# Patient Record
Sex: Female | Born: 1990 | Race: Black or African American | Hispanic: No | Marital: Single | Smoking: Former smoker
Health system: Southern US, Community
[De-identification: ages and names within clinical notes are randomized; demographics above are authoritative.]

## PROBLEM LIST (undated history)

## (undated) ENCOUNTER — Emergency Department (HOSPITAL_COMMUNITY): Admission: EM | Payer: Medicaid Other

## (undated) ENCOUNTER — Inpatient Hospital Stay (HOSPITAL_COMMUNITY): Payer: Self-pay

## (undated) DIAGNOSIS — B977 Papillomavirus as the cause of diseases classified elsewhere: Secondary | ICD-10-CM

## (undated) DIAGNOSIS — K219 Gastro-esophageal reflux disease without esophagitis: Secondary | ICD-10-CM

## (undated) DIAGNOSIS — O139 Gestational [pregnancy-induced] hypertension without significant proteinuria, unspecified trimester: Secondary | ICD-10-CM

## (undated) DIAGNOSIS — R51 Headache: Secondary | ICD-10-CM

## (undated) DIAGNOSIS — F32A Depression, unspecified: Secondary | ICD-10-CM

## (undated) DIAGNOSIS — F419 Anxiety disorder, unspecified: Secondary | ICD-10-CM

## (undated) DIAGNOSIS — R87619 Unspecified abnormal cytological findings in specimens from cervix uteri: Secondary | ICD-10-CM

## (undated) DIAGNOSIS — R87629 Unspecified abnormal cytological findings in specimens from vagina: Secondary | ICD-10-CM

## (undated) DIAGNOSIS — N390 Urinary tract infection, site not specified: Secondary | ICD-10-CM

## (undated) DIAGNOSIS — O24419 Gestational diabetes mellitus in pregnancy, unspecified control: Secondary | ICD-10-CM

## (undated) DIAGNOSIS — D649 Anemia, unspecified: Secondary | ICD-10-CM

## (undated) DIAGNOSIS — IMO0002 Reserved for concepts with insufficient information to code with codable children: Secondary | ICD-10-CM

## (undated) DIAGNOSIS — A599 Trichomoniasis, unspecified: Secondary | ICD-10-CM

## (undated) DIAGNOSIS — F329 Major depressive disorder, single episode, unspecified: Secondary | ICD-10-CM

## (undated) HISTORY — DX: Gestational diabetes mellitus in pregnancy, unspecified control: O24.419

## (undated) HISTORY — DX: Anemia, unspecified: D64.9

## (undated) HISTORY — DX: Anxiety disorder, unspecified: F41.9

---
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
## 2011-01-05 LAB — WET PREP, GENITAL

## 2011-01-06 LAB — URINALYSIS, ROUTINE W REFLEX MICROSCOPIC
Nitrite: NEGATIVE
Specific Gravity, Urine: >1.03 — ABNORMAL HIGH (ref 1.005–1.030)
Urobilinogen, UA: 0.2 mg/dL (ref 0.0–1.0)
pH: 5.5 (ref 5.0–8.0)

## 2011-01-06 LAB — URINE CULTURE

## 2011-01-06 LAB — POCT PREGNANCY, URINE: Preg Test, Ur: POSITIVE

## 2011-01-06 LAB — URINE MICROSCOPIC-ADD ON

## 2011-01-06 LAB — HCG, QUANTITATIVE, PREGNANCY: hCG, Beta Chain, Quant, S: 8566 m[IU]/mL — ABNORMAL HIGH (ref <5)

## 2011-01-06 LAB — WET PREP, GENITAL

## 2011-01-26 ENCOUNTER — Inpatient Hospital Stay (HOSPITAL_COMMUNITY)
Admission: AD | Admit: 2011-01-26 | Discharge: 2011-01-26 | Disposition: A | Discharge status: Home / Self Care | Payer: Medicaid Other | Source: Ambulatory Visit | Attending: Obstetrics and Gynecology | Admitting: Obstetrics and Gynecology

## 2011-01-26 DIAGNOSIS — O98819 Other maternal infectious and parasitic diseases complicating pregnancy, unspecified trimester: Secondary | ICD-10-CM

## 2011-01-26 DIAGNOSIS — G43909 Migraine, unspecified, not intractable, without status migrainosus: Secondary | ICD-10-CM | POA: Insufficient documentation

## 2011-01-26 DIAGNOSIS — A5901 Trichomonal vulvovaginitis: Secondary | ICD-10-CM | POA: Insufficient documentation

## 2011-01-26 LAB — URINALYSIS, ROUTINE W REFLEX MICROSCOPIC
Bilirubin Urine: NEGATIVE
Glucose, UA: NEGATIVE mg/dL
Ketones, ur: NEGATIVE mg/dL
Nitrite: NEGATIVE
Specific Gravity, Urine: 1.025 (ref 1.005–1.030)
pH: 6.5 (ref 5.0–8.0)

## 2011-01-26 LAB — URINE MICROSCOPIC-ADD ON

## 2011-01-27 ENCOUNTER — Other Ambulatory Visit (HOSPITAL_COMMUNITY): Payer: Self-pay | Admitting: Obstetrics and Gynecology

## 2011-01-27 DIAGNOSIS — Z3689 Encounter for other specified antenatal screening: Secondary | ICD-10-CM

## 2011-01-27 LAB — URINE CULTURE

## 2011-02-02 ENCOUNTER — Ambulatory Visit (HOSPITAL_COMMUNITY): Payer: Medicaid Other

## 2011-02-08 ENCOUNTER — Ambulatory Visit (HOSPITAL_COMMUNITY): Payer: Medicaid Other | Attending: Obstetrics and Gynecology
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
## 2011-02-19 NOTE — Group Therapy Note (Signed)
Kristin Powers, MIU NO.:  [id number]   MEDICAL RECORD NO.:  [id number]          PATIENT TYPE:  WOC   LOCATION:  [facility]                   FACILITY:  [facility]   PHYSICIAN:  Ellis Parents, MD    DATE OF BIRTH:  05/28/1991   DATE OF SERVICE:  02/09/2006                                    CLINIC NOTE   This 20 year old nulliparous female presents with abnormal uterine bleeding  approximately one month duration.  The patient was started on Depo-Provera  approximately three months ago because of progressive dysmenorrhea, although  her menses were irregular.  To date she has received three injections of  Depo, the last one being mid February 2007.  She began bleeding a month ago  and was put on an OC and is still bleeding using one or two pads a day.  The  patient has also had some crampy mid abdominal pains associated with nausea,  vomiting, and occasional diarrhea.  The patient denies any fever and she is  sexually active.   PAST MEDICAL HISTORY:  Patient denies any previous history of surgery or  allergies.  Past medical history of GERD.   PHYSICAL EXAMINATION:  ABDOMEN:  Soft and nontender.  PELVIC:  External genitalia are normal.  The vagina contains a small amount  of blood.  The cervix is nulliparous and clean.  The uterus is in mid  position, normal in size, nontender.  Both adnexa soft without tenderness,  masses, nodularity, or induration.   Abnormal uterine bleeding secondary to Provera and OC.  The patient is to  discontinue the OC and start on Premarin 0.6 mg daily.  She is given 45.  The patient's mother was advised to continue the Premarin until the bleeding  stops and the patient can start on OCs if desired for contraception.  A  GC/Chlamydia culture is obtained.           ______________________________  Ellis Parents, MD     SA/MEDQ  D:  02/09/2006  T:  02/10/2006  Job:  [id number]

## 2011-03-06 ENCOUNTER — Inpatient Hospital Stay (HOSPITAL_COMMUNITY)
Admission: EM | Admit: 2011-03-06 | Discharge: 2011-03-06 | Disposition: A | Discharge status: Home / Self Care | Payer: Medicaid Other | Source: Ambulatory Visit | Attending: Obstetrics and Gynecology | Admitting: Obstetrics and Gynecology

## 2011-03-06 DIAGNOSIS — O2390 Unspecified genitourinary tract infection in pregnancy, unspecified trimester: Secondary | ICD-10-CM | POA: Insufficient documentation

## 2011-03-06 DIAGNOSIS — O4700 False labor before 37 completed weeks of gestation, unspecified trimester: Secondary | ICD-10-CM

## 2011-03-06 DIAGNOSIS — N390 Urinary tract infection, site not specified: Secondary | ICD-10-CM

## 2011-03-06 LAB — URINALYSIS, ROUTINE W REFLEX MICROSCOPIC
Nitrite: NEGATIVE
Specific Gravity, Urine: 1.02 (ref 1.005–1.030)
Urobilinogen, UA: 0.2 mg/dL (ref 0.0–1.0)
pH: 7 (ref 5.0–8.0)

## 2011-03-06 LAB — URINE MICROSCOPIC-ADD ON

## 2011-03-07 LAB — RH IMMUNE GLOBULIN WORKUP (NOT WOMEN'S HOSP)
ABO/RH(D): O NEG
Antibody Screen: NEGATIVE
Unit division: 0

## 2011-03-07 LAB — URINE CULTURE

## 2011-03-08 LAB — STREP B DNA PROBE: Strep Group B Ag: NEGATIVE

## 2011-03-08 LAB — GC/CHLAMYDIA PROBE AMP, GENITAL: GC Probe Amp, Genital: NEGATIVE

## 2011-03-19 ENCOUNTER — Inpatient Hospital Stay (HOSPITAL_COMMUNITY)
Admission: AD | Admit: 2011-03-19 | Discharge: 2011-03-19 | Disposition: A | Discharge status: Home / Self Care | Payer: Medicaid Other | Source: Ambulatory Visit | Attending: Obstetrics & Gynecology | Admitting: Obstetrics & Gynecology

## 2011-03-19 ENCOUNTER — Inpatient Hospital Stay (HOSPITAL_COMMUNITY): Payer: Medicaid Other

## 2011-03-19 DIAGNOSIS — O479 False labor, unspecified: Secondary | ICD-10-CM

## 2011-03-19 DIAGNOSIS — N390 Urinary tract infection, site not specified: Secondary | ICD-10-CM

## 2011-03-19 DIAGNOSIS — O2390 Unspecified genitourinary tract infection in pregnancy, unspecified trimester: Secondary | ICD-10-CM | POA: Insufficient documentation

## 2011-03-19 LAB — URINE MICROSCOPIC-ADD ON

## 2011-03-19 LAB — WET PREP, GENITAL
Clue Cells Wet Prep HPF POC: NONE SEEN
Trich, Wet Prep: NONE SEEN
Yeast Wet Prep HPF POC: NONE SEEN

## 2011-03-19 LAB — URINALYSIS, ROUTINE W REFLEX MICROSCOPIC
Glucose, UA: NEGATIVE mg/dL
Hgb urine dipstick: NEGATIVE
Ketones, ur: NEGATIVE mg/dL
Protein, ur: NEGATIVE mg/dL
Urobilinogen, UA: 0.2 mg/dL (ref 0.0–1.0)

## 2011-03-20 LAB — URINE CULTURE: Culture: NO GROWTH

## 2011-03-20 LAB — GC/CHLAMYDIA PROBE AMP, GENITAL: GC Probe Amp, Genital: NEGATIVE

## 2011-03-22 ENCOUNTER — Inpatient Hospital Stay (HOSPITAL_COMMUNITY)
Admission: AD | Admit: 2011-03-22 | Discharge: 2011-03-22 | Disposition: A | Discharge status: Home / Self Care | Payer: Medicaid Other | Source: Ambulatory Visit | Attending: Obstetrics & Gynecology | Admitting: Obstetrics & Gynecology

## 2011-03-22 DIAGNOSIS — O368390 Maternal care for abnormalities of the fetal heart rate or rhythm, unspecified trimester, not applicable or unspecified: Secondary | ICD-10-CM | POA: Insufficient documentation

## 2011-03-24 ENCOUNTER — Other Ambulatory Visit: Payer: Self-pay | Admitting: Obstetrics and Gynecology

## 2011-03-24 DIAGNOSIS — O365990 Maternal care for other known or suspected poor fetal growth, unspecified trimester, not applicable or unspecified: Secondary | ICD-10-CM

## 2011-03-24 DIAGNOSIS — O0930 Supervision of pregnancy with insufficient antenatal care, unspecified trimester: Secondary | ICD-10-CM

## 2011-03-24 DIAGNOSIS — O99340 Other mental disorders complicating pregnancy, unspecified trimester: Secondary | ICD-10-CM

## 2011-03-24 LAB — POCT URINALYSIS DIP (DEVICE)
Bilirubin Urine: NEGATIVE
Glucose, UA: NEGATIVE mg/dL
Ketones, ur: NEGATIVE mg/dL
Protein, ur: 30 mg/dL — AB
Specific Gravity, Urine: 1.02 (ref 1.005–1.030)

## 2011-03-31 ENCOUNTER — Other Ambulatory Visit: Payer: Self-pay | Admitting: Family Medicine

## 2011-03-31 ENCOUNTER — Ambulatory Visit (HOSPITAL_COMMUNITY)
Admission: RE | Admit: 2011-03-31 | Discharge: 2011-03-31 | Disposition: A | Discharge status: Home / Self Care | Payer: Medicaid Other | Source: Ambulatory Visit | Attending: Obstetrics and Gynecology | Admitting: Obstetrics and Gynecology

## 2011-03-31 ENCOUNTER — Other Ambulatory Visit: Payer: Self-pay | Admitting: Obstetrics and Gynecology

## 2011-03-31 DIAGNOSIS — O09299 Supervision of pregnancy with other poor reproductive or obstetric history, unspecified trimester: Secondary | ICD-10-CM | POA: Insufficient documentation

## 2011-03-31 DIAGNOSIS — O365990 Maternal care for other known or suspected poor fetal growth, unspecified trimester, not applicable or unspecified: Secondary | ICD-10-CM

## 2011-03-31 DIAGNOSIS — O0930 Supervision of pregnancy with insufficient antenatal care, unspecified trimester: Secondary | ICD-10-CM

## 2011-03-31 LAB — POCT URINALYSIS DIP (DEVICE)
Glucose, UA: NEGATIVE mg/dL
Specific Gravity, Urine: 1.025 (ref 1.005–1.030)
Urobilinogen, UA: 0.2 mg/dL (ref 0.0–1.0)

## 2011-04-04 ENCOUNTER — Inpatient Hospital Stay (HOSPITAL_COMMUNITY): Admission: AD | Admit: 2011-04-04 | Payer: Self-pay | Source: Ambulatory Visit | Admitting: Obstetrics & Gynecology

## 2011-04-05 ENCOUNTER — Ambulatory Visit (HOSPITAL_COMMUNITY)
Admission: RE | Admit: 2011-04-05 | Discharge: 2011-04-05 | Disposition: A | Discharge status: Home / Self Care | Payer: Medicaid Other | Source: Ambulatory Visit | Attending: Family Medicine | Admitting: Family Medicine

## 2011-04-05 ENCOUNTER — Other Ambulatory Visit: Payer: Self-pay | Admitting: Obstetrics and Gynecology

## 2011-04-05 DIAGNOSIS — O0930 Supervision of pregnancy with insufficient antenatal care, unspecified trimester: Secondary | ICD-10-CM | POA: Insufficient documentation

## 2011-04-05 DIAGNOSIS — O365990 Maternal care for other known or suspected poor fetal growth, unspecified trimester, not applicable or unspecified: Secondary | ICD-10-CM

## 2011-04-05 DIAGNOSIS — O09299 Supervision of pregnancy with other poor reproductive or obstetric history, unspecified trimester: Secondary | ICD-10-CM | POA: Insufficient documentation

## 2011-04-05 LAB — POCT URINALYSIS DIP (DEVICE)
Bilirubin Urine: NEGATIVE
Glucose, UA: NEGATIVE mg/dL
Hgb urine dipstick: NEGATIVE
Ketones, ur: NEGATIVE mg/dL
Nitrite: NEGATIVE
pH: 6 (ref 5.0–8.0)

## 2011-04-07 ENCOUNTER — Inpatient Hospital Stay (HOSPITAL_COMMUNITY)
Admission: RE | Admit: 2011-04-07 | Discharge: 2011-04-09 | DRG: 775 | Disposition: A | Discharge status: Home / Self Care | Payer: Medicaid Other | Source: Ambulatory Visit | Attending: Family Medicine | Admitting: Family Medicine

## 2011-04-07 ENCOUNTER — Inpatient Hospital Stay (HOSPITAL_COMMUNITY): Payer: Medicaid Other | Admitting: Family Medicine

## 2011-04-07 DIAGNOSIS — Z22330 Carrier of Group B streptococcus: Secondary | ICD-10-CM

## 2011-04-07 DIAGNOSIS — O99892 Other specified diseases and conditions complicating childbirth: Secondary | ICD-10-CM | POA: Diagnosis present

## 2011-04-07 DIAGNOSIS — O9989 Other specified diseases and conditions complicating pregnancy, childbirth and the puerperium: Secondary | ICD-10-CM

## 2011-04-07 LAB — CBC
MCHC: 32.5 g/dL (ref 30.0–36.0)
MCV: 86.4 fL (ref 78.0–100.0)
Platelets: 189 10*3/uL (ref 150–400)
RBC: 3.81 MIL/uL — ABNORMAL LOW (ref 3.87–5.11)
RDW: 13.9 % (ref 11.5–15.5)

## 2011-04-11 ENCOUNTER — Inpatient Hospital Stay (HOSPITAL_COMMUNITY)
Admission: AD | Admit: 2011-04-11 | Discharge: 2011-04-11 | Disposition: A | Discharge status: Home / Self Care | Payer: Medicaid Other | Source: Ambulatory Visit | Attending: Obstetrics & Gynecology | Admitting: Obstetrics & Gynecology

## 2011-04-11 ENCOUNTER — Inpatient Hospital Stay (HOSPITAL_COMMUNITY): Payer: Medicaid Other | Admitting: Obstetrics & Gynecology

## 2011-04-11 DIAGNOSIS — Z2989 Encounter for other specified prophylactic measures: Secondary | ICD-10-CM | POA: Insufficient documentation

## 2011-04-11 DIAGNOSIS — IMO0002 Reserved for concepts with insufficient information to code with codable children: Secondary | ICD-10-CM

## 2011-04-11 DIAGNOSIS — Z298 Encounter for other specified prophylactic measures: Secondary | ICD-10-CM | POA: Insufficient documentation

## 2011-04-11 MED ORDER — RHO D IMMUNE GLOBULIN 1500 UNIT/2ML IJ SOLN
300.0000 ug | Freq: Once | INTRAMUSCULAR | Status: AC
  Administered 2011-04-11: 300 ug via INTRAMUSCULAR

## 2011-04-11 NOTE — Progress Notes (Signed)
Patient was discharge from [facility] without receiving Rhophylac injection, presented today for injection

## 2011-04-12 LAB — RH IMMUNE GLOB WKUP(>/=20WKS)(NOT WOMEN'S HOSP): Fetal Screen: NEGATIVE

## 2011-04-26 ENCOUNTER — Inpatient Hospital Stay (HOSPITAL_COMMUNITY)
Admission: AD | Admit: 2011-04-26 | Discharge: 2011-04-26 | Disposition: A | Discharge status: Home / Self Care | Payer: Medicaid Other | Source: Ambulatory Visit | Attending: Obstetrics & Gynecology | Admitting: Obstetrics & Gynecology

## 2011-04-26 ENCOUNTER — Encounter (HOSPITAL_COMMUNITY): Payer: Self-pay | Admitting: *Deleted

## 2011-04-26 DIAGNOSIS — G43109 Migraine with aura, not intractable, without status migrainosus: Secondary | ICD-10-CM | POA: Insufficient documentation

## 2011-04-26 DIAGNOSIS — F3289 Other specified depressive episodes: Secondary | ICD-10-CM | POA: Insufficient documentation

## 2011-04-26 DIAGNOSIS — O99345 Other mental disorders complicating the puerperium: Secondary | ICD-10-CM

## 2011-04-26 DIAGNOSIS — O9989 Other specified diseases and conditions complicating pregnancy, childbirth and the puerperium: Secondary | ICD-10-CM

## 2011-04-26 DIAGNOSIS — O99893 Other specified diseases and conditions complicating puerperium: Secondary | ICD-10-CM | POA: Insufficient documentation

## 2011-04-26 DIAGNOSIS — F329 Major depressive disorder, single episode, unspecified: Secondary | ICD-10-CM

## 2011-04-26 HISTORY — DX: Major depressive disorder, single episode, unspecified: F32.9

## 2011-04-26 HISTORY — DX: Unspecified abnormal cytological findings in specimens from cervix uteri: R87.619

## 2011-04-26 HISTORY — DX: Trichomoniasis, unspecified: A59.9

## 2011-04-26 HISTORY — DX: Gestational (pregnancy-induced) hypertension without significant proteinuria, unspecified trimester: O13.9

## 2011-04-26 HISTORY — DX: Headache: R51

## 2011-04-26 HISTORY — DX: Papillomavirus as the cause of diseases classified elsewhere: B97.7

## 2011-04-26 HISTORY — DX: Depression, unspecified: F32.A

## 2011-04-26 HISTORY — DX: Reserved for concepts with insufficient information to code with codable children: IMO0002

## 2011-04-26 HISTORY — DX: Urinary tract infection, site not specified: N39.0

## 2011-04-26 MED ORDER — BUTALBITAL-APAP-CAFFEINE 50-325-40 MG PO TABS
2.0000 | ORAL_TABLET | Freq: Once | ORAL | Status: AC
  Administered 2011-04-26: 2 via ORAL
  Filled 2011-04-26: qty 2

## 2011-04-26 MED ORDER — BUPROPION HCL 100 MG PO TABS: ORAL_TABLET | ORAL | Status: DC

## 2011-04-26 MED ORDER — BUTALBITAL-APAP-CAFFEINE 50-325-40 MG PO TABS: 1.0000 | ORAL_TABLET | Freq: Four times a day (QID) | ORAL | Status: DC | PRN

## 2011-04-26 NOTE — ED Provider Notes (Signed)
History   The pt is a 20 year-old G2P2 19 days PP who presents to [facility] reporting PP depression not relieved by Zoloft. She ran out of her medication a week ago and stopped without tapering. She denies SI/HI or any significant exacerbation of Sx since quitting, but reports depression, short temper and "bad feelings" toward her older child and repetitive behaviors including twisting/pulling at her hair "picking" at her eyes.   Chief Complaint  Patient presents with  . Depression   HPI  Past Medical History  Diagnosis Date  . Pregnancy induced hypertension   . Depression   . Headache   . Asthma   . Urinary tract infection   . Abnormal Pap smear   . Human papilloma virus   . Trichomonas     Past Surgical History  Procedure Date  . No past surgeries     No family history on file.  History  Substance Use Topics  . Smoking status: Former Smoker    Types: Cigarettes    Quit date: 07/26/2010  . Smokeless tobacco: Never Used  . Alcohol Use: No    OB History    Grav Para Term Preterm Abortions TAB SAB Ect Mult Living   2 2 1 1  0 0 0 0 0 2      Review of Systems  Constitutional: Negative.   Eyes: Negative for visual disturbance.  Gastrointestinal: Negative for abdominal pain.  Neurological: Positive for headaches (chronic H/A's--dulll, constant, R temporal assoc w/ nausea, sensitivitie to stimulation and aura. More frequent since delivery. No resolution w/ ibuprofen).  Psychiatric/Behavioral: Negative for suicidal ideas and self-injury. The patient is nervous/anxious.     Physical Exam  BP 125/69  Pulse 65  Temp(Src) 98.1 F (36.7 C) (Oral)  Resp 18  Ht 5\' 2"  (1.575 m)  Wt 79.107 kg (174 lb 6.4 oz)  BMI 31.90 kg/m2  LMP 07/06/2010  Breastfeeding? Unknown  Physical Exam  Constitutional: She appears well-developed and well-nourished. She appears distressed (tearful).  Cardiovascular: Normal rate.   Pulmonary/Chest: Effort normal.  Neurological: She has normal  reflexes.  Skin: Skin is warm and dry.  Psychiatric: Thought content is not paranoid and not delusional. She exhibits a depressed mood. She expresses no homicidal and no suicidal ideation. She expresses no suicidal plans and no homicidal plans.    ED Course  Procedures  Assessment: 1. PP Depression not well-controlled w/ Zoloft 2. Migraines w/ aura  Plan: 1. D/C home 2. 2 Fiorcet now, Rx given 3. Rx Wellbutrin 100 BID x 3 days , then TID, #90 w. 1 RF 4. F/U w/ Family Medicine. Contact info given. 5, Support given 6. Discussed support groups, tapping social support, [facility] for emergency mental health services

## 2011-04-26 NOTE — ED Notes (Signed)
Had doubled up on zoloft (started in hosp after del).  Has taken in a wk because it wasn't working.

## 2011-04-26 NOTE — ED Notes (Signed)
Does not have help at home.  FOB not supportive or involved.  Denies family support, states really doesn't have family.  Discussed availability of baby blue, mom support group through hosp- pamphlet given.  States does not feel she would harm herself or the children

## 2011-04-26 NOTE — Progress Notes (Signed)
Suffering from p.p depression, states zoloft is not working.

## 2011-06-01 ENCOUNTER — Other Ambulatory Visit: Payer: Self-pay | Admitting: Family

## 2011-06-25 LAB — CLOSTRIDIUM DIFFICILE EIA

## 2011-06-25 LAB — URINE CULTURE: Colony Count: >=100000

## 2011-06-25 LAB — STOOL CULTURE

## 2011-06-25 LAB — URINE MICROSCOPIC-ADD ON

## 2011-06-25 LAB — URINALYSIS, ROUTINE W REFLEX MICROSCOPIC
Ketones, ur: 15 — AB
Nitrite: NEGATIVE
pH: 5.5

## 2011-06-25 LAB — GIARDIA/CRYPTOSPORIDIUM SCREEN(EIA): Giardia Screen - EIA: NEGATIVE

## 2011-06-28 LAB — URINALYSIS, ROUTINE W REFLEX MICROSCOPIC
Bilirubin Urine: NEGATIVE
Glucose, UA: NEGATIVE
Ketones, ur: NEGATIVE
Nitrite: NEGATIVE
Protein, ur: 30 — AB
Specific Gravity, Urine: 1.026
Urobilinogen, UA: 0.2
pH: 6

## 2011-06-28 LAB — URINE MICROSCOPIC-ADD ON

## 2011-06-28 LAB — URINE CULTURE

## 2011-06-28 LAB — WET PREP, GENITAL
Trich, Wet Prep: NONE SEEN
Yeast Wet Prep HPF POC: NONE SEEN

## 2011-07-01 ENCOUNTER — Emergency Department (HOSPITAL_COMMUNITY)
Admission: EM | Admit: 2011-07-01 | Discharge: 2011-07-01 | Discharge status: Against Medical Advice | Payer: Medicaid Other | Attending: Emergency Medicine | Admitting: Emergency Medicine

## 2011-07-01 DIAGNOSIS — R109 Unspecified abdominal pain: Secondary | ICD-10-CM | POA: Insufficient documentation

## 2011-07-16 LAB — GC/CHLAMYDIA PROBE AMP, GENITAL: Chlamydia, DNA Probe: NEGATIVE

## 2011-07-16 LAB — POCT PREGNANCY, URINE: Preg Test, Ur: NEGATIVE

## 2011-07-16 LAB — WET PREP, GENITAL
Trich, Wet Prep: NONE SEEN
Yeast Wet Prep HPF POC: NONE SEEN

## 2011-07-19 LAB — URINALYSIS, ROUTINE W REFLEX MICROSCOPIC
Leukocytes, UA: NEGATIVE
Nitrite: NEGATIVE
Specific Gravity, Urine: 1.021
Urobilinogen, UA: 1

## 2011-07-19 LAB — TSH: TSH: 2.232

## 2011-07-19 LAB — DRUGS OF ABUSE SCREEN W/O ALC, ROUTINE URINE
Amphetamine Screen, Ur: NEGATIVE
Benzodiazepines.: NEGATIVE
Creatinine,U: 153
Marijuana Metabolite: POSITIVE — AB
Propoxyphene: NEGATIVE

## 2011-07-19 LAB — DIFFERENTIAL
Basophils Absolute: 0.1
Lymphocytes Relative: 29 — ABNORMAL LOW
Lymphs Abs: 2.4
Neutrophils Relative %: 63

## 2011-07-19 LAB — HEPATIC FUNCTION PANEL
ALT: 18
Alkaline Phosphatase: 63
Bilirubin, Direct: <0.1
Total Bilirubin: 0.6

## 2011-07-19 LAB — GAMMA GT: GGT: 35

## 2011-07-19 LAB — URINE MICROSCOPIC-ADD ON

## 2011-07-19 LAB — PREGNANCY, URINE: Preg Test, Ur: NEGATIVE

## 2011-07-19 LAB — CBC
Platelets: 342
RDW: 12.9
WBC: 8.4

## 2011-07-19 LAB — T4, FREE: Free T4: 0.98

## 2011-08-05 NOTE — Telephone Encounter (Signed)
Called in to pharmacy on 08/05/11

## 2011-10-15 ENCOUNTER — Encounter (HOSPITAL_COMMUNITY): Payer: Self-pay | Admitting: *Deleted

## 2011-10-15 ENCOUNTER — Emergency Department (HOSPITAL_COMMUNITY)
Admission: EM | Admit: 2011-10-15 | Discharge: 2011-10-16 | Disposition: A | Discharge status: Home / Self Care | Payer: Self-pay | Attending: Emergency Medicine | Admitting: Emergency Medicine

## 2011-10-15 DIAGNOSIS — B9689 Other specified bacterial agents as the cause of diseases classified elsewhere: Secondary | ICD-10-CM | POA: Insufficient documentation

## 2011-10-15 DIAGNOSIS — O2390 Unspecified genitourinary tract infection in pregnancy, unspecified trimester: Secondary | ICD-10-CM | POA: Insufficient documentation

## 2011-10-15 DIAGNOSIS — O219 Vomiting of pregnancy, unspecified: Secondary | ICD-10-CM

## 2011-10-15 DIAGNOSIS — J3489 Other specified disorders of nose and nasal sinuses: Secondary | ICD-10-CM | POA: Insufficient documentation

## 2011-10-15 DIAGNOSIS — N760 Acute vaginitis: Secondary | ICD-10-CM | POA: Insufficient documentation

## 2011-10-15 DIAGNOSIS — J45909 Unspecified asthma, uncomplicated: Secondary | ICD-10-CM | POA: Insufficient documentation

## 2011-10-15 DIAGNOSIS — Z3201 Encounter for pregnancy test, result positive: Secondary | ICD-10-CM | POA: Insufficient documentation

## 2011-10-15 DIAGNOSIS — Z331 Pregnant state, incidental: Secondary | ICD-10-CM

## 2011-10-15 DIAGNOSIS — O210 Mild hyperemesis gravidarum: Secondary | ICD-10-CM | POA: Insufficient documentation

## 2011-10-15 DIAGNOSIS — A499 Bacterial infection, unspecified: Secondary | ICD-10-CM | POA: Insufficient documentation

## 2011-10-15 LAB — PREGNANCY, URINE: Preg Test, Ur: POSITIVE

## 2011-10-15 MED ORDER — IBUPROFEN 800 MG PO TABS
800.0000 mg | ORAL_TABLET | Freq: Once | ORAL | Status: AC
  Administered 2011-10-15: 800 mg via ORAL
  Filled 2011-10-15: qty 1

## 2011-10-15 MED ORDER — ONDANSETRON 4 MG PO TBDP
8.0000 mg | ORAL_TABLET | Freq: Once | ORAL | Status: AC
  Administered 2011-10-15: 8 mg via ORAL
  Filled 2011-10-15: qty 2

## 2011-10-15 NOTE — ED Notes (Signed)
Patient states started vomiting today and it happens every 20 minutes. States had an episode of the same 2 weeks ago and thought it was a 24 hour virus but it is back. A&Ox3 Resp even unlabored and relaxed.

## 2011-10-15 NOTE — ED Notes (Signed)
She has had a cold and chest congestion for 2-3 weeks intermittently.  She has sinus congestion also.  C/o vomiting and weakness

## 2011-10-15 NOTE — ED Provider Notes (Signed)
History     CSN: [id number]  Arrival date & time 10/15/11  2129   First MD Initiated Contact with Patient 10/15/11 2156      Chief Complaint  Patient presents with  . Emesis    (Consider location/radiation/quality/duration/timing/severity/associated sxs/prior treatment) HPI Comments: Patient reports she has had nausea and vomiting every 20 minutes today.  Thinks she may be pregnant.  States she has had nasal congestion and a "head cold" that began yesterday.  She had vomiting a few days ago, and today has had vomiting all day.  LMP was sometime last month and she has stopped using her birth control.  Pt is G2P2002.  States she has had abnormal malodorous vaginal discharge x 1 month.  Denies abdominal pain, vaginal bleeding.    Patient is a 21 y.o. female presenting with vomiting. The history is provided by the patient.  Emesis  Pertinent negatives include no abdominal pain, no diarrhea, no fever and no headaches.    Past Medical History  Diagnosis Date  . Pregnancy induced hypertension   . Depression   . Headache   . Asthma   . Urinary tract infection   . Abnormal Pap smear   . Human papilloma virus   . Trichomonas     Past Surgical History  Procedure Date  . No past surgeries     History reviewed. No pertinent family history.  History  Substance Use Topics  . Smoking status: Former Smoker    Types: Cigarettes    Quit date: 07/26/2010  . Smokeless tobacco: Never Used  . Alcohol Use: No    OB History    Grav Para Term Preterm Abortions TAB SAB Ect Mult Living   2 2 1 1  0 0 0 0 0 2      Review of Systems  Constitutional: Negative for fever.  Respiratory: Negative for shortness of breath.   Gastrointestinal: Positive for nausea and vomiting. Negative for abdominal pain, diarrhea and constipation.  Genitourinary: Negative for dysuria, frequency, vaginal bleeding and vaginal pain.  Musculoskeletal: Negative for back pain.  Neurological: Negative for headaches.   All other systems reviewed and are negative.    Allergies  Review of patient's allergies indicates no known allergies.  Home Medications   Current Outpatient Rx  Name Route Sig Dispense Refill  . VENTOLIN HFA 108 (90 BASE) MCG/ACT IN AERS  inhale 2 puffs by mouth every 6 hours if needed 18 g 0    BP 154/78  Pulse 97  Temp(Src) 98.9 F (37.2 C) (Oral)  Resp 16  SpO2 100%  Breastfeeding? Unknown  Physical Exam  Nursing note and vitals reviewed. Constitutional: She is oriented to person, place, and time. She appears well-developed and well-nourished.  HENT:  Head: Normocephalic and atraumatic.  Neck: Neck supple.  Cardiovascular: Normal rate, regular rhythm and normal heart sounds.   Pulmonary/Chest: Breath sounds normal. No respiratory distress. She has no wheezes. She has no rales. She exhibits no tenderness.  Abdominal: Soft. Bowel sounds are normal. She exhibits no distension and no mass. There is no tenderness. There is no rebound and no guarding.  Genitourinary: Uterus normal. Uterus is not tender. Cervix exhibits no motion tenderness, no discharge and no friability. Right adnexum displays no mass, no tenderness and no fullness. Left adnexum displays no mass, no tenderness and no fullness. Vaginal discharge found.       White malodorous discharge in vagina.   Neurological: She is alert and oriented to person, place, and time.  ED Course  Procedures (including critical care time)  Urine pregnancy is positive. Patient has no abdominal pain or vaginal bleeding.  Labs Reviewed  WET PREP, GENITAL - Abnormal; Notable for the following:    Clue Cells, Wet Prep MANY (*)    WBC, Wet Prep HPF POC MODERATE (*)    All other components within normal limits  PREGNANCY, URINE  GC/CHLAMYDIA PROBE AMP, GENITAL  URINALYSIS, ROUTINE W REFLEX MICROSCOPIC  URINE CULTURE   No results found.   1. Pregnancy as incidental finding   2. Nausea/vomiting in pregnancy   3. Bacterial  vaginosis       MDM  Patient with N/V, found to be pregnant.  +BV Pelvic exam otherwise unremarkable.  No cervical motion tenderness, no clinical evidence of PID or cervicitis.  Urinalysis pending.  Patient signed out to Dr Hyacinth Meeker pending UA results.          Dillard Cannon Frank, [place] 10/16/11 [phone]

## 2011-10-15 NOTE — ED Notes (Signed)
PATIENT GIVEN GINGERALE FOR FLUID CHALLANGE

## 2011-10-16 LAB — URINALYSIS, ROUTINE W REFLEX MICROSCOPIC
Glucose, UA: NEGATIVE mg/dL
Ketones, ur: >80 mg/dL — AB
Nitrite: NEGATIVE
Protein, ur: 100 mg/dL — AB
pH: 6 (ref 5.0–8.0)

## 2011-10-16 LAB — URINE MICROSCOPIC-ADD ON

## 2011-10-16 LAB — WET PREP, GENITAL
Trich, Wet Prep: NONE SEEN
Yeast Wet Prep HPF POC: NONE SEEN

## 2011-10-16 LAB — URINE CULTURE: Colony Count: 65000

## 2011-10-16 MED ORDER — ONDANSETRON HCL 4 MG PO TABS: 4.0000 mg | ORAL_TABLET | Freq: Three times a day (TID) | ORAL | Status: DC | PRN

## 2011-10-16 MED ORDER — ONDANSETRON 4 MG PO TBDP: 4.0000 mg | ORAL_TABLET | Freq: Three times a day (TID) | ORAL | Status: AC | PRN

## 2011-10-16 MED ORDER — CEPHALEXIN 500 MG PO CAPS: 500.0000 mg | ORAL_CAPSULE | Freq: Three times a day (TID) | ORAL | Status: AC

## 2011-10-16 MED ORDER — PRENATAL RX 60-1 MG PO TABS: 1.0000 | ORAL_TABLET | Freq: Every day | ORAL | Status: DC

## 2011-10-16 MED ORDER — ONDANSETRON HCL 4 MG/2ML IJ SOLN
4.0000 mg | Freq: Once | INTRAMUSCULAR | Status: AC
  Administered 2011-10-16: 4 mg via INTRAVENOUS
  Filled 2011-10-16: qty 2

## 2011-10-16 MED ORDER — METRONIDAZOLE 500 MG PO TABS: 500.0000 mg | ORAL_TABLET | Freq: Two times a day (BID) | ORAL | Status: AC

## 2011-10-16 MED ORDER — SODIUM CHLORIDE 0.9 % IV BOLUS (SEPSIS)
1000.0000 mL | Freq: Once | INTRAVENOUS | Status: AC
  Administered 2011-10-16: 1000 mL via INTRAVENOUS

## 2011-10-16 MED ORDER — METRONIDAZOLE 500 MG PO TABS: 500.0000 mg | ORAL_TABLET | Freq: Two times a day (BID) | ORAL | Status: DC

## 2011-10-16 MED ORDER — ALBUTEROL SULFATE HFA 108 (90 BASE) MCG/ACT IN AERS: 2.0000 | INHALATION_SPRAY | RESPIRATORY_TRACT | Status: DC | PRN

## 2011-10-16 NOTE — ED Provider Notes (Signed)
Medical screening examination/treatment/procedure(s) were conducted as a shared visit with non-physician practitioner(s) and myself.  I personally evaluated the patient during the encounter  21 year old female with significant nausea vomiting, sinus drainage. This is been going on for several days, gradually getting worse. She found out today that she was pregnant. She denies urinary symptoms, rashes, chest pain, abdominal pain.  Physical exam:  Well appearing female, has already received IV fluids and antiemetics and is now tolerating fluids by mouth. Abdomen is soft, lungs are clear, no wheezing, heart is regular without murmurs. No peripheral edema, rash or focal weakness or numbness.   Assessment, patient does have a bacterial vaginosis, pregnancy confirmed, bacteriuria, and probably has sinusitis.   Plan :Patient is in good spirits, happy and amenable to discharge prescriptions for home for infections.  Encourage close followup  Vida Roller, MD 10/16/11 0230

## 2011-10-16 NOTE — ED Notes (Signed)
Rx x 5, pt voiced understanding to f/u with women[facility] clinic and finish full course of abx

## 2011-10-19 ENCOUNTER — Inpatient Hospital Stay (HOSPITAL_COMMUNITY)
Admission: AD | Admit: 2011-10-19 | Discharge: 2011-10-19 | Disposition: A | Discharge status: Home / Self Care | Payer: Medicaid Other | Source: Ambulatory Visit | Attending: Obstetrics & Gynecology | Admitting: Obstetrics & Gynecology

## 2011-10-19 ENCOUNTER — Inpatient Hospital Stay (HOSPITAL_COMMUNITY): Payer: Medicaid Other

## 2011-10-19 ENCOUNTER — Encounter (HOSPITAL_COMMUNITY): Payer: Self-pay

## 2011-10-19 DIAGNOSIS — R109 Unspecified abdominal pain: Secondary | ICD-10-CM | POA: Insufficient documentation

## 2011-10-19 DIAGNOSIS — N390 Urinary tract infection, site not specified: Secondary | ICD-10-CM

## 2011-10-19 DIAGNOSIS — O26859 Spotting complicating pregnancy, unspecified trimester: Secondary | ICD-10-CM | POA: Insufficient documentation

## 2011-10-19 HISTORY — DX: Gastro-esophageal reflux disease without esophagitis: K21.9

## 2011-10-19 LAB — CBC
HCT: 35.2 % — ABNORMAL LOW (ref 36.0–46.0)
MCHC: 33.2 g/dL (ref 30.0–36.0)
Platelets: 256 10*3/uL (ref 150–400)
RDW: 14.4 % (ref 11.5–15.5)
WBC: 7.5 10*3/uL (ref 4.0–10.5)

## 2011-10-19 LAB — URINE MICROSCOPIC-ADD ON

## 2011-10-19 LAB — ABO/RH: ABO/RH(D): O NEG

## 2011-10-19 LAB — URINALYSIS, ROUTINE W REFLEX MICROSCOPIC
Hgb urine dipstick: NEGATIVE
Leukocytes, UA: NEGATIVE
Nitrite: NEGATIVE
Protein, ur: 100 mg/dL — AB
Specific Gravity, Urine: 1.025 (ref 1.005–1.030)
Urobilinogen, UA: 1 mg/dL (ref 0.0–1.0)

## 2011-10-19 LAB — RH IG WORKUP (INCLUDES ABO/RH)
Antibody Screen: NEGATIVE
Unit division: 0

## 2011-10-19 LAB — HCG, QUANTITATIVE, PREGNANCY: hCG, Beta Chain, Quant, S: 18239 m[IU]/mL — ABNORMAL HIGH (ref <5)

## 2011-10-19 MED ORDER — RHO D IMMUNE GLOBULIN 1500 UNIT/2ML IJ SOLN
300.0000 ug | Freq: Once | INTRAMUSCULAR | Status: AC
  Administered 2011-10-19: 300 ug via INTRAMUSCULAR

## 2011-10-19 NOTE — Progress Notes (Signed)
Patient states she was seen at [facility] ED on 1-11 and had a positive pregnancy test but did not have an ultrasound. States she started having upper abdominal pain today and has had some spotting. Not wearing a pad at this time.

## 2011-10-19 NOTE — ED Provider Notes (Signed)
History     Chief Complaint  Patient presents with  . Abdominal Pain   HPI 21 y.o. [id number] at [redacted]w[redacted]d with low abd pain and spotting starting today. Diagnosed with BV and UTI at [facility] on 1/12, given rx for flagyl, keflex and zofran at that time, has not gotten those filled.    Past Medical History  Diagnosis Date  . Pregnancy induced hypertension   . Depression   . Headache   . Asthma   . Urinary tract infection   . Abnormal Pap smear   . Human papilloma virus   . Trichomonas   . Acid reflux     Past Surgical History  Procedure Date  . No past surgeries     History reviewed. No pertinent family history.  History  Substance Use Topics  . Smoking status: Former Smoker    Types: Cigarettes    Quit date: 07/26/2010  . Smokeless tobacco: Never Used  . Alcohol Use: No    Allergies: No Known Allergies  Prescriptions prior to admission  Medication Sig Dispense Refill  . albuterol (VENTOLIN HFA) 108 (90 BASE) MCG/ACT inhaler Inhale 2 puffs into the lungs every 4 (four) hours as needed for wheezing.  18 g  0  . cephALEXin (KEFLEX) 500 MG capsule Take 1 capsule (500 mg total) by mouth 3 (three) times daily.  30 capsule  0  . metroNIDAZOLE (FLAGYL) 500 MG tablet Take 1 tablet (500 mg total) by mouth 2 (two) times daily.  14 tablet  0  . ondansetron (ZOFRAN ODT) 4 MG disintegrating tablet Take 1 tablet (4 mg total) by mouth every 8 (eight) hours as needed for nausea.  10 tablet  0  . Prenatal Vit-Fe Fumarate-FA (PRENATAL MULTIVITAMIN) 60-1 MG tablet Take 1 tablet by mouth daily.  30 tablet  10    Review of Systems  Constitutional: Negative.   Respiratory: Negative.   Cardiovascular: Negative.   Gastrointestinal: Positive for abdominal pain. Negative for nausea, vomiting, diarrhea and constipation.  Genitourinary: Negative for dysuria, urgency, frequency, hematuria and flank pain.       Positive for spotting   Musculoskeletal: Negative.   Neurological: Negative.     Psychiatric/Behavioral: Negative.    Physical Exam   Blood pressure 142/66, pulse 78, temperature 99.1 F (37.3 C), temperature source Oral, resp. rate 16, height 5\' 2"  (1.575 m), weight 176 lb 6.4 oz (80.015 kg), last menstrual period 09/06/2011, SpO2 99.00%, unknown if currently breastfeeding.  Physical Exam  Nursing note and vitals reviewed. Constitutional: She is oriented to person, place, and time. She appears well-developed and well-nourished. No distress.  HENT:  Head: Normocephalic and atraumatic.  Cardiovascular: Normal rate.   Respiratory: Effort normal.  GI: Soft. Bowel sounds are normal. She exhibits no mass. There is tenderness (LUQ, LLQ). There is no rebound and no guarding.  Musculoskeletal: Normal range of motion.  Neurological: She is alert and oriented to person, place, and time.  Skin: Skin is warm and dry.  Psychiatric: She has a normal mood and affect.    MAU Course  Procedures  Results for orders placed during the hospital encounter of 10/19/11 (from the past 24 hour(s))  URINALYSIS, ROUTINE W REFLEX MICROSCOPIC     Status: Abnormal   Collection Time   10/19/11  7:05 PM      Component Value Range   Color, Urine YELLOW  YELLOW    APPearance HAZY (*) CLEAR    Specific Gravity, Urine 1.025  1.005 - 1.030  pH 7.0  5.0 - 8.0    Glucose, UA NEGATIVE  NEGATIVE (mg/dL)   Hgb urine dipstick NEGATIVE  NEGATIVE    Bilirubin Urine NEGATIVE  NEGATIVE    Ketones, ur NEGATIVE  NEGATIVE (mg/dL)   Protein, ur 161 (*) NEGATIVE (mg/dL)   Urobilinogen, UA 1.0  0.0 - 1.0 (mg/dL)   Nitrite NEGATIVE  NEGATIVE    Leukocytes, UA NEGATIVE  NEGATIVE   URINE MICROSCOPIC-ADD ON     Status: Abnormal   Collection Time   10/19/11  7:05 PM      Component Value Range   Squamous Epithelial / LPF MANY (*) RARE    WBC, UA 0-2  <3 (WBC/hpf)   Bacteria, UA FEW (*) RARE    Urine-Other MUCOUS PRESENT    ABO/RH     Status: Normal   Collection Time   10/19/11  8:10 PM      Component  Value Range   ABO/RH(D) O NEG    HCG, QUANTITATIVE, PREGNANCY     Status: Abnormal   Collection Time   10/19/11  8:10 PM      Component Value Range   hCG, Beta Chain, Quant, S 18239 (*) <5 (mIU/mL)  CBC     Status: Abnormal   Collection Time   10/19/11  8:11 PM      Component Value Range   WBC 7.5  4.0 - 10.5 (K/uL)   RBC 4.17  3.87 - 5.11 (MIL/uL)   Hemoglobin 11.7 (*) 12.0 - 15.0 (g/dL)   HCT 09.6 (*) 04.5 - 46.0 (%)   MCV 84.4  78.0 - 100.0 (fL)   MCH 28.1  26.0 - 34.0 (pg)   MCHC 33.2  30.0 - 36.0 (g/dL)   RDW 40.9  81.1 - 91.4 (%)   Platelets 256  150 - 400 (K/uL)  RH IG WORKUP, [facility]     Status: Normal (Preliminary result)   Collection Time   10/19/11  8:44 PM      Component Value Range   Gestational Age(Wks) 6.1     ABO/RH(D) O NEG     Antibody Screen NEG     Unit Number [id number]     Blood Component Type RHIG     Unit division 00     Status of Unit ISSUED     Transfusion Status OK TO TRANSFUSE     US Ob Comp Less 14 Wks  10/19/2011  *RADIOLOGY REPORT*  Clinical Data: Pelvic pain, positive pregnancy test  OBSTETRIC <14 WK ULTRASOUND  Technique:  Transabdominal ultrasound was performed for evaluation of the gestation as well as the maternal uterus and adnexal regions.  Comparison:  None.  Intrauterine gestational sac: Visualized/normal in shape. Yolk sac: Present Embryo: Present Cardiac Activity: Present Heart Rate: 117 bpm  CRL:  4 mm  6w  0d            [place] EDC: 06/13/12  Maternal uterus/Adnexae: The ovaries are normal.  IMPRESSION: Intrauterine gestational sac, yolk sac, fetal pole, and cardiac activity.  Concordant gestational age by crown-rump length compared to assigned gestational age by LMP of 6-week-1-day, [facility] by LMP 06/12/2012.  No acute abnormality.  Original Report Authenticated By: Harrel Lemon, M.D.    Assessment and Plan  21 y.o. [id number] at [redacted]w[redacted]d Spotting - blood type O neg, rhogam given tonight Pt plans on TAB Pregnancy verification given,  precautions rev'd  FRAZIER,NATALIE 10/19/2011, 9:26 PM

## 2011-10-19 NOTE — Progress Notes (Signed)
Patient states she has been on Depo with the last injection in August 2012 and has had irregular bleeding.

## 2011-10-19 NOTE — Progress Notes (Signed)
Patient is here with c/o upper abdominal soreness and lower abdominal cramping. She states that she was seen at [place] on the 11th for n/v and was given rx for zofran which she has not filled. She states that she is having vaginal spotting without a pad. She denies any vaginal discharge.

## 2011-10-30 ENCOUNTER — Encounter (HOSPITAL_COMMUNITY): Payer: Self-pay

## 2011-10-30 ENCOUNTER — Inpatient Hospital Stay (HOSPITAL_COMMUNITY)
Admission: AD | Admit: 2011-10-30 | Discharge: 2011-10-30 | Disposition: A | Discharge status: Home / Self Care | Payer: Medicaid Other | Source: Ambulatory Visit | Attending: Obstetrics & Gynecology | Admitting: Obstetrics & Gynecology

## 2011-10-30 DIAGNOSIS — N390 Urinary tract infection, site not specified: Secondary | ICD-10-CM | POA: Insufficient documentation

## 2011-10-30 DIAGNOSIS — O2390 Unspecified genitourinary tract infection in pregnancy, unspecified trimester: Secondary | ICD-10-CM | POA: Insufficient documentation

## 2011-10-30 DIAGNOSIS — O219 Vomiting of pregnancy, unspecified: Secondary | ICD-10-CM

## 2011-10-30 DIAGNOSIS — R1032 Left lower quadrant pain: Secondary | ICD-10-CM | POA: Insufficient documentation

## 2011-10-30 DIAGNOSIS — O210 Mild hyperemesis gravidarum: Secondary | ICD-10-CM | POA: Insufficient documentation

## 2011-10-30 LAB — URINALYSIS, ROUTINE W REFLEX MICROSCOPIC
Glucose, UA: NEGATIVE mg/dL
Hgb urine dipstick: NEGATIVE
Protein, ur: 100 mg/dL — AB
Urobilinogen, UA: 1 mg/dL (ref 0.0–1.0)

## 2011-10-30 LAB — URINE MICROSCOPIC-ADD ON

## 2011-10-30 MED ORDER — SODIUM CHLORIDE 0.9 % IV SOLN
25.0000 mg | Freq: Once | INTRAVENOUS | Status: AC
  Administered 2011-10-30: 25 mg via INTRAVENOUS
  Filled 2011-10-30: qty 1

## 2011-10-30 MED ORDER — PROMETHAZINE HCL 25 MG PO TABS: 25.0000 mg | ORAL_TABLET | Freq: Four times a day (QID) | ORAL | Status: DC | PRN

## 2011-10-30 MED ORDER — CEPHALEXIN 500 MG PO CAPS: 500.0000 mg | ORAL_CAPSULE | Freq: Four times a day (QID) | ORAL | Status: DC

## 2011-10-30 MED ORDER — CEPHALEXIN 500 MG PO CAPS: 500.0000 mg | ORAL_CAPSULE | Freq: Four times a day (QID) | ORAL | Status: AC

## 2011-10-30 MED ORDER — CEFAZOLIN SODIUM 1-5 GM-% IV SOLN
1.0000 g | Freq: Three times a day (TID) | INTRAVENOUS | Status: DC
  Administered 2011-10-30: 1 g via INTRAVENOUS
  Filled 2011-10-30: qty 50

## 2011-10-30 NOTE — ED Provider Notes (Signed)
History     Chief Complaint  Patient presents with  . Morning Sickness   HPI This is a 21 y.o. female at [redacted]w[redacted]d who presents with c/o nausea and vomiting of pregnancy which has worsened the past 2 days. Also has some LLQ pain. No bleeding. Was seen last week and had a viable live 6 wk pregnancy. Told the NP she was planning to terminate.  OB History    Grav Para Term Preterm Abortions TAB SAB Ect Mult Living   3 2 1 1  0 0 0 0 0 2      Past Medical History  Diagnosis Date  . Pregnancy induced hypertension   . Depression   . Headache   . Asthma   . Urinary tract infection   . Abnormal Pap smear   . Human papilloma virus   . Trichomonas   . Acid reflux     Past Surgical History  Procedure Date  . No past surgeries     History reviewed. No pertinent family history.  History  Substance Use Topics  . Smoking status: Former Smoker    Types: Cigarettes    Quit date: 07/26/2010  . Smokeless tobacco: Never Used  . Alcohol Use: No    Allergies: No Known Allergies  Prescriptions prior to admission  Medication Sig Dispense Refill  . albuterol (VENTOLIN HFA) 108 (90 BASE) MCG/ACT inhaler Inhale 2 puffs into the lungs every 4 (four) hours as needed for wheezing.  18 g  0  . Prenatal Vit-Fe Fumarate-FA (PRENATAL MULTIVITAMIN) 60-1 MG tablet Take 1 tablet by mouth daily.  30 tablet  10    ROS As above  Physical Exam   Blood pressure 111/69, pulse 60, temperature 98.8 F (37.1 C), temperature source Oral, resp. rate 16, height 5\' 2"  (1.575 m), weight 173 lb (78.472 kg), last menstrual period 09/06/2011, unknown if currently breastfeeding.  Physical Exam  Constitutional: She is oriented to person, place, and time. She appears well-developed and well-nourished. No distress.  HENT:  Head: Normocephalic.  Cardiovascular: Normal rate and regular rhythm.   Respiratory: Effort normal and breath sounds normal.  GI: Soft. She exhibits no distension and no mass. There is  tenderness (Just to left of umbilicus). There is no rebound and no guarding.  Genitourinary: No vaginal discharge found.  Musculoskeletal: Normal range of motion.  Neurological: She is alert and oriented to person, place, and time.  Skin: Skin is warm and dry.  Psychiatric: She has a normal mood and affect.    MAU Course  Procedures Given IV hydration and Phenergan. Also gave one dose of Ancef for UTI treatment to begin, since pt has had several visits which she failed to get Rx's filled. (states Medicaid might have run out, not sure, has not pursued)  Assessment and Plan  A:  IUP at [redacted]w[redacted]d      Nausea and vomiting of pregnancy (no vomiting while here)      Urinary Tract infection P:  Urine to culture.      Will d/c home with Rx Phenergan      [facility]  [facility] 10/30/2011, 1:00 PM

## 2011-10-30 NOTE — Progress Notes (Signed)
Vomiting x 3 days cramping, not taking anything for nausea. [redacted] weeks pregnant.

## 2011-10-30 NOTE — ED Provider Notes (Signed)
Attestation of Attending Supervision of Advanced Practitioner: Evaluation and management procedures were performed by the PA/NP/CNM/OB Fellow under my supervision/collaboration. Chart reviewed, and agree with management and plan.  Jaynie Collins, M.D. 10/30/2011 6:10 PM

## 2011-10-31 LAB — URINE CULTURE: Colony Count: 30000

## 2011-11-20 ENCOUNTER — Encounter (HOSPITAL_COMMUNITY): Payer: Self-pay | Admitting: *Deleted

## 2011-11-20 ENCOUNTER — Inpatient Hospital Stay (HOSPITAL_COMMUNITY)
Admission: AD | Admit: 2011-11-20 | Discharge: 2011-11-21 | Disposition: A | Discharge status: Home / Self Care | Payer: Self-pay | Source: Ambulatory Visit | Attending: Obstetrics & Gynecology | Admitting: Obstetrics & Gynecology

## 2011-11-20 DIAGNOSIS — O219 Vomiting of pregnancy, unspecified: Secondary | ICD-10-CM

## 2011-11-20 DIAGNOSIS — O210 Mild hyperemesis gravidarum: Secondary | ICD-10-CM | POA: Insufficient documentation

## 2011-11-20 LAB — URINALYSIS, ROUTINE W REFLEX MICROSCOPIC
Bilirubin Urine: NEGATIVE
Glucose, UA: NEGATIVE mg/dL
Hgb urine dipstick: NEGATIVE
Protein, ur: 30 mg/dL — AB
Specific Gravity, Urine: >1.03 — ABNORMAL HIGH (ref 1.005–1.030)

## 2011-11-20 LAB — URINE MICROSCOPIC-ADD ON

## 2011-11-20 NOTE — Progress Notes (Signed)
Pt states has had diarrhea and vomiting for wks. Frequently when vomits has diarrhea as well. Does not void much at all

## 2011-11-20 NOTE — Progress Notes (Signed)
Pt states, " I have been vomiting for several weeks, but it has been worse today. I've had cramping in my whole belly and it is sore too and I'm having shooting pain in my back.I vomited so much I have had blood streaks  when I vomit."

## 2011-11-21 ENCOUNTER — Encounter (HOSPITAL_COMMUNITY): Payer: Self-pay | Admitting: Family

## 2011-11-21 MED ORDER — PROMETHAZINE HCL 12.5 MG RE SUPP: 12.5000 mg | Freq: Four times a day (QID) | RECTAL | Status: DC | PRN

## 2011-11-21 MED ORDER — METRONIDAZOLE 500 MG PO TABS: 500.0000 mg | ORAL_TABLET | Freq: Two times a day (BID) | ORAL | Status: DC

## 2011-11-21 MED ORDER — ONDANSETRON HCL 4 MG/2ML IJ SOLN
4.0000 mg | Freq: Once | INTRAMUSCULAR | Status: AC
  Administered 2011-11-21: 4 mg via INTRAVENOUS
  Filled 2011-11-21: qty 2

## 2011-11-21 MED ORDER — LACTATED RINGERS IV BOLUS (SEPSIS)
1000.0000 mL | Freq: Once | INTRAVENOUS | Status: AC
  Administered 2011-11-21: 1000 mL via INTRAVENOUS

## 2011-11-21 NOTE — ED Notes (Signed)
W. Muhammed CNM in to see pt 

## 2011-11-21 NOTE — ED Provider Notes (Signed)
Attestation of Attending Supervision of Advanced Practitioner: Evaluation and management procedures were performed by the PA/NP/CNM/OB Fellow under my supervision/collaboration. Chart reviewed, and agree with management and plan.  Emmamarie Kluender, M.D. 11/21/2011 7:09 AM   

## 2011-11-21 NOTE — ED Notes (Signed)
Pt has not ginger ale yet -has been sleeping. Will some try flds to see if tolerated

## 2011-11-21 NOTE — Discharge Instructions (Signed)
Morning Sickness Morning sickness is when you feel sick to your stomach (nauseous) during pregnancy. This nauseous feeling may or may not come with throwing up (vomiting). It often occurs in the morning, but can be a problem any time of day. While morning sickness is unpleasant, it is usually harmless unless you develop severe and continual vomiting (hyperemesis gravidarum). This condition requires more intense treatment. CAUSES  The cause of morning sickness is not completely known but seems to be related to a sudden increase of two hormones:   Human chorionic gonadotropin (hCG).   Estrogen hormone.  These are elevated in the first part of the pregnancy. TREATMENT  Do not use any medicines (prescription, over-the-counter, or herbal) for morning sickness without first talking to your caregiver. Some patients are helped by the following:  Vitamin B6 (25mg every 8 hours) or vitamin B6 shots.   An antihistamine called doxylamine (10mg every 8 hours).   The herbal medication ginger.  HOME CARE INSTRUCTIONS   Taking multivitamins before getting pregnant can prevent or decrease the severity of morning sickness in most women.   Eat a piece of dry toast or unsalted crackers before getting out of bed in the morning.   Eat 5 or 6 small meals a day.   Eat dry and bland foods (rice, baked potato).   Do not drink liquids with your meals. Drink liquids between meals.   Avoid greasy, fatty, and spicy foods.   Get someone to cook for you if the smell of any food causes nausea and vomiting.   Avoid vitamin pills with iron because iron can cause nausea.   Snack on protein foods between meals if you are hungry.   Eat unsweetened gelatins for deserts.   Wear an acupressure wristband (worn for sea sickness) may be helpful.   Acupuncture may be helpful.   Do not smoke.   Get a humidifier to keep the air in your house free of odors.  SEEK MEDICAL CARE IF:   Your home remedies are not working  and you need medication.   You feel dizzy or lightheaded.   You are losing weight.   You need help with your diet.  SEEK IMMEDIATE MEDICAL CARE IF:   You have persistent and uncontrolled nausea and vomiting.   You pass out (faint).   You have a fever.  MAKE SURE YOU:   Understand these instructions.   Will watch your condition.   Will get help right away if you are not doing well or get worse.  Document Released: 11/11/2006 Document Revised: 06/02/2011 Document Reviewed: 09/08/2007 Exit[employer] Patient Information 2012 [employer]. 

## 2011-11-21 NOTE — Progress Notes (Signed)
Written and verbal d/c instructions given and understanding voiced. 

## 2011-11-21 NOTE — ED Notes (Signed)
Pt states vomited after drinking ginger ale. Does not want Zofran ODT -may cause n/v. Would like to try phenegan supp.

## 2011-11-21 NOTE — ED Notes (Signed)
Pt unable to void currently

## 2011-11-21 NOTE — ED Provider Notes (Signed)
History     Chief Complaint  Patient presents with  . Morning Sickness  . Abdominal Pain  . Back Pain   HPI  Pt states, " I have been vomiting for several weeks, but it has been worse today. I've had cramping in my whole belly and it is sore too and I'm having shooting pain in my back.I vomited so much I have had blood streaks when I vomit."   Vomited x 3 every hour; reports phenergan is not helping.          Past Medical History  Diagnosis Date  . Pregnancy induced hypertension   . Depression   . Headache   . Asthma   . Urinary tract infection   . Abnormal Pap smear   . Human papilloma virus   . Trichomonas   . Acid reflux     Past Surgical History  Procedure Date  . No past surgeries     Family History  Problem Relation Age of Onset  . Anesthesia problems Neg Hx   . Hypotension Neg Hx   . Malignant hyperthermia Neg Hx   . Pseudochol deficiency Neg Hx     History  Substance Use Topics  . Smoking status: Never Smoker   . Smokeless tobacco: Never Used  . Alcohol Use: No    Allergies: No Known Allergies  Prescriptions prior to admission  Medication Sig Dispense Refill  . albuterol (VENTOLIN HFA) 108 (90 BASE) MCG/ACT inhaler Inhale 2 puffs into the lungs every 4 (four) hours as needed for wheezing.  18 g  0  . metroNIDAZOLE (FLAGYL) 500 MG tablet Take 500 mg by mouth 3 (three) times daily.      . promethazine (PHENERGAN) 25 MG tablet Take 25 mg by mouth every 6 (six) hours as needed. For nausea        Review of Systems  Constitutional: Positive for chills and malaise/fatigue. Negative for fever.  HENT: Negative.   Respiratory: Negative for cough.   Cardiovascular: Negative.   Gastrointestinal: Positive for nausea, vomiting and abdominal pain ( with vomiting).  Genitourinary: Negative.   Musculoskeletal: Positive for myalgias.   Physical Exam   Blood pressure 123/79, pulse 98, temperature 98.3 F (36.8 C), temperature source Oral, resp. rate 20,  height 5\' 2"  (1.575 m), weight 78.075 kg (172 lb 2 oz), last menstrual period 09/06/2011, unknown if currently breastfeeding.  Physical Exam  Constitutional: She is oriented to person, place, and time. She appears well-developed and well-nourished. No distress.  HENT:  Head: Normocephalic.  Mouth/Throat: Mucous membranes are normal. Mucous membranes are not dry.  Neck: Normal range of motion. Neck supple.  Cardiovascular: Normal rate, regular rhythm and normal heart sounds.   Respiratory: Effort normal and breath sounds normal.  GI: Soft. Bowel sounds are normal. There is no tenderness.  Genitourinary: No bleeding around the vagina.  Neurological: She is alert and oriented to person, place, and time.  Skin: Skin is warm and dry.    MAU Course  Procedures  IV LR with Zofran  Assessment and Plan  Vomiting in Pregnancy  Plan: DC to home RX Phenergan suppository (declines PO meds) F/U prn  [facility] 11/21/2011, 12:21 AM

## 2011-12-03 HISTORY — PX: INDUCED ABORTION: SHX677

## 2012-01-01 ENCOUNTER — Encounter (HOSPITAL_COMMUNITY): Payer: Self-pay | Admitting: Emergency Medicine

## 2012-01-01 ENCOUNTER — Emergency Department (HOSPITAL_COMMUNITY)
Admission: EM | Admit: 2012-01-01 | Discharge: 2012-01-01 | Disposition: A | Discharge status: Home / Self Care | Payer: Self-pay | Attending: Emergency Medicine | Admitting: Emergency Medicine

## 2012-01-01 DIAGNOSIS — N760 Acute vaginitis: Secondary | ICD-10-CM | POA: Insufficient documentation

## 2012-01-01 DIAGNOSIS — R109 Unspecified abdominal pain: Secondary | ICD-10-CM | POA: Insufficient documentation

## 2012-01-01 DIAGNOSIS — N898 Other specified noninflammatory disorders of vagina: Secondary | ICD-10-CM | POA: Insufficient documentation

## 2012-01-01 DIAGNOSIS — B9689 Other specified bacterial agents as the cause of diseases classified elsewhere: Secondary | ICD-10-CM | POA: Insufficient documentation

## 2012-01-01 DIAGNOSIS — A499 Bacterial infection, unspecified: Secondary | ICD-10-CM | POA: Insufficient documentation

## 2012-01-01 LAB — URINALYSIS, ROUTINE W REFLEX MICROSCOPIC
Glucose, UA: NEGATIVE mg/dL
Leukocytes, UA: NEGATIVE
Nitrite: NEGATIVE
pH: 6 (ref 5.0–8.0)

## 2012-01-01 LAB — WET PREP, GENITAL: Trich, Wet Prep: NONE SEEN

## 2012-01-01 LAB — URINE MICROSCOPIC-ADD ON

## 2012-01-01 MED ORDER — METRONIDAZOLE 500 MG PO TABS: 500.0000 mg | ORAL_TABLET | Freq: Two times a day (BID) | ORAL | Status: AC

## 2012-01-01 NOTE — ED Notes (Signed)
Pt. Stated, I had an abortion a month ago and I'm still having pain I was suppose to follow up and I didn't go,  Also can yall per scribe to me some more Zoloft?

## 2012-01-01 NOTE — Discharge Instructions (Signed)
Bacterial Vaginosis Bacterial vaginosis (BV) is a vaginal infection where the normal balance of bacteria in the vagina is disrupted. The normal balance is then replaced by an overgrowth of certain bacteria. There are several different kinds of bacteria that can cause BV. BV is the most common vaginal infection in women of childbearing age. CAUSES   The cause of BV is not fully understood. BV develops when there is an increase or imbalance of harmful bacteria.   Some activities or behaviors can upset the normal balance of bacteria in the vagina and put women at increased risk including:   Having a new sex partner or multiple sex partners.   Douching.   Using an intrauterine device (IUD) for contraception.   It is not clear what role sexual activity plays in the development of BV. However, women that have never had sexual intercourse are rarely infected with BV.  Women do not get BV from toilet seats, bedding, swimming pools or from touching objects around them.  SYMPTOMS   Grey vaginal discharge.   A fish-like odor with discharge, especially after sexual intercourse.   Itching or burning of the vagina and vulva.   Burning or pain with urination.   Some women have no signs or symptoms at all.  DIAGNOSIS  Your caregiver must examine the vagina for signs of BV. Your caregiver will perform lab tests and look at the sample of vaginal fluid through a microscope. They will look for bacteria and abnormal cells (clue cells), a pH test higher than 4.5, and a positive amine test all associated with BV.  RISKS AND COMPLICATIONS   Pelvic inflammatory disease (PID).   Infections following gynecology surgery.   Developing HIV.   Developing herpes virus.  TREATMENT  Sometimes BV will clear up without treatment. However, all women with symptoms of BV should be treated to avoid complications, especially if gynecology surgery is planned. Female partners generally do not need to be treated. However,  BV may spread between female sex partners so treatment is helpful in preventing a recurrence of BV.   BV may be treated with antibiotics. The antibiotics come in either pill or vaginal cream forms. Either can be used with nonpregnant or pregnant women, but the recommended dosages differ. These antibiotics are not harmful to the baby.   BV can recur after treatment. If this happens, a second round of antibiotics will often be prescribed.   Treatment is important for pregnant women. If not treated, BV can cause a premature delivery, especially for a pregnant woman who had a premature birth in the past. All pregnant women who have symptoms of BV should be checked and treated.   For chronic reoccurrence of BV, treatment with a type of prescribed gel vaginally twice a week is helpful.  HOME CARE INSTRUCTIONS   Finish all medication as directed by your caregiver.   Do not have sex until treatment is completed.   Tell your sexual partner that you have a vaginal infection. They should see their caregiver and be treated if they have problems, such as a mild rash or itching.   Practice safe sex. Use condoms. Only have 1 sex partner.  PREVENTION  Basic prevention steps can help reduce the risk of upsetting the natural balance of bacteria in the vagina and developing BV:  Do not have sexual intercourse (be abstinent).   Do not douche.   Use all of the medicine prescribed for treatment of BV, even if the signs and symptoms go away.     Tell your sex partner if you have BV. That way, they can be treated, if needed, to prevent reoccurrence.  SEEK MEDICAL CARE IF:   Your symptoms are not improving after 3 days of treatment.   You have increased discharge, pain, or fever.  MAKE SURE YOU:   Understand these instructions.   Will watch your condition.   Will get help right away if you are not doing well or get worse.  FOR MORE INFORMATION  Division of STD Prevention (DSTDP), Centers for Disease  Control and Prevention: www.cdc.gov/std American Social Health Association (ASHA): www.ashastd.org  Document Released: 09/20/2005 Document Revised: 09/09/2011 Document Reviewed: 03/13/2009 Exit[employer] Patient Information 2012 [employer]. 

## 2012-01-01 NOTE — ED Provider Notes (Signed)
History     CSN: [id number]  Arrival date & time 01/01/12  1153   First MD Initiated Contact with Patient 01/01/12 1222      Chief Complaint  Patient presents with  . Abdominal Pain    (Consider location/radiation/quality/duration/timing/severity/associated sxs/prior treatment) Patient is a 21 y.o. female presenting with abdominal pain. The history is provided by the patient.  Abdominal Pain The primary symptoms of the illness include abdominal pain.   patient here with vaginal discharge and spotting. She had an abortion a month ago and has been having some dysuria since. She denies any fever, lower back pain. No medications taken prior to arrival. Nothing makes her symptoms better. Denies any abdominal pain.  Past Medical History  Diagnosis Date  . Pregnancy induced hypertension   . Depression   . Headache   . Asthma   . Urinary tract infection   . Abnormal Pap smear   . Human papilloma virus   . Trichomonas   . Acid reflux     Past Surgical History  Procedure Date  . No past surgeries   . Cosmetic surgery     Family History  Problem Relation Age of Onset  . Anesthesia problems Neg Hx   . Hypotension Neg Hx   . Malignant hyperthermia Neg Hx   . Pseudochol deficiency Neg Hx     History  Substance Use Topics  . Smoking status: Never Smoker   . Smokeless tobacco: Never Used  . Alcohol Use: No    OB History    Grav Para Term Preterm Abortions TAB SAB Ect Mult Living   3 2 1 1  0 0 0 0 0 2      Review of Systems  Gastrointestinal: Positive for abdominal pain.  All other systems reviewed and are negative.    Allergies  Review of patient's allergies indicates no known allergies.  Home Medications  No current outpatient prescriptions on file.  BP 126/70  Pulse 57  Temp(Src) 98.6 F (37 C) (Oral)  SpO2 100%  LMP 09/06/2011  Breastfeeding? Unknown  Physical Exam  Nursing note and vitals reviewed. Constitutional: She is oriented to person, place,  and time. She appears well-developed and well-nourished.  Non-toxic appearance. No distress.  HENT:  Head: Normocephalic and atraumatic.  Eyes: Conjunctivae, EOM and lids are normal. Pupils are equal, round, and reactive to light.  Neck: Normal range of motion. Neck supple. No tracheal deviation present. No mass present.  Cardiovascular: Normal rate, regular rhythm and normal heart sounds.  Exam reveals no gallop.   No murmur heard. Pulmonary/Chest: Effort normal and breath sounds normal. No stridor. No respiratory distress. She has no decreased breath sounds. She has no wheezes. She has no rhonchi. She has no rales.  Abdominal: Soft. Normal appearance and bowel sounds are normal. She exhibits no distension. There is no tenderness. There is no rebound and no CVA tenderness.  Genitourinary:       Cervical os closed. Some vaginal bleeding noted  Musculoskeletal: Normal range of motion. She exhibits no edema and no tenderness.  Neurological: She is alert and oriented to person, place, and time. She has normal strength. No cranial nerve deficit or sensory deficit. GCS eye subscore is 4. GCS verbal subscore is 5. GCS motor subscore is 6.  Skin: Skin is warm and dry. No abrasion and no rash noted.  Psychiatric: She has a normal mood and affect. Her speech is normal and behavior is normal.    ED Course  Procedures (  including critical care time)   Labs Reviewed  URINALYSIS, ROUTINE W REFLEX MICROSCOPIC  URINE CULTURE  GC/CHLAMYDIA PROBE AMP, GENITAL  WET PREP, GENITAL   No results found.   No diagnosis found.    MDM  Patient to be treated for bacterial vaginosis        Toy Baker, MD 01/01/12 1427

## 2012-01-02 LAB — URINE CULTURE
Colony Count: 25000
Culture  Setup Time: 201303301704

## 2012-01-03 LAB — GC/CHLAMYDIA PROBE AMP, GENITAL: Chlamydia, DNA Probe: NEGATIVE

## 2012-03-12 ENCOUNTER — Encounter (HOSPITAL_COMMUNITY): Payer: Self-pay | Admitting: General Practice

## 2012-03-12 ENCOUNTER — Emergency Department (HOSPITAL_COMMUNITY)
Admission: EM | Admit: 2012-03-12 | Discharge: 2012-03-12 | Disposition: A | Discharge status: Home / Self Care | Payer: Self-pay | Attending: Emergency Medicine | Admitting: Emergency Medicine

## 2012-03-12 DIAGNOSIS — J020 Streptococcal pharyngitis: Secondary | ICD-10-CM | POA: Insufficient documentation

## 2012-03-12 LAB — RAPID STREP SCREEN (MED CTR MEBANE ONLY): Streptococcus, Group A Screen (Direct): POSITIVE — AB

## 2012-03-12 MED ORDER — AMOXICILLIN 500 MG PO CAPS
1000.0000 mg | ORAL_CAPSULE | Freq: Once | ORAL | Status: AC
  Administered 2012-03-12: 1000 mg via ORAL
  Filled 2012-03-12: qty 2

## 2012-03-12 MED ORDER — ACETAMINOPHEN 325 MG PO TABS
975.0000 mg | ORAL_TABLET | Freq: Once | ORAL | Status: AC
  Administered 2012-03-12: 975 mg via ORAL
  Filled 2012-03-12: qty 3

## 2012-03-12 MED ORDER — AMOXICILLIN 500 MG PO CAPS: 1000.0000 mg | ORAL_CAPSULE | Freq: Two times a day (BID) | ORAL | Status: AC

## 2012-03-12 NOTE — ED Notes (Signed)
Pt c/o of sore throat x 2 days. No fever.

## 2012-03-12 NOTE — ED Provider Notes (Signed)
History     CSN: [id number]  Arrival date & time 03/12/12  1805   First MD Initiated Contact with Patient 03/12/12 1810      Chief Complaint  Patient presents with  . Sore Throat    (Consider location/radiation/quality/duration/timing/severity/associated sxs/prior Treatment) Patient with sore throat since last night.  No other symptoms.  Denies nasal congestion/URI or fevers. Patient is a 21 y.o. female presenting with pharyngitis. The history is provided by the patient. No language interpreter was used.  Sore Throat This is a new problem. The current episode started yesterday. The problem occurs constantly. The problem has been unchanged. Associated symptoms include a sore throat. Pertinent negatives include no congestion, coughing, fever, neck pain, rash, swollen glands or vomiting. The symptoms are aggravated by swallowing. She has tried nothing for the symptoms.    Past Medical History  Diagnosis Date  . Pregnancy induced hypertension   . Depression   . Headache   . Asthma   . Urinary tract infection   . Abnormal Pap smear   . Human papilloma virus   . Trichomonas   . Acid reflux     Past Surgical History  Procedure Date  . No past surgeries   . Cosmetic surgery     Family History  Problem Relation Age of Onset  . Anesthesia problems Neg Hx   . Hypotension Neg Hx   . Malignant hyperthermia Neg Hx   . Pseudochol deficiency Neg Hx     History  Substance Use Topics  . Smoking status: Never Smoker   . Smokeless tobacco: Never Used  . Alcohol Use: No    OB History    Grav Para Term Preterm Abortions TAB SAB Ect Mult Living   3 2 1 1  0 0 0 0 0 2      Review of Systems  Constitutional: Negative for fever.  HENT: Positive for sore throat. Negative for congestion and neck pain.   Respiratory: Negative for cough.   Gastrointestinal: Negative for vomiting.  Skin: Negative for rash.  All other systems reviewed and are negative.    Allergies  Review of  patient's allergies indicates no known allergies.  Home Medications   Current Outpatient Rx  Name Route Sig Dispense Refill  . SERTRALINE HCL 100 MG PO TABS Oral Take 100 mg by mouth daily.      BP 113/76  Pulse 87  Temp(Src) 98.2 F (36.8 C) (Oral)  Resp 16  SpO2 99%  LMP 09/06/2011  Breastfeeding? Unknown  Physical Exam  Nursing note and vitals reviewed. Constitutional: She is oriented to person, place, and time. Vital signs are normal. She appears well-developed and well-nourished. She is active and cooperative.  Non-toxic appearance. No distress.  HENT:  Head: Normocephalic and atraumatic.  Right Ear: Tympanic membrane, external ear and ear canal normal.  Left Ear: Tympanic membrane, external ear and ear canal normal.  Nose: Nose normal.  Mouth/Throat: Uvula is midline. Oropharyngeal exudate and posterior oropharyngeal erythema present.  Eyes: EOM are normal. Pupils are equal, round, and reactive to light.  Neck: Normal range of motion. Neck supple.  Cardiovascular: Normal rate, regular rhythm, normal heart sounds and intact distal pulses.   Pulmonary/Chest: Effort normal and breath sounds normal. No respiratory distress.  Abdominal: Soft. Bowel sounds are normal. She exhibits no distension and no mass. There is no tenderness.  Musculoskeletal: Normal range of motion.  Neurological: She is alert and oriented to person, place, and time. Coordination normal.  Skin: Skin is  warm and dry. No rash noted.  Psychiatric: She has a normal mood and affect. Her behavior is normal. Judgment and thought content normal.    ED Course  Procedures (including critical care time)  Labs Reviewed  RAPID STREP SCREEN - Abnormal; Notable for the following:    Streptococcus, Group A Screen (Direct) POSITIVE (*)    All other components within normal limits   No results found.   1. Strep pharyngitis       MDM  Patient with sore throat since last night, no other symptoms.  Tolerating  PO without emesis.  On exam, bilateral tonsillar erythema and exudate.  Will obtain strep and reevaluate.        Purvis Sheffield, NP 03/12/12 [phone]

## 2012-03-12 NOTE — Discharge Instructions (Signed)

## 2012-03-12 NOTE — ED Notes (Signed)
MD at bedside. 

## 2012-03-13 NOTE — ED Provider Notes (Signed)
Medical screening examination/treatment/procedure(s) were performed by non-physician practitioner and as supervising physician I was immediately available for consultation/collaboration.   Lorena Benham N Lyn Joens, MD 03/13/12 1445 

## 2012-04-11 ENCOUNTER — Encounter (HOSPITAL_COMMUNITY): Payer: Self-pay | Admitting: *Deleted

## 2012-04-11 ENCOUNTER — Inpatient Hospital Stay (HOSPITAL_COMMUNITY)
Admission: AD | Admit: 2012-04-11 | Discharge: 2012-04-11 | Disposition: A | Discharge status: Home / Self Care | Payer: Medicaid Other | Source: Ambulatory Visit | Attending: Obstetrics & Gynecology | Admitting: Obstetrics & Gynecology

## 2012-04-11 DIAGNOSIS — N949 Unspecified condition associated with female genital organs and menstrual cycle: Secondary | ICD-10-CM | POA: Insufficient documentation

## 2012-04-11 DIAGNOSIS — R1032 Left lower quadrant pain: Secondary | ICD-10-CM | POA: Insufficient documentation

## 2012-04-11 DIAGNOSIS — R109 Unspecified abdominal pain: Secondary | ICD-10-CM

## 2012-04-11 LAB — URINALYSIS, ROUTINE W REFLEX MICROSCOPIC
Glucose, UA: NEGATIVE mg/dL
Ketones, ur: NEGATIVE mg/dL
Nitrite: NEGATIVE
Specific Gravity, Urine: >1.03 — ABNORMAL HIGH (ref 1.005–1.030)
pH: 6 (ref 5.0–8.0)

## 2012-04-11 LAB — URINE MICROSCOPIC-ADD ON

## 2012-04-11 LAB — WET PREP, GENITAL: Clue Cells Wet Prep HPF POC: NONE SEEN

## 2012-04-11 MED ORDER — NAPROXEN 500 MG PO TABS: 500.0000 mg | ORAL_TABLET | Freq: Two times a day (BID) | ORAL | Status: DC

## 2012-04-11 MED ORDER — OXYCODONE-ACETAMINOPHEN 5-325 MG PO TABS: 2.0000 | ORAL_TABLET | Freq: Once | ORAL | Status: DC

## 2012-04-11 MED ORDER — OXYCODONE-ACETAMINOPHEN 5-325 MG PO TABS: 1.0000 | ORAL_TABLET | Freq: Once | ORAL | Status: DC

## 2012-04-11 NOTE — MAU Provider Note (Signed)
History     CSN: [id number]  Arrival date and time: 04/11/12 1301   First Provider Initiated Contact with Patient 04/11/12 1420      Chief Complaint  Patient presents with  . Abdominal Pain   HPI Patient is a 21 yo [id number] who presents for abdominal pain. She states this started 3-4 days ago.  It started in the left lower quadrant and has spread across her entire lower abdomen.  Now she has right sided back pain.  She describes the pain as burning/cramping in nature and states it is a lot like the pain she had with her abortion in March.  She has also noted a white vaginal discharge of 1 weeks duration.  This is associated with a fishy odor.  She states the odor has decreased since she took 3 of her mothers amoxacillin.  She also tried one vicodin for her pain with no benefit.  The patient also expresses some concern for post-partum depression following her abortion.  She has a history of depression and is treated with zoloft, though recently ran out of her prescription.  OB History    Grav Para Term Preterm Abortions TAB SAB Ect Mult Living   3 2 1 1 1 1  0 0 0 2      Past Medical History  Diagnosis Date  . Pregnancy induced hypertension   . Depression   . Headache   . Asthma   . Urinary tract infection   . Abnormal Pap smear   . Human papilloma virus   . Trichomonas   . Acid reflux     Past Surgical History  Procedure Date  . No past surgeries   . Cosmetic surgery   . Induced abortion March 2013    Family History  Problem Relation Age of Onset  . Anesthesia problems Neg Hx   . Hypotension Neg Hx   . Malignant hyperthermia Neg Hx   . Pseudochol deficiency Neg Hx   . Other Neg Hx     History  Substance Use Topics  . Smoking status: Current Everyday Smoker -- 0.1 packs/day    Last Attempt to Quit: 07/26/2010  . Smokeless tobacco: Never Used  . Alcohol Use: No    Allergies: No Known Allergies  Prescriptions prior to admission  Medication Sig Dispense Refill    . sertraline (ZOLOFT) 100 MG tablet Take 100 mg by mouth daily.        ROS negative except per HPI Physical Exam   Blood pressure 121/82, pulse 68, temperature 98.7 F (37.1 C), temperature source Oral, resp. rate 16, height 5\' 2"  (1.575 m), weight 68.765 kg (151 lb 9.6 oz), last menstrual period 04/03/2012, SpO2 100.00%, unknown if currently breastfeeding.  Physical Exam  Constitutional: She appears well-developed and well-nourished.  Cardiovascular: Normal rate, regular rhythm and normal heart sounds.   Respiratory: Effort normal and breath sounds normal.  GI: Soft. Bowel sounds are normal. She exhibits no distension. There is tenderness (over suprapubic region). There is no rebound and no guarding.  Genitourinary:       Speculum: moderate amount of white mucousy discharge visualized, appeared to be originating from the cervical os Cervical tenderness on manual exam   Psychiatric: She has a normal mood and affect. Her behavior is normal. Thought content normal.    MAU Course  Procedures  Wet prep revealed many WBCs, no clue cells, yeast, or trichomonas seen  Assessment and Plan  This is a 21 [id number] who presents with complaint of  abdominal pain and vaginal discharge. Likely of infectious origin.  Discharge home from the MAU. Given prescription for naproxen for abdominal pain. Was offered U/S to evaluate for her abdominal pain, but patient declined. GC/Chlamydia pending. Patient advised to return to MAU as needed.  Marikay Alar 04/11/2012, 2:35 PM   Patient seen and examined.  Agree with above note.  Levie Heritage, DO 04/11/2012 4:10 PM

## 2012-04-11 NOTE — MAU Note (Signed)
Patient states she has had lower abdominal pain for 4 days. Has been taking her mothers Ampicillin. Has had a green vaginal discharge with odor for about one week.

## 2012-04-12 ENCOUNTER — Encounter (HOSPITAL_COMMUNITY): Payer: Self-pay | Admitting: *Deleted

## 2012-04-12 ENCOUNTER — Inpatient Hospital Stay (HOSPITAL_COMMUNITY): Payer: Medicaid Other

## 2012-04-12 ENCOUNTER — Inpatient Hospital Stay (HOSPITAL_COMMUNITY)
Admission: AD | Admit: 2012-04-12 | Discharge: 2012-04-12 | Disposition: A | Discharge status: Home / Self Care | Payer: Medicaid Other | Source: Ambulatory Visit | Attending: Family Medicine | Admitting: Family Medicine

## 2012-04-12 DIAGNOSIS — N838 Other noninflammatory disorders of ovary, fallopian tube and broad ligament: Secondary | ICD-10-CM | POA: Insufficient documentation

## 2012-04-12 DIAGNOSIS — R109 Unspecified abdominal pain: Secondary | ICD-10-CM | POA: Insufficient documentation

## 2012-04-12 DIAGNOSIS — N730 Acute parametritis and pelvic cellulitis: Secondary | ICD-10-CM

## 2012-04-12 DIAGNOSIS — N739 Female pelvic inflammatory disease, unspecified: Secondary | ICD-10-CM | POA: Insufficient documentation

## 2012-04-12 LAB — URINALYSIS, ROUTINE W REFLEX MICROSCOPIC
Glucose, UA: NEGATIVE mg/dL
Ketones, ur: NEGATIVE mg/dL
Nitrite: NEGATIVE
Specific Gravity, Urine: 1.025 (ref 1.005–1.030)
pH: 6 (ref 5.0–8.0)

## 2012-04-12 LAB — URINE MICROSCOPIC-ADD ON

## 2012-04-12 LAB — GC/CHLAMYDIA PROBE AMP, GENITAL: Chlamydia, DNA Probe: POSITIVE — AB

## 2012-04-12 MED ORDER — KETOROLAC TROMETHAMINE 30 MG/ML IJ SOLN
30.0000 mg | Freq: Once | INTRAMUSCULAR | Status: AC
  Administered 2012-04-12: 30 mg via INTRAMUSCULAR
  Filled 2012-04-12: qty 1

## 2012-04-12 MED ORDER — AZITHROMYCIN 1 G PO PACK
1.0000 g | PACK | Freq: Once | ORAL | Status: AC
  Administered 2012-04-12: 1 g via ORAL
  Filled 2012-04-12: qty 1

## 2012-04-12 MED ORDER — NAPROXEN 500 MG PO TABS: 500.0000 mg | ORAL_TABLET | Freq: Two times a day (BID) | ORAL | Status: DC

## 2012-04-12 MED ORDER — CEFTRIAXONE SODIUM 250 MG IJ SOLR
250.0000 mg | Freq: Once | INTRAMUSCULAR | Status: AC
  Administered 2012-04-12: 250 mg via INTRAMUSCULAR
  Filled 2012-04-12: qty 250

## 2012-04-12 NOTE — MAU Provider Note (Signed)
Attestation of Attending Supervision of Advanced Practitioner (CNM/NP): Evaluation and management procedures were performed by the Advanced Practitioner under my supervision and collaboration.  I have reviewed the Advanced Practitioner's note and chart, and I agree with the management and plan.  HARRAWAY-SMITH, Jerlisa Diliberto 6:27 PM     

## 2012-04-12 NOTE — MAU Note (Signed)
Patient was seen in MAU 7-9. Needed an ultrasound but had to leave. States the pain continues. No bleeding, watery vaginal discharge.

## 2012-04-12 NOTE — MAU Provider Note (Signed)
History     CSN: [id number]  Arrival date and time: 04/12/12 1433   None     Chief Complaint  Patient presents with  . Abdominal Pain   HPI  21 yo female [id number] who presents with abdominal pain X5 days. Seen in MAU yesterday for same symptoms. Pain started on the left side as a cramping sensation and has moved to the right side and left back. The pain is constant but variable, ranging from 6/10 to 10/10. Pt denies new sexual partners, but notes pain with intercourse 3 days ago; she also notes pain with tampon insertion during last menstrual cycle (LMP 04/03/12). She reports vaginal discharge over past 5 days which started out as thick, greenish-brown with odor and is now watery and white. She endorses 3 episodes of vomiting over the past 3 days in the AM upon waking up. Denies nausea; no fever or chills, no diarrhea. Has had regular stools (normally 1-2 per day), with last stool yesterday. She endorses some dysuria but denies burning or itching sensation.   Results from yesterday's workup include negative pregnancy test, wet prep showed many WBCs (no yeast, trich, or clue cells), urinalysis did not show infection but small leukocytes. Was offered abdominal US but had to decline because she did not have childcare.   Pt reports she has had abdominal issues since abortion in March 2013. Pt mentions several times that she worries that this problem is related to her abortion.  Pertinent Gynecological History: LMP: 04/03/12 Contraception: condoms Sexually transmitted diseases: past history: trichomonas  Past Medical History  Diagnosis Date  . Pregnancy induced hypertension   . Depression   . Headache   . Asthma   . Urinary tract infection   . Abnormal Pap smear   . Human papilloma virus   . Trichomonas   . Acid reflux     Past Surgical History  Procedure Date  . No past surgeries   . Cosmetic surgery   . Induced abortion March 2013    Family History  Problem Relation Age of  Onset  . Anesthesia problems Neg Hx   . Hypotension Neg Hx   . Malignant hyperthermia Neg Hx   . Pseudochol deficiency Neg Hx   . Other Neg Hx   . Depression Mother   . Hypertension Mother   . Diabetes Mother   . Hypertension Father     History  Substance Use Topics  . Smoking status: Current Everyday Smoker -- 0.1 packs/day    Last Attempt to Quit: 07/26/2010  . Smokeless tobacco: Never Used  . Alcohol Use: No    Allergies: No Known Allergies  Prescriptions prior to admission  Medication Sig Dispense Refill  . naproxen (NAPROSYN) 500 MG tablet Take 1 tablet (500 mg total) by mouth 2 (two) times daily.  30 tablet  0  . sertraline (ZOLOFT) 100 MG tablet Take 100 mg by mouth daily.      . traMADol (ULTRAM) 50 MG tablet Take 50 mg by mouth every 6 (six) hours as needed. For pain        Review of Systems  Constitutional: Negative for fever, chills and malaise/fatigue.  Respiratory: Negative for cough and shortness of breath.   Cardiovascular: Negative for chest pain.  Gastrointestinal: Positive for vomiting and abdominal pain. Negative for nausea, diarrhea and constipation.  Genitourinary: Positive for dysuria. Negative for urgency, frequency, hematuria and flank pain.  Neurological: Negative for dizziness, weakness and headaches.   Physical Exam   Blood pressure  121/94, pulse 72, temperature 98.6 F (37 C), temperature source Oral, resp. rate 18, last menstrual period 04/03/2012, SpO2 100.00%, unknown if currently breastfeeding.  Physical Exam  Constitutional: She is oriented to person, place, and time. She appears well-developed. No distress.  HENT:  Head: Normocephalic and atraumatic.  Cardiovascular: Normal rate, regular rhythm, normal heart sounds and intact distal pulses.  Exam reveals no gallop and no friction rub.   No murmur heard. Respiratory: Effort normal and breath sounds normal. No respiratory distress. She has no wheezes. She has no rales.  GI: Soft. Bowel  sounds are normal. She exhibits no distension and no mass. There is tenderness (LLQ > suprapubic > RLQ). There is no rebound and no guarding.  Genitourinary: Cervix exhibits motion tenderness.  Musculoskeletal: She exhibits no edema.  Neurological: She is alert and oriented to person, place, and time.  Skin: Skin is warm and dry. No rash noted. No erythema.  Psychiatric: She has a normal mood and affect. Her behavior is normal. Judgment and thought content normal.       Pt mentions abortion multiple times and is very concerned that current symptoms may be a result.    MAU Course  Procedures Abdominal US Transvaginal Pelvic US  MDM  Initial differential: PID, STI, UTI, pyelo, uterine cyst, gastritis  Urinalysis    Component Value Date/Time   COLORURINE YELLOW 04/12/2012 1522   APPEARANCEUR HAZY* 04/12/2012 1522   LABSPEC 1.025 04/12/2012 1522   PHURINE 6.0 04/12/2012 1522   GLUCOSEU NEGATIVE 04/12/2012 1522   HGBUR NEGATIVE 04/12/2012 1522   BILIRUBINUR NEGATIVE 04/12/2012 1522   KETONESUR NEGATIVE 04/12/2012 1522   PROTEINUR NEGATIVE 04/12/2012 1522   UROBILINOGEN 0.2 04/12/2012 1522   NITRITE NEGATIVE 04/12/2012 1522   LEUKOCYTESUR SMALL* 04/12/2012 1522    Assessment and Plan  PID, given CMT and hx of pain with intercourse and tampon insertion.   Ceftriaxone 250 mg IM single dose now, Azithromycin 1 g single dose now. Discussed importance of having partner checked and treated as well. Also provided information for [facility] for pt to seek counseling for any emotional stress related to abortion. Phone number [phone]; all calls are confidential.  [place] results pending.  Latina Craver, PA-S 04/12/2012, 4:04 PM   Seen and examined by me also. Agree with note.  [place] normal with a 2.7cm Dominant Follicle on left.  A:   Probable PID       Left Ovulatory follicle  P:  Antibiotics given.      Will rx Naproxen      Discharge home

## 2012-04-12 NOTE — MAU Note (Signed)
Wynelle Bourgeois CNM in with patient, giving discharge instructions and prescription.

## 2012-04-12 NOTE — MAU Note (Signed)
Marie Williams CNM at bedside 

## 2012-04-12 NOTE — MAU Note (Signed)
No reactions noted to medications.

## 2012-04-13 ENCOUNTER — Encounter (HOSPITAL_COMMUNITY): Payer: Self-pay | Admitting: *Deleted

## 2012-05-08 ENCOUNTER — Emergency Department (HOSPITAL_COMMUNITY)
Admission: EM | Admit: 2012-05-08 | Discharge: 2012-05-08 | Disposition: A | Discharge status: Home / Self Care | Payer: Medicaid Other | Attending: Emergency Medicine | Admitting: Emergency Medicine

## 2012-05-08 ENCOUNTER — Encounter (HOSPITAL_COMMUNITY): Payer: Self-pay | Admitting: Emergency Medicine

## 2012-05-08 DIAGNOSIS — F329 Major depressive disorder, single episode, unspecified: Secondary | ICD-10-CM | POA: Insufficient documentation

## 2012-05-08 DIAGNOSIS — H109 Unspecified conjunctivitis: Secondary | ICD-10-CM | POA: Insufficient documentation

## 2012-05-08 DIAGNOSIS — F17200 Nicotine dependence, unspecified, uncomplicated: Secondary | ICD-10-CM | POA: Insufficient documentation

## 2012-05-08 DIAGNOSIS — Z202 Contact with and (suspected) exposure to infections with a predominantly sexual mode of transmission: Secondary | ICD-10-CM | POA: Insufficient documentation

## 2012-05-08 DIAGNOSIS — K219 Gastro-esophageal reflux disease without esophagitis: Secondary | ICD-10-CM | POA: Insufficient documentation

## 2012-05-08 DIAGNOSIS — J45909 Unspecified asthma, uncomplicated: Secondary | ICD-10-CM | POA: Insufficient documentation

## 2012-05-08 DIAGNOSIS — A64 Unspecified sexually transmitted disease: Secondary | ICD-10-CM

## 2012-05-08 DIAGNOSIS — F3289 Other specified depressive episodes: Secondary | ICD-10-CM | POA: Insufficient documentation

## 2012-05-08 LAB — URINALYSIS, ROUTINE W REFLEX MICROSCOPIC
Bilirubin Urine: NEGATIVE
Hgb urine dipstick: NEGATIVE
Ketones, ur: NEGATIVE mg/dL
Nitrite: NEGATIVE
Protein, ur: NEGATIVE mg/dL
Specific Gravity, Urine: 1.023 (ref 1.005–1.030)
Urobilinogen, UA: 0.2 mg/dL (ref 0.0–1.0)

## 2012-05-08 LAB — PREGNANCY, URINE: Preg Test, Ur: NEGATIVE

## 2012-05-08 LAB — URINE MICROSCOPIC-ADD ON

## 2012-05-08 MED ORDER — ONDANSETRON 4 MG PO TBDP
4.0000 mg | ORAL_TABLET | Freq: Once | ORAL | Status: AC
  Administered 2012-05-08: 4 mg via ORAL
  Filled 2012-05-08: qty 1

## 2012-05-08 MED ORDER — AZITHROMYCIN 250 MG PO TABS
1000.0000 mg | ORAL_TABLET | Freq: Once | ORAL | Status: AC
  Administered 2012-05-08: 1000 mg via ORAL
  Filled 2012-05-08: qty 4

## 2012-05-08 MED ORDER — POLYMYXIN B-TRIMETHOPRIM 10000-0.1 UNIT/ML-% OP SOLN: OPHTHALMIC | Status: DC

## 2012-05-08 MED ORDER — AZITHROMYCIN 1 G PO PACK: 1.0000 g | PACK | Freq: Once | ORAL | Status: DC

## 2012-05-08 MED ORDER — LIDOCAINE HCL (PF) 1 % IJ SOLN
INTRAMUSCULAR | Status: AC
  Filled 2012-05-08: qty 5

## 2012-05-08 MED ORDER — CEFTRIAXONE SODIUM 250 MG IJ SOLR
250.0000 mg | Freq: Once | INTRAMUSCULAR | Status: AC
  Administered 2012-05-08: 250 mg via INTRAMUSCULAR
  Filled 2012-05-08: qty 250

## 2012-05-08 NOTE — ED Notes (Signed)
Pt states her left eye really hurts, and she may be pregnant and may have an std

## 2012-05-08 NOTE — ED Notes (Signed)
Pt states left eye hurts, draining white drainage, she also states she has been in thinks she may have and std and she might be pregnant

## 2012-05-08 NOTE — ED Provider Notes (Signed)
History     CSN: [id number]  Arrival date & time 05/08/12  1600   First MD Initiated Contact with Patient 05/08/12 1644      Chief Complaint  Patient presents with  . Exposure to STD    (Consider location/radiation/quality/duration/timing/severity/associated sxs/prior treatment) HPI Comments: 21 y female who presents for left eye drainage and pain.  The eye pain and redness started about 4-5 days ago.  No change in vision, no pain with eye movement.     Pt also concerned she might be pregnant and she was exposed to someone with chlamydia.  No abdominal, no vaginal bleeding, no vaginal discharge.  Last lmp was about 5-6 weeks ago.  Pt with 2 days of light bleeding about 1 week ago.       Patient is a 21 y.o. female presenting with STD exposure and conjunctivitis. The history is provided by the patient. No language interpreter was used.  Exposure to STD This is a new problem. The current episode started more than 1 week ago. The problem occurs rarely. The problem has not changed since onset.Pertinent negatives include no chest pain, no abdominal pain, no headaches and no shortness of breath. Nothing aggravates the symptoms. Nothing relieves the symptoms. She has tried nothing for the symptoms. The treatment provided no relief.  Conjunctivitis  The current episode started 5 to 7 days ago. The onset was sudden. The problem occurs continuously. The problem has been unchanged. The problem is mild. Nothing relieves the symptoms. Associated symptoms include eye itching, eye discharge, eye pain and eye redness. Pertinent negatives include no fever, no decreased vision, no double vision, no photophobia, no abdominal pain, no constipation, no diarrhea, no congestion, no ear discharge, no ear pain, no headaches, no rhinorrhea, no stridor, no neck pain, no cough, no URI, no rash and no diaper rash. The eye pain is mild. There is pain in the left eye. The eye pain is not associated with movement. The  eyelid exhibits no abnormality. She has been behaving normally. She has been eating and drinking normally. Urine output has been normal. The last void occurred less than 6 hours ago. There were no sick contacts. She has received no recent medical care.    Past Medical History  Diagnosis Date  . Pregnancy induced hypertension   . Depression   . Headache   . Asthma   . Urinary tract infection   . Abnormal Pap smear   . Human papilloma virus   . Trichomonas   . Acid reflux     Past Surgical History  Procedure Date  . No past surgeries   . Induced abortion March 2013    Family History  Problem Relation Age of Onset  . Anesthesia problems Neg Hx   . Hypotension Neg Hx   . Malignant hyperthermia Neg Hx   . Pseudochol deficiency Neg Hx   . Other Neg Hx   . Depression Mother   . Hypertension Mother   . Diabetes Mother   . Hypertension Father     History  Substance Use Topics  . Smoking status: Current Everyday Smoker -- 0.5 packs/day    Last Attempt to Quit: 07/26/2010  . Smokeless tobacco: Never Used  . Alcohol Use: No    OB History    Grav Para Term Preterm Abortions TAB SAB Ect Mult Living   3 2 1 1 1 1  0 0 0 2      Review of Systems  Constitutional: Negative for fever.  HENT: Negative for ear pain, congestion, rhinorrhea, neck pain and ear discharge.   Eyes: Positive for pain, discharge, redness and itching. Negative for double vision and photophobia.  Respiratory: Negative for cough, shortness of breath and stridor.   Cardiovascular: Negative for chest pain.  Gastrointestinal: Negative for abdominal pain, diarrhea and constipation.  Skin: Negative for rash.  Neurological: Negative for headaches.  All other systems reviewed and are negative.    Allergies  Review of patient's allergies indicates no known allergies.  Home Medications   Current Outpatient Rx  Name Route Sig Dispense Refill  . SYSTANE OP Ophthalmic Apply 3 drops to eye as needed. For itchy  eyes    . SERTRALINE HCL 100 MG PO TABS Oral Take 100 mg by mouth daily.    Marland Kitchen POLYMYXIN B-TRIMETHOPRIM 10000-0.1 UNIT/ML-% OP SOLN  1 drop to left eye q 4hours while awake x 7 days 10 mL 0    BP 126/65  Pulse 66  Temp 98.5 F (36.9 C) (Oral)  Resp 15  Wt 145 lb 8 oz (65.998 kg)  SpO2 100%  LMP 04/27/2012  Breastfeeding? No  Physical Exam  Nursing note and vitals reviewed. Constitutional: She is oriented to person, place, and time. She appears well-developed and well-nourished.  HENT:  Head: Normocephalic.  Right Ear: External ear normal.  Left Ear: External ear normal.  Eyes: Conjunctivae and EOM are normal. Left eye exhibits discharge.       Left eye redness, no proptosis,   Neck: Normal range of motion. Neck supple.  Cardiovascular: Normal rate, regular rhythm and normal heart sounds.   Pulmonary/Chest: Effort normal and breath sounds normal.  Abdominal: Soft. Bowel sounds are normal.  Musculoskeletal: Normal range of motion.  Neurological: She is alert and oriented to person, place, and time.  Skin: Skin is warm and dry.    ED Course  Procedures (including critical care time)  Labs Reviewed  URINALYSIS, ROUTINE W REFLEX MICROSCOPIC - Abnormal; Notable for the following:    APPearance CLOUDY (*)     Leukocytes, UA TRACE (*)     All other components within normal limits  URINE MICROSCOPIC-ADD ON - Abnormal; Notable for the following:    Squamous Epithelial / LPF FEW (*)     Bacteria, UA MANY (*)     All other components within normal limits  PREGNANCY, URINE  GC/CHLAMYDIA PROBE AMP, URINE   No results found.   1. STI (sexually transmitted infection)   2. Conjunctivitis       MDM  21 y with left eye conjunctivitis.  Will start on polytrim.  Will also send urine pregnancy and urine gc and chlamydia.    Pt not pregnant.   will give ceftriaxone and azithromycin to treat possible sti.    Will have follow up with pcp or ob/gyn   Discussed signs that warrant  reevaluation.         Chrystine Oiler, MD 05/08/12 [phone]

## 2012-05-09 LAB — GC/CHLAMYDIA PROBE AMP, URINE
Chlamydia, Swab/Urine, PCR: POSITIVE — AB
GC Probe Amp, Urine: NEGATIVE

## 2012-05-10 NOTE — ED Notes (Addendum)
+   chlamydia Patient treated with rocephin and Zithromax. [employer] letter faxed.

## 2012-05-11 NOTE — ED Notes (Signed)
Voice mail message left for patient to return call. 

## 2012-05-11 NOTE — ED Notes (Signed)
Patient informed of positive results after id'd x 2 and informed of need to notify partner to be treated. 

## 2012-05-19 ENCOUNTER — Emergency Department (HOSPITAL_COMMUNITY)
Admission: EM | Admit: 2012-05-19 | Discharge: 2012-05-19 | Disposition: A | Discharge status: Home / Self Care | Payer: Medicaid Other | Attending: Emergency Medicine | Admitting: Emergency Medicine

## 2012-05-19 ENCOUNTER — Encounter (HOSPITAL_COMMUNITY): Payer: Self-pay | Admitting: Emergency Medicine

## 2012-05-19 DIAGNOSIS — G43909 Migraine, unspecified, not intractable, without status migrainosus: Secondary | ICD-10-CM | POA: Insufficient documentation

## 2012-05-19 NOTE — ED Notes (Signed)
Pt not in room for evaluation.

## 2012-05-19 NOTE — ED Notes (Signed)
Pt came to desk and requested something to drink and to see the doctor. Informed of delay and agreed to provide something to drink, pt understood plan of care. Patient appeared without distress.

## 2012-05-19 NOTE — ED Notes (Signed)
Pt states that she has a hx of migraines. Pt states that she took a vicodin at home for the pain but was unable to keep it down. Pt reports vomitting and diarrhea x4 today.Pt reports that she is cold one minute and hot the next. Pain 10/10.

## 2012-05-19 NOTE — ED Notes (Signed)
Pt not in room EDPA Tiffany made aware

## 2012-06-13 ENCOUNTER — Emergency Department (HOSPITAL_COMMUNITY)
Admission: EM | Admit: 2012-06-13 | Discharge: 2012-06-13 | Discharge status: Against Medical Advice | Payer: Medicaid Other | Attending: Emergency Medicine | Admitting: Emergency Medicine

## 2012-06-13 ENCOUNTER — Encounter (HOSPITAL_COMMUNITY): Payer: Self-pay

## 2012-06-13 DIAGNOSIS — Z3202 Encounter for pregnancy test, result negative: Secondary | ICD-10-CM | POA: Insufficient documentation

## 2012-06-13 DIAGNOSIS — G43909 Migraine, unspecified, not intractable, without status migrainosus: Secondary | ICD-10-CM | POA: Insufficient documentation

## 2012-06-13 LAB — POCT PREGNANCY, URINE: Preg Test, Ur: NEGATIVE

## 2012-06-13 NOTE — ED Notes (Signed)
Pt call no answer.  

## 2012-06-13 NOTE — ED Notes (Signed)
Pt sts that she has a migraine, needs to be tested for pregnancy because "she is getting fat" and she has chlamydia and needs  To be treated, sts the health dept wound not treat her but diagnosed her.

## 2012-06-30 ENCOUNTER — Inpatient Hospital Stay (HOSPITAL_COMMUNITY)
Admission: AD | Admit: 2012-06-30 | Discharge: 2012-06-30 | Disposition: A | Discharge status: Home / Self Care | Payer: Medicaid Other | Source: Ambulatory Visit | Attending: Obstetrics & Gynecology | Admitting: Obstetrics & Gynecology

## 2012-06-30 ENCOUNTER — Encounter (HOSPITAL_COMMUNITY): Payer: Self-pay | Admitting: Obstetrics and Gynecology

## 2012-06-30 DIAGNOSIS — R109 Unspecified abdominal pain: Secondary | ICD-10-CM | POA: Insufficient documentation

## 2012-06-30 DIAGNOSIS — N949 Unspecified condition associated with female genital organs and menstrual cycle: Secondary | ICD-10-CM | POA: Insufficient documentation

## 2012-06-30 DIAGNOSIS — Z2089 Contact with and (suspected) exposure to other communicable diseases: Secondary | ICD-10-CM

## 2012-06-30 DIAGNOSIS — Z202 Contact with and (suspected) exposure to infections with a predominantly sexual mode of transmission: Secondary | ICD-10-CM | POA: Insufficient documentation

## 2012-06-30 LAB — URINALYSIS, ROUTINE W REFLEX MICROSCOPIC
Glucose, UA: NEGATIVE mg/dL
Ketones, ur: NEGATIVE mg/dL
Nitrite: NEGATIVE
Protein, ur: NEGATIVE mg/dL
pH: 6.5 (ref 5.0–8.0)

## 2012-06-30 LAB — WET PREP, GENITAL: Trich, Wet Prep: NONE SEEN

## 2012-06-30 LAB — URINE MICROSCOPIC-ADD ON

## 2012-06-30 LAB — POCT PREGNANCY, URINE: Preg Test, Ur: NEGATIVE

## 2012-06-30 MED ORDER — AZITHROMYCIN 1 G PO PACK
1.0000 g | PACK | Freq: Once | ORAL | Status: AC
  Administered 2012-06-30: 1 g via ORAL
  Filled 2012-06-30: qty 1

## 2012-06-30 MED ORDER — IBUPROFEN 600 MG PO TABS: 600.0000 mg | ORAL_TABLET | Freq: Four times a day (QID) | ORAL | Status: DC | PRN

## 2012-06-30 NOTE — MAU Provider Note (Signed)
CC: Exposure to STD and Vaginal Discharge    First Provider Initiated Contact with Patient 06/30/12 1542      HPI: Kristin Powers ia a 21 y.o. [id number]  who presents with 3 day history of yellow-brown vaginal discharge and crampy lower abdominal pain. Onset was a few days after having intercourse. The patient was treated here 05/08/2012 however her partner was not treated. She has recently had sexual intercourse with 2 different partners including the Chlamydia contact. Uses condoms for contraception. A condom broke recently. She would like to get all the STD tests. Denies dysuria, hematuria, frequency or urgency of urination.  Past Medical History  Diagnosis Date  . Pregnancy induced hypertension   . Depression   . Headache   . Asthma   . Urinary tract infection   . Abnormal Pap smear   . Human papilloma virus   . Trichomonas   . Acid reflux     OB History    Grav Para Term Preterm Abortions TAB SAB Ect Mult Living   3 2 1 1 1 1  0 0 0 2     # Outc Date GA Lbr Len/2nd Wgt Sex Del Anes PTL Lv   1 PRE 7/11 [redacted]w[redacted]d  4lb(1.814kg) M SVD EPI Yes Yes   Comments: induced PIH at 36wks   2 TRM 7/12   6lb2oz(2.778kg) F SVD   Yes   Comments: induced measuring small   3 TAB            Comments: System Generated. Please review and update pregnancy details.      Past Surgical History  Procedure Date  . No past surgeries   . Induced abortion March 2013    History   Social History  . Marital Status: Single    Spouse Name: N/A    Number of Children: N/A  . Years of Education: N/A   Occupational History  . Not on file.   Social History Main Topics  . Smoking status: Former Smoker    Quit date: 07/26/2010  . Smokeless tobacco: Never Used  . Alcohol Use: No  . Drug Use: No  . Sexually Active: Yes     plans depo at 6wk check up   Other Topics Concern  . Not on file   Social History Narrative  . No narrative on file    No current facility-administered medications on file  prior to encounter.   No current outpatient prescriptions on file prior to encounter.    No Known Allergies  ROS: Pertinent items in HPI  PHYSICAL EXAM General: Well nourished, well developed female in no acute distress Cardiovascular: Normal rate Respiratory: Normal effort Abdomen: Soft, nontender Back: No CVAT Extremities: No edema Neurologic: Alert and oriented  Speculum exam: NEFG; vagina with moderate amount of mucousy grayish discharge, no blood; cervix clean Bimanual exam: cervix closed, no CMT; uterus NT and NSSP; no adnexal tenderness or masses  LAB RESULTS Results for orders placed during the hospital encounter of 06/30/12 (from the past 24 hour(s))  URINALYSIS, ROUTINE W REFLEX MICROSCOPIC     Status: Abnormal   Collection Time   06/30/12  2:40 PM      Component Value Range   Color, Urine YELLOW  YELLOW   APPearance HAZY (*) CLEAR   Specific Gravity, Urine 1.025  1.005 - 1.030   pH 6.5  5.0 - 8.0   Glucose, UA NEGATIVE  NEGATIVE mg/dL   Hgb urine dipstick NEGATIVE  NEGATIVE   Bilirubin Urine  NEGATIVE  NEGATIVE   Ketones, ur NEGATIVE  NEGATIVE mg/dL   Protein, ur NEGATIVE  NEGATIVE mg/dL   Urobilinogen, UA 0.2  0.0 - 1.0 mg/dL   Nitrite NEGATIVE  NEGATIVE   Leukocytes, UA TRACE (*) NEGATIVE  URINE MICROSCOPIC-ADD ON     Status: Abnormal   Collection Time   06/30/12  2:40 PM      Component Value Range   Squamous Epithelial / LPF MANY (*) RARE   WBC, UA 0-2  <3 WBC/hpf   Bacteria, UA RARE  RARE   Urine-Other MUCOUS PRESENT    POCT PREGNANCY, URINE     Status: Normal   Collection Time   06/30/12  2:55 PM      Component Value Range   Preg Test, Ur NEGATIVE  NEGATIVE  WET PREP, GENITAL     Status: Abnormal   Collection Time   06/30/12  3:42 PM      Component Value Range   Yeast Wet Prep HPF POC NONE SEEN  NONE SEEN   Trich, Wet Prep NONE SEEN  NONE SEEN   Clue Cells Wet Prep HPF POC FEW (*) NONE SEEN   WBC, Wet Prep HPF POC MANY (*) NONE SEEN     IMAGING No results found.  MAU COURSE: treated for Chlamydia contact with Zithromax 1 Gm po  ASSESSMENT [id number]  1. Chlamydia contact, untreated     PLAN DIscharge home. See AVS for patient education   Medication List     As of 06/30/2012  7:04 PM    TAKE these medications         ibuprofen 600 MG tablet   Commonly known as: ADVIL,MOTRIN   Take 1 tablet (600 mg total) by mouth every 6 (six) hours as needed for pain.      ibuprofen 200 MG tablet   Commonly known as: ADVIL,MOTRIN   Take 200 mg by mouth every 6 (six) hours as needed. pain       F/U at [facility] for [facility] and completion of all STD testing   Danae Orleans, CNM 06/30/2012 3:48 PM

## 2012-06-30 NOTE — MAU Note (Signed)
Pt presents to MAU with chief complaint of STD exposure. Pt has had 2 different partners recently and wants to get tested for everything.

## 2012-07-04 LAB — GC/CHLAMYDIA PROBE AMP, GENITAL
Chlamydia, DNA Probe: POSITIVE — AB
GC Probe Amp, Genital: NEGATIVE

## 2012-09-04 ENCOUNTER — Encounter (HOSPITAL_COMMUNITY): Payer: Self-pay | Admitting: *Deleted

## 2012-09-04 ENCOUNTER — Emergency Department (HOSPITAL_COMMUNITY)
Admission: EM | Admit: 2012-09-04 | Discharge: 2012-09-04 | Disposition: A | Discharge status: Home / Self Care | Payer: Medicaid Other | Attending: Emergency Medicine | Admitting: Emergency Medicine

## 2012-09-04 ENCOUNTER — Emergency Department (HOSPITAL_COMMUNITY): Payer: Medicaid Other

## 2012-09-04 DIAGNOSIS — R0789 Other chest pain: Secondary | ICD-10-CM

## 2012-09-04 DIAGNOSIS — F3289 Other specified depressive episodes: Secondary | ICD-10-CM | POA: Insufficient documentation

## 2012-09-04 DIAGNOSIS — Y939 Activity, unspecified: Secondary | ICD-10-CM | POA: Insufficient documentation

## 2012-09-04 DIAGNOSIS — F329 Major depressive disorder, single episode, unspecified: Secondary | ICD-10-CM | POA: Insufficient documentation

## 2012-09-04 DIAGNOSIS — S298XXA Other specified injuries of thorax, initial encounter: Secondary | ICD-10-CM | POA: Insufficient documentation

## 2012-09-04 DIAGNOSIS — Z87891 Personal history of nicotine dependence: Secondary | ICD-10-CM | POA: Insufficient documentation

## 2012-09-04 DIAGNOSIS — Z87440 Personal history of urinary (tract) infections: Secondary | ICD-10-CM | POA: Insufficient documentation

## 2012-09-04 DIAGNOSIS — W19XXXA Unspecified fall, initial encounter: Secondary | ICD-10-CM

## 2012-09-04 DIAGNOSIS — Y929 Unspecified place or not applicable: Secondary | ICD-10-CM | POA: Insufficient documentation

## 2012-09-04 DIAGNOSIS — Z8709 Personal history of other diseases of the respiratory system: Secondary | ICD-10-CM

## 2012-09-04 DIAGNOSIS — J45909 Unspecified asthma, uncomplicated: Secondary | ICD-10-CM | POA: Insufficient documentation

## 2012-09-04 DIAGNOSIS — G43909 Migraine, unspecified, not intractable, without status migrainosus: Secondary | ICD-10-CM | POA: Insufficient documentation

## 2012-09-04 DIAGNOSIS — Z8669 Personal history of other diseases of the nervous system and sense organs: Secondary | ICD-10-CM

## 2012-09-04 DIAGNOSIS — M25519 Pain in unspecified shoulder: Secondary | ICD-10-CM

## 2012-09-04 DIAGNOSIS — W1789XA Other fall from one level to another, initial encounter: Secondary | ICD-10-CM | POA: Insufficient documentation

## 2012-09-04 DIAGNOSIS — Z8719 Personal history of other diseases of the digestive system: Secondary | ICD-10-CM | POA: Insufficient documentation

## 2012-09-04 MED ORDER — AEROCHAMBER PLUS W/MASK MISC: 1.0000 | Freq: Once | Status: DC

## 2012-09-04 MED ORDER — ALBUTEROL SULFATE HFA 108 (90 BASE) MCG/ACT IN AERS
2.0000 | INHALATION_SPRAY | RESPIRATORY_TRACT | Status: DC
  Administered 2012-09-04: 2 via RESPIRATORY_TRACT
  Filled 2012-09-04: qty 6.7

## 2012-09-04 MED ORDER — CYCLOBENZAPRINE HCL 10 MG PO TABS: 10.0000 mg | ORAL_TABLET | Freq: Two times a day (BID) | ORAL | Status: DC | PRN

## 2012-09-04 MED ORDER — HYDROCODONE-ACETAMINOPHEN 5-325 MG PO TABS
2.0000 | ORAL_TABLET | Freq: Once | ORAL | Status: AC
  Administered 2012-09-04: 2 via ORAL
  Filled 2012-09-04: qty 2

## 2012-09-04 MED ORDER — DIPHENHYDRAMINE HCL 25 MG PO CAPS: 25.0000 mg | ORAL_CAPSULE | Freq: Four times a day (QID) | ORAL | Status: DC | PRN

## 2012-09-04 MED ORDER — IBUPROFEN 400 MG PO TABS
800.0000 mg | ORAL_TABLET | Freq: Once | ORAL | Status: AC
  Administered 2012-09-04: 800 mg via ORAL
  Filled 2012-09-04: qty 2

## 2012-09-04 MED ORDER — PROCHLORPERAZINE MALEATE 10 MG PO TABS: 10.0000 mg | ORAL_TABLET | Freq: Two times a day (BID) | ORAL | Status: DC | PRN

## 2012-09-04 MED ORDER — IBUPROFEN 600 MG PO TABS: 600.0000 mg | ORAL_TABLET | Freq: Four times a day (QID) | ORAL | Status: DC | PRN

## 2012-09-04 NOTE — ED Notes (Signed)
Pt in s/p fall two days ago, hit left chest/rib pain and has been c/o pain since that time, pt woke up today and was unable to tolerate pain anymore, pain is increased with breathing

## 2012-09-04 NOTE — ED Provider Notes (Addendum)
History   This chart was scribed for Kristin Skene, MD by Gerlean Ren, ED Scribe. This patient was seen in room TR05C/TR05C and the patient's care was started at 4:40 PM    CSN: [id number]  Arrival date & time 09/04/12  1620   First MD Initiated Contact with Patient 09/04/12 1637      Chief Complaint  Patient presents with  . Fall     The history is provided by the patient. No language interpreter was used.   Kristin Powers is a 21 y.o. female who presents to the Emergency Department complaining of constant, gradually worsening, moderate pain in left lateral ribs underneath breast with sudden onset after falling once 4 days ago and once more 2 days ago, neither time with head trauma or LOC, that is worsened by deep breaths and does not radiate. Pt states pain was unbearable this morning. Pt has taken mother's Tramadol for pain with mild improvements. Pt states she frequently takes ibuprofen for chronic migraines with associated nausea and photophobia but that she no longer has ibuprofen to take. Pt also reports a mild phlegm-producing cough with h/o asthma but does not have an inhaler or any medication for asthma. Pt denies abdominal pain, nausea, emesis, myalgias, and joint pain. Pt is a current everyday smoker but denies alcohol use.   Past Medical History  Diagnosis Date  . Pregnancy induced hypertension   . Depression   . Headache   . Asthma   . Urinary tract infection   . Abnormal Pap smear   . Human papilloma virus   . Trichomonas   . Acid reflux     Past Surgical History  Procedure Date  . No past surgeries   . Induced abortion March 2013    Family History  Problem Relation Age of Onset  . Anesthesia problems Neg Hx   . Hypotension Neg Hx   . Malignant hyperthermia Neg Hx   . Pseudochol deficiency Neg Hx   . Other Neg Hx   . Depression Mother   . Hypertension Mother   . Diabetes Mother   . Hypertension Father     History  Substance Use Topics  . Smoking  status: Former Smoker    Quit date: 07/26/2010  . Smokeless tobacco: Never Used  . Alcohol Use: No    OB History    Grav Para Term Preterm Abortions TAB SAB Ect Mult Living   3 2 1 1 1 1  0 0 0 2      Review of Systems At least 10pt or greater review of systems completed and are negative except where specified in the HPI.  Allergies  Review of patient's allergies indicates no known allergies.  Home Medications  No current outpatient prescriptions on file.  BP 119/59  Pulse 73  Temp 98.5 F (36.9 C) (Oral)  Resp 20  SpO2 99%  Breastfeeding? No  Physical Exam  Nursing notes reviewed.  Electronic medical record reviewed. VITAL SIGNS:   Filed Vitals:   09/04/12 1627  BP: 119/59  Pulse: 73  Temp: 98.5 F (36.9 C)  TempSrc: Oral  Resp: 20  SpO2: 99%   CONSTITUTIONAL: Awake, oriented, appears non-toxic HENT: Atraumatic, normocephalic, oral mucosa pink and moist, airway patent. Nares patent without drainage. External ears normal. EYES: Conjunctiva clear, EOMI, PERRLA NECK: Trachea midline, non-tender, supple CARDIOVASCULAR: Normal heart rate, Normal rhythm, No murmurs, rubs, gallops PULMONARY/CHEST: Clear to auscultation, no rhonchi, wheezes, or rales. Symmetrical breath sounds. Mild tenderness to palpation underneath  the left breast, and lateral to the left scapula. ABDOMINAL: Non-distended, soft, non-tender - no rebound or guarding.  BS normal. NEUROLOGIC: Non-focal, moving all four extremities, no gross sensory or motor deficits. EXTREMITIES: No clubbing, cyanosis, or edema SKIN: Warm, Dry, No erythema, No rash  ED Course  Procedures (including critical care time) DIAGNOSTIC STUDIES: Oxygen Saturation is 99% on room air, normal by my interpretation.    COORDINATION OF CARE: 4:48 PM- Patient informed of clinical course, understands medical decision-making process, and agrees with plan.  Ordered PO ibuprofen and chest XR.  Labs Reviewed - No data to display No  results found.   1. Fall   2. Chest wall pain   3. Shoulder pain   4. H/O migraine   5. History of asthma       MDM  KOLLETTE ROLF is a 21 y.o. female who has taken 2 falls, one 2 days ago and another 2 days before that presenting with left-sided rib pain. We'll check a chest x-ray. Patient is mildly tender to palpation, she has clear breath sounds. I doubt rib fracture or pneumothorax. Patient would like the pain to be completely gone-I discussed realistic pain management goals with the patient. Patient has history of migraine headaches as well, will prescribe Compazine for home use.  Chest x-ray is unremarkable - no fractures, no pneumothorax. Patient discharged home in good condition.  I explained the diagnosis and have given explicit precautions to return to the ER including worsening pain shortness of breath, chest pain or any other new or worsening symptoms. The patient understands and accepts the medical plan as it's been dictated and I have answered their questions. Discharge instructions concerning home care and prescriptions have been given.  The patient is STABLE and is discharged to home in good condition.     I personally performed the services described in this documentation, which was scribed in my presence. The recorded information has been reviewed and is accurate. Kristin Powers, M.D.           Kristin Skene, MD 09/07/12 1610  Kristin Skene, MD 09/07/12 [id number]

## 2012-10-15 ENCOUNTER — Inpatient Hospital Stay (HOSPITAL_COMMUNITY)
Admission: AD | Admit: 2012-10-15 | Discharge: 2012-10-15 | Disposition: A | Discharge status: Hospice | Payer: Medicaid Other | Source: Ambulatory Visit | Attending: Obstetrics & Gynecology | Admitting: Obstetrics & Gynecology

## 2012-10-15 ENCOUNTER — Encounter (HOSPITAL_COMMUNITY): Payer: Self-pay | Admitting: *Deleted

## 2012-10-15 DIAGNOSIS — G43109 Migraine with aura, not intractable, without status migrainosus: Secondary | ICD-10-CM

## 2012-10-15 LAB — URINALYSIS, ROUTINE W REFLEX MICROSCOPIC
Bilirubin Urine: NEGATIVE
Glucose, UA: NEGATIVE mg/dL
Hgb urine dipstick: NEGATIVE
Ketones, ur: NEGATIVE mg/dL
Leukocytes, UA: NEGATIVE
pH: 6 (ref 5.0–8.0)

## 2012-10-15 LAB — WET PREP, GENITAL: Yeast Wet Prep HPF POC: NONE SEEN

## 2012-10-15 MED ORDER — HYDROCODONE-ACETAMINOPHEN 5-325 MG PO TABS
2.0000 | ORAL_TABLET | Freq: Once | ORAL | Status: AC
  Administered 2012-10-15: 2 via ORAL
  Filled 2012-10-15: qty 2

## 2012-10-15 MED ORDER — HYDROCODONE-ACETAMINOPHEN 10-325 MG PO TABS: 2.0000 | ORAL_TABLET | Freq: Once | ORAL | Status: DC

## 2012-10-15 MED ORDER — HYDROCODONE-ACETAMINOPHEN 5-500 MG PO TABS: 1.0000 | ORAL_TABLET | Freq: Four times a day (QID) | ORAL | Status: DC | PRN

## 2012-10-15 NOTE — MAU Provider Note (Signed)
CC: Possible Pregnancy and Headache    First Provider Initiated Contact with Patient 10/15/12 1959      HPI Kristin Powers is a 22 y.o. [id number]  who presents with onset yesterday of constant bandlike frontal headache, associated with photophobia and nausea. It was unresponsive to ibuprofen 400 mg 1 hr PTA. H/O migraines followed by Dr. Bascom Levels and the only effective tx has been Vicodin. Having constant suprapubic pain and occasional dysuria and frequency. Sore throat yesterday when swallowing, now throat just feels "funny." Occasional dry cough.  Amenorrheic since on Depo, but last Depo was August and she had positive HPT today.  Plans to see PMD for headaches and go to HD for contraception.  Past Medical History  Diagnosis Date  . Pregnancy induced hypertension   . Depression   . Headache   . Asthma   . Urinary tract infection   . Abnormal Pap smear   . Human papilloma virus   . Trichomonas   . Acid reflux     OB History    Grav Para Term Preterm Abortions TAB SAB Ect Mult Living   3 2 1 1 1 1  0 0 0 2     # Outc Date GA Lbr Len/2nd Wgt Sex Del Anes PTL Lv   1 PRE 7/11 [redacted]w[redacted]d  4lb(1.814kg) M SVD EPI Yes Yes   Comments: induced PIH at 36wks   2 TRM 7/12   6lb2oz(2.778kg) F SVD   Yes   Comments: induced measuring small   3 TAB            Comments: System Generated. Please review and update pregnancy details.      Past Surgical History  Procedure Date  . No past surgeries   . Induced abortion March 2013    History   Social History  . Marital Status: Single    Spouse Name: N/A    Number of Children: N/A  . Years of Education: N/A   Occupational History  . Not on file.   Social History Main Topics  . Smoking status: Current Some Day Smoker -- 0.5 packs/day for 2 years    Types: Cigarettes  . Smokeless tobacco: Never Used  . Alcohol Use: No  . Drug Use: Yes    Special: Hydrocodone     Comment: vicodin for headaches. took last Dec 2013  . Sexually Active: Yes      Birth Control/ Protection: None     Comment: plans depo at 6wk check up   Other Topics Concern  . Not on file   Social History Narrative  . No narrative on file    No current facility-administered medications on file prior to encounter.   Current Outpatient Prescriptions on File Prior to Encounter  Medication Sig Dispense Refill  . cyclobenzaprine (FLEXERIL) 10 MG tablet Take 1 tablet (10 mg total) by mouth 2 (two) times daily as needed for muscle spasms (Take at night - do not take before driving or operating machinery).  10 tablet  0  . diphenhydrAMINE (BENADRYL) 25 mg capsule Take 1 capsule (25 mg total) by mouth every 6 (six) hours as needed (take with compazine for migraine headache).  30 capsule  0  . ibuprofen (ADVIL,MOTRIN) 600 MG tablet Take 1 tablet (600 mg total) by mouth every 6 (six) hours as needed for pain.  30 tablet  0  . prochlorperazine (COMPAZINE) 10 MG tablet Take 1 tablet (10 mg total) by mouth 2 (two) times daily as needed. For  migraine, take with 25 mg Benadryl  15 tablet  0    No Known Allergies  ROS Pertinent items in HPI  PHYSICAL EXAM Filed Vitals:   10/15/12 1951  BP: 125/72  Pulse: 66  Temp:   Resp: 16   General: Well nourished, well developed female in apparent discomfort, shielding eyes from light HEENT: normocephalic, mucus membranes moist, oropharynx nl. No neck lymphadenopathy Cardiovascular: Normal rate Respiratory: Normal effort Abdomen: Soft, minimal suprapubic TTP Back: No CVAT Extremities: No edema Neurologic: Alert and oriented Speculum exam: NEFG; vagina with physiologic discharge, no blood; cervix clean Bimanual exam: cervix closed, no CMT; uterus NSSP; no adnexal masses but mild-mod left adnexal tenderness  LAB RESULTS Results for orders placed during the hospital encounter of 10/15/12 (from the past 24 hour(s))  URINALYSIS, ROUTINE W REFLEX MICROSCOPIC     Status: Abnormal   Collection Time   10/15/12  7:28 PM       Component Value Range   Color, Urine YELLOW  YELLOW   APPearance CLEAR  CLEAR   Specific Gravity, Urine >1.030 (*) 1.005 - 1.030   pH 6.0  5.0 - 8.0   Glucose, UA NEGATIVE  NEGATIVE mg/dL   Hgb urine dipstick NEGATIVE  NEGATIVE   Bilirubin Urine NEGATIVE  NEGATIVE   Ketones, ur NEGATIVE  NEGATIVE mg/dL   Protein, ur NEGATIVE  NEGATIVE mg/dL   Urobilinogen, UA 0.2  0.0 - 1.0 mg/dL   Nitrite NEGATIVE  NEGATIVE   Leukocytes, UA NEGATIVE  NEGATIVE  POCT PREGNANCY, URINE     Status: Normal   Collection Time   10/15/12  7:38 PM      Component Value Range   Preg Test, Ur NEGATIVE  NEGATIVE    IMAGING No results found.  MAU COURSE Vicodin ii po given > H/A resolved  ASSESSMENT  1. Migraine headache with aura     PLAN Discharge home. See AVS for patient education. Discussed maximum daily doses of acetaminophen and Vicodin for breakthrough pain. Follow-up Information    Schedule an appointment as soon as possible for a visit with [facility] [place].   Contact information:   [address] [place] [address] [phone]          Medication List     As of 10/16/2012  4:56 PM    TAKE these medications         cyclobenzaprine 10 MG tablet   Commonly known as: FLEXERIL   Take 1 tablet (10 mg total) by mouth 2 (two) times daily as needed for muscle spasms (Take at night - do not take before driving or operating machinery).      diphenhydrAMINE 25 mg capsule   Commonly known as: BENADRYL   Take 1 capsule (25 mg total) by mouth every 6 (six) hours as needed (take with compazine for migraine headache).      HYDROcodone-acetaminophen 5-500 MG per tablet   Commonly known as: VICODIN   Take 1 tablet by mouth every 6 (six) hours as needed for pain.      ibuprofen 600 MG tablet   Commonly known as: ADVIL,MOTRIN   Take 1 tablet (600 mg total) by mouth every 6 (six) hours as needed for pain.      prochlorperazine 10 MG tablet   Commonly known as: COMPAZINE   Take 1  tablet (10 mg total) by mouth 2 (two) times daily as needed. For migraine, take with 25 mg Benadryl  Danae Orleans, CNM 10/15/2012 8:01 PM

## 2012-10-15 NOTE — MAU Note (Signed)
Pt reports "i have having a bad migraine. i also have abd pain" reports abd pain x 3 hours, reports positive preg test one week ago. LMP unknown, last Depo was in August. Nausea.

## 2012-10-15 NOTE — MAU Note (Signed)
intermittant lower left sided abd pain since yesterday. Had 5 bouts of vomiting yesterday once today. No diarrhea.  She says her headache is her worst complaint.  She took 400mg  ibuprophen @ 1830 without any relief.

## 2012-10-16 LAB — GC/CHLAMYDIA PROBE AMP
CT Probe RNA: NEGATIVE
GC Probe RNA: NEGATIVE

## 2012-11-19 ENCOUNTER — Emergency Department (HOSPITAL_COMMUNITY)
Admission: EM | Admit: 2012-11-19 | Discharge: 2012-11-19 | Disposition: A | Discharge status: Home / Self Care | Payer: Medicaid Other | Attending: Emergency Medicine | Admitting: Emergency Medicine

## 2012-11-19 ENCOUNTER — Encounter (HOSPITAL_COMMUNITY): Payer: Self-pay | Admitting: Nurse Practitioner

## 2012-11-19 DIAGNOSIS — F17200 Nicotine dependence, unspecified, uncomplicated: Secondary | ICD-10-CM | POA: Insufficient documentation

## 2012-11-19 DIAGNOSIS — Z8669 Personal history of other diseases of the nervous system and sense organs: Secondary | ICD-10-CM | POA: Insufficient documentation

## 2012-11-19 DIAGNOSIS — Z87440 Personal history of urinary (tract) infections: Secondary | ICD-10-CM | POA: Insufficient documentation

## 2012-11-19 DIAGNOSIS — J45909 Unspecified asthma, uncomplicated: Secondary | ICD-10-CM | POA: Insufficient documentation

## 2012-11-19 DIAGNOSIS — Z8659 Personal history of other mental and behavioral disorders: Secondary | ICD-10-CM | POA: Insufficient documentation

## 2012-11-19 DIAGNOSIS — Z8619 Personal history of other infectious and parasitic diseases: Secondary | ICD-10-CM | POA: Insufficient documentation

## 2012-11-19 DIAGNOSIS — H1130 Conjunctival hemorrhage, unspecified eye: Secondary | ICD-10-CM | POA: Insufficient documentation

## 2012-11-19 DIAGNOSIS — Z8719 Personal history of other diseases of the digestive system: Secondary | ICD-10-CM | POA: Insufficient documentation

## 2012-11-19 NOTE — ED Provider Notes (Signed)
History    This chart was scribed for non-physician practitioner working with Dione Booze, MD by Frederik Pear, ED Scribe. This patient was seen in room TR04C/TR04C and the patient's care was started at 1523.   CSN: [id number]  Arrival date & time 11/19/12  1513   First MD Initiated Contact with Patient 11/19/12 1523      Chief Complaint  Patient presents with  . Eye Problem    (Consider location/radiation/quality/duration/timing/severity/associated sxs/prior treatment) The history is provided by the patient. No language interpreter was used.    Kristin Powers is a 22 y.o. female who presents to the Emergency Department complaining of sudden onset, constant, non-radiating right eye redness that is aggravated by opening and closing her eye and improved by nothing and began yesterday morning after yelling. She denies any cough, emesis, photophobia, or visual disturbances.   Past Medical History  Diagnosis Date  . Pregnancy induced hypertension   . Depression   . Headache   . Asthma   . Urinary tract infection   . Abnormal Pap smear   . Human papilloma virus   . Trichomonas   . Acid reflux     Past Surgical History  Procedure Laterality Date  . No past surgeries    . Induced abortion  March 2013    Family History  Problem Relation Age of Onset  . Anesthesia problems Neg Hx   . Hypotension Neg Hx   . Malignant hyperthermia Neg Hx   . Pseudochol deficiency Neg Hx   . Other Neg Hx   . Depression Mother   . Hypertension Mother   . Diabetes Mother   . Hypertension Father     History  Substance Use Topics  . Smoking status: Current Some Day Smoker -- 0.50 packs/day for 2 years    Types: Cigarettes  . Smokeless tobacco: Never Used  . Alcohol Use: No    OB History   Grav Para Term Preterm Abortions TAB SAB Ect Mult Living   3 2 1 1 1 1  0 0 0 2      Review of Systems  Eyes: Positive for redness. Negative for photophobia and visual disturbance.  Respiratory:  Negative for cough.   Gastrointestinal: Negative for nausea and vomiting.  Neurological: Negative for headaches.    Allergies  Review of patient's allergies indicates no known allergies.  Home Medications  No current outpatient prescriptions on file.  BP 119/79  Pulse 83  Temp(Src) 98.7 F (37.1 C) (Oral)  Resp 15  SpO2 96%  Physical Exam  Nursing note and vitals reviewed. Constitutional: She appears well-developed and well-nourished. No distress.  HENT:  Head: Normocephalic and atraumatic.  Eyes: EOM are normal. Pupils are equal, round, and reactive to light. Right conjunctiva has a hemorrhage.  There is a right eye subconjunctival  hemorrhage from the 12 o'clock to the 7 o'clock area of the right eye.  Neck: Normal range of motion. Neck supple. No tracheal deviation present.  Cardiovascular: Normal rate.   Pulmonary/Chest: Effort normal. No respiratory distress.  Abdominal: Soft. She exhibits no distension.  Musculoskeletal: Normal range of motion. She exhibits no edema.  Neurological: She is alert.  Skin: Skin is warm and dry.  Psychiatric: She has a normal mood and affect. Her behavior is normal.    ED Course  Procedures (including critical care time)  DIAGNOSTIC STUDIES: Oxygen Saturation is 96% on room air, adequate by my interpretation.    COORDINATION OF CARE:  16:15- Discussed planned course  of treatment with the patient, including using Visine if the irritation persists, who is agreeable at this time.   Labs Reviewed - No data to display No results found.   1. Subconjunctival hemorrhage    Vital signs reviewed and are as follows: Filed Vitals:   11/19/12 1519  BP: 119/79  Pulse: 83  Temp: 98.7 F (37.1 C)  Resp: 15     MDM  No foreign bodies, ulcerations, or abrasions suspected as patient does not have pain. Patient with obvious subconjunctival hemorrhage. No surrounding erythema, swelling, vision changes/loss suspicious for orbital or  periorbital cellulitis. No signs of iritis. No symptoms of retinal detachment. No ophthalmologic emergency suspected. Outpatient referral given in case of no improvement.    I personally performed the services described in this documentation, which was scribed in my presence. The recorded information has been reviewed and is accurate.        Renne Crigler, [place] 11/19/12 1704

## 2012-11-19 NOTE — ED Provider Notes (Signed)
Medical screening examination/treatment/procedure(s) were performed by non-physician practitioner and as supervising physician I was immediately available for consultation/collaboration.   Caraline Deutschman, MD 11/19/12 2331 

## 2012-11-19 NOTE — ED Notes (Signed)
Pt reports redness to R eye since yesterday am. Denies any injuries. Denies any pain.

## 2012-11-19 NOTE — ED Notes (Signed)
Sclera red on the rt outer portion since yesterday.  She has been wearing a contact lens in that eye only because she lost the other one

## 2013-03-01 ENCOUNTER — Emergency Department (HOSPITAL_COMMUNITY)
Admission: EM | Admit: 2013-03-01 | Discharge: 2013-03-02 | Disposition: A | Discharge status: Home / Self Care | Payer: Medicaid Other | Attending: Emergency Medicine | Admitting: Emergency Medicine

## 2013-03-01 ENCOUNTER — Encounter (HOSPITAL_COMMUNITY): Payer: Self-pay | Admitting: *Deleted

## 2013-03-01 DIAGNOSIS — H53149 Visual discomfort, unspecified: Secondary | ICD-10-CM | POA: Insufficient documentation

## 2013-03-01 DIAGNOSIS — H538 Other visual disturbances: Secondary | ICD-10-CM | POA: Insufficient documentation

## 2013-03-01 DIAGNOSIS — H2000 Unspecified acute and subacute iridocyclitis: Secondary | ICD-10-CM

## 2013-03-01 DIAGNOSIS — Z8659 Personal history of other mental and behavioral disorders: Secondary | ICD-10-CM | POA: Insufficient documentation

## 2013-03-01 DIAGNOSIS — R51 Headache: Secondary | ICD-10-CM | POA: Insufficient documentation

## 2013-03-01 DIAGNOSIS — H5789 Other specified disorders of eye and adnexa: Secondary | ICD-10-CM | POA: Insufficient documentation

## 2013-03-01 DIAGNOSIS — Z87440 Personal history of urinary (tract) infections: Secondary | ICD-10-CM | POA: Insufficient documentation

## 2013-03-01 DIAGNOSIS — F17200 Nicotine dependence, unspecified, uncomplicated: Secondary | ICD-10-CM | POA: Insufficient documentation

## 2013-03-01 DIAGNOSIS — Z8619 Personal history of other infectious and parasitic diseases: Secondary | ICD-10-CM | POA: Insufficient documentation

## 2013-03-01 DIAGNOSIS — Z79899 Other long term (current) drug therapy: Secondary | ICD-10-CM | POA: Insufficient documentation

## 2013-03-01 DIAGNOSIS — J45909 Unspecified asthma, uncomplicated: Secondary | ICD-10-CM | POA: Insufficient documentation

## 2013-03-01 DIAGNOSIS — Z8719 Personal history of other diseases of the digestive system: Secondary | ICD-10-CM | POA: Insufficient documentation

## 2013-03-01 DIAGNOSIS — Z8669 Personal history of other diseases of the nervous system and sense organs: Secondary | ICD-10-CM | POA: Insufficient documentation

## 2013-03-01 MED ORDER — HYDROCODONE-ACETAMINOPHEN 5-325 MG PO TABS
2.0000 | ORAL_TABLET | Freq: Once | ORAL | Status: AC
  Administered 2013-03-01: 2 via ORAL
  Filled 2013-03-01: qty 2

## 2013-03-01 MED ORDER — TETRACAINE HCL 0.5 % OP SOLN
2.0000 [drp] | Freq: Once | OPHTHALMIC | Status: AC
  Administered 2013-03-01: 2 [drp] via OPHTHALMIC
  Filled 2013-03-01: qty 2

## 2013-03-01 MED ORDER — FLUORESCEIN SODIUM 1 MG OP STRP
1.0000 | ORAL_STRIP | Freq: Once | OPHTHALMIC | Status: AC
  Administered 2013-03-02: 1 via OPHTHALMIC
  Filled 2013-03-01: qty 1

## 2013-03-01 NOTE — ED Provider Notes (Signed)
History     CSN: [id number]  Arrival date & time 03/01/13  2054   First MD Initiated Contact with Patient 03/01/13 2304      Chief Complaint  Patient presents with  . Eye Injury    (Consider location/radiation/quality/duration/timing/severity/associated sxs/prior treatment) HPI Pt is a 22yo female presenting today with right eye pain that started this morning after waking up with contacts on.  Pt states she normally sleeps with contacts in but this is the first time she has experienced this type of pain.  Pt states pain is worse with light.  Pain has increased over the course of the day.  Eye has been tearing and red causing headache.  Denies trauma to eye.  No fever, cough, sore throat, nasal congestion, or ear pain.  Has not had anything for pain.    Past Medical History  Diagnosis Date  . Pregnancy induced hypertension   . Depression   . Headache(784.0)   . Asthma   . Urinary tract infection   . Abnormal Pap smear   . Human papilloma virus   . Trichomonas   . Acid reflux     Past Surgical History  Procedure Laterality Date  . No past surgeries    . Induced abortion  March 2013    Family History  Problem Relation Age of Onset  . Anesthesia problems Neg Hx   . Hypotension Neg Hx   . Malignant hyperthermia Neg Hx   . Pseudochol deficiency Neg Hx   . Other Neg Hx   . Depression Mother   . Hypertension Mother   . Diabetes Mother   . Hypertension Father     History  Substance Use Topics  . Smoking status: Current Some Day Smoker -- 0.50 packs/day for 2 years    Types: Cigarettes  . Smokeless tobacco: Never Used  . Alcohol Use: No    OB History   Grav Para Term Preterm Abortions TAB SAB Ect Mult Living   3 2 1 1 1 1  0 0 0 2      Review of Systems  Constitutional: Negative for fever and chills.  HENT: Negative for neck pain and neck stiffness.   Eyes: Positive for photophobia, pain, redness and visual disturbance. Negative for itching.  Neurological:  Positive for headaches ( mild).  All other systems reviewed and are negative.    Allergies  Review of patient's allergies indicates no known allergies.  Home Medications   Current Outpatient Rx  Name  Route  Sig  Dispense  Refill  . albuterol (PROVENTIL HFA;VENTOLIN HFA) 108 (90 BASE) MCG/ACT inhaler   Inhalation   Inhale 2 puffs into the lungs daily as needed for wheezing or shortness of breath.         . MedroxyPROGESTERone Acetate (DEPO-PROVERA IM)   Intramuscular   Inject 1 application into the muscle every 3 (three) months. Last injection beginning of April 2014         . HYDROcodone-acetaminophen (NORCO/VICODIN) 5-325 MG per tablet      Take 1-2 pills every 6 hours as needed for pain.   6 tablet   0   . ibuprofen (ADVIL,MOTRIN) 800 MG tablet   Oral   Take 1 tablet (800 mg total) by mouth 3 (three) times daily.   21 tablet   0     BP 124/74  Pulse 78  Temp(Src) 99 F (37.2 C) (Oral)  Resp 18  SpO2 100%  Physical Exam  Nursing note and vitals reviewed.  Constitutional: She appears well-developed and well-nourished. She appears distressed ( mild).  Pt sitting in darkened room with sunglasses on and guaze over right eye.  HENT:  Head: Normocephalic and atraumatic.  Eyes: EOM and lids are normal. Pupils are equal, round, and reactive to light. No foreign bodies found. Right eye exhibits no chemosis, no discharge, no exudate and no hordeolum. No foreign body present in the right eye. Left eye exhibits no chemosis, no discharge, no exudate and no hordeolum. No foreign body present in the left eye. Right conjunctiva is injected. Right conjunctiva has no hemorrhage. Left conjunctiva is not injected. Left conjunctiva has no hemorrhage. No scleral icterus. Right eye exhibits normal extraocular motion and no nystagmus. Left eye exhibits normal extraocular motion and no nystagmus. Right pupil is round and reactive. Left pupil is round and reactive. Pupils are equal.    Fundoscopic exam:      The right eye shows no papilledema. The right eye shows red reflex.       The left eye shows no papilledema. The left eye shows red reflex.  Slit lamp exam:      The right eye shows no corneal abrasion, no corneal flare, no corneal ulcer, no foreign body, no hyphema, no hypopyon, no fluorescein uptake and no anterior chamber bulge.       The left eye shows no corneal abrasion, no corneal flare, no corneal ulcer, no foreign body, no hyphema, no hypopyon, no fluorescein uptake and no anterior chamber bulge.    Extreme photophobia.  Consensual photophobia.  Pain in right eye after light shined in left eye. Visual acuity: L-20/15, R-20/70, Bilat-20/15  Neck: Normal range of motion.  Cardiovascular: Normal rate, regular rhythm and normal heart sounds.   Pulmonary/Chest: Effort normal and breath sounds normal. No respiratory distress. She has no wheezes.  Musculoskeletal: Normal range of motion.  Neurological: She is alert.  Skin: Skin is warm and dry. She is not diaphoretic.  Psychiatric: She has a normal mood and affect. Her behavior is normal.    ED Course  Procedures (including critical care time)  Labs Reviewed - No data to display No results found.   1. Acute iritis, right eye       MDM  Pt with severe right eye pain started earlier today, progressively worsening.  No known trauma.  Pt afebrile.  Photophobia, consensual photophobia, pain in right eye with light in left eye.  Injected.  No corneal abrasion or foreign bodies seen.     Dr. Preston Fleeting examined pt and agreed with dx of iritis.  Pt is to be seen by ophthalmologist first thing in the morning.  Advised to call office as soon as it opens at 8am for appointment with Dr. Delaney Meigs   Tx: tetracaine, vicodin, and cyclopentolate.   Rx: Vicodin and ibuprofen  Vitals: unremarkable. Discharged in stable condition.    Discussed pt with attending during ED encounter.     Junius Finner, PA-C 03/02/13  [phone]

## 2013-03-01 NOTE — ED Notes (Signed)
Pt reports that she went to sleep with her contacts in last night.  Woke up and her eye was hurting.  pts (R) eye noted to be red, drainage note.  Pt unable to hold eye open for RN assessment.  Reports pain with light.

## 2013-03-02 MED ORDER — IBUPROFEN 800 MG PO TABS: 800.0000 mg | ORAL_TABLET | Freq: Three times a day (TID) | ORAL | Status: DC

## 2013-03-02 MED ORDER — CYCLOPENTOLATE HCL 1 % OP SOLN
2.0000 [drp] | Freq: Once | OPHTHALMIC | Status: AC
  Administered 2013-03-02: 2 [drp] via OPHTHALMIC
  Filled 2013-03-02: qty 2

## 2013-03-02 MED ORDER — HYDROCODONE-ACETAMINOPHEN 5-325 MG PO TABS: ORAL_TABLET | ORAL | Status: DC

## 2013-03-02 NOTE — ED Provider Notes (Signed)
22 year old female suffered a contact lenses in and woke up with severe pain in her right eye. Pain is worse with light exposure even if she keeps the affected eye closed. Vision is blurred. In the ED, it is noted to have erythema the conjunctiva the right eye. He was nonreactive with marked photophobia with light shone in either eye. Slit-lamp exam shows a small number of cells present in the anterior chamber. Overall picture is consistent with iritis. She will be given Cyclogyl eyedrops in the ED and is referred to ophthalmology for followup.  Medical screening examination/treatment/procedure(s) were conducted as a shared visit with non-physician practitioner(s) and myself.  I personally evaluated the patient during the encounter   Dione Booze, MD 03/02/13 [phone]

## 2013-05-12 ENCOUNTER — Encounter (HOSPITAL_COMMUNITY): Payer: Self-pay | Admitting: *Deleted

## 2013-05-12 ENCOUNTER — Emergency Department (INDEPENDENT_AMBULATORY_CARE_PROVIDER_SITE_OTHER)
Admission: EM | Admit: 2013-05-12 | Discharge: 2013-05-12 | Disposition: A | Discharge status: Home / Self Care | Payer: Medicaid Other | Source: Home / Self Care

## 2013-05-12 DIAGNOSIS — H5710 Ocular pain, unspecified eye: Secondary | ICD-10-CM

## 2013-05-12 DIAGNOSIS — H20021 Recurrent acute iridocyclitis, right eye: Secondary | ICD-10-CM

## 2013-05-12 DIAGNOSIS — H20029 Recurrent acute iridocyclitis, unspecified eye: Secondary | ICD-10-CM

## 2013-05-12 DIAGNOSIS — H5711 Ocular pain, right eye: Secondary | ICD-10-CM

## 2013-05-12 MED ORDER — HYDROCODONE-ACETAMINOPHEN 5-325 MG PO TABS
1.0000 | ORAL_TABLET | Freq: Once | ORAL | Status: AC
  Administered 2013-05-12: 1 via ORAL

## 2013-05-12 MED ORDER — HYDROCODONE-ACETAMINOPHEN 5-325 MG PO TABS
ORAL_TABLET | ORAL | Status: AC
  Filled 2013-05-12: qty 1

## 2013-05-12 MED ORDER — TETRACAINE HCL 0.5 % OP SOLN
OPHTHALMIC | Status: AC
  Filled 2013-05-12: qty 2

## 2013-05-12 MED ORDER — MOXIFLOXACIN HCL 0.5 % OP SOLN: 1.0000 [drp] | Freq: Three times a day (TID) | OPHTHALMIC | Status: DC

## 2013-05-12 MED ORDER — HYDROCODONE-ACETAMINOPHEN 5-325 MG PO TABS: 1.0000 | ORAL_TABLET | Freq: Four times a day (QID) | ORAL | Status: DC | PRN

## 2013-05-12 NOTE — ED Notes (Signed)
Reports sleeping in contact lenses - woke up with bilat eye pain and light sensitivity.

## 2013-05-12 NOTE — ED Provider Notes (Signed)
CSN: [id number]     Arrival date & time 05/12/13  1348 History     First MD Initiated Contact with Patient 05/12/13 1502     Chief Complaint  Patient presents with  . Eye Problem   (Consider location/radiation/quality/duration/timing/severity/associated sxs/prior Treatment) HPI Comments: 22 year old female presents complaining of right eye pain that began morning. She states she slept in her contacts last night and woke up this morning with blurry vision and eye pain. He is very sensitive to light and is causing her to have a headache on the right side as well. She states she has many problems over the past 2 months with her eyes since getting new contacts. She has pain in the eye and increasing pain when it it tries to focus. She says her vision is extremely blurry. The eye is also very red. She has a history of conjunctivitis and iritis in the past few months.  Patient is a 22 y.o. female presenting with eye problem.  Eye Problem Associated symptoms: discharge, headaches, photophobia and redness   Associated symptoms: no nausea, no vomiting and no weakness     Past Medical History  Diagnosis Date  . Pregnancy induced hypertension   . Depression   . Headache(784.0)   . Asthma   . Urinary tract infection   . Abnormal Pap smear   . Human papilloma virus   . Trichomonas   . Acid reflux    Past Surgical History  Procedure Laterality Date  . No past surgeries    . Induced abortion  March 2013   Family History  Problem Relation Age of Onset  . Anesthesia problems Neg Hx   . Hypotension Neg Hx   . Malignant hyperthermia Neg Hx   . Pseudochol deficiency Neg Hx   . Other Neg Hx   . Depression Mother   . Hypertension Mother   . Diabetes Mother   . Hypertension Father    History  Substance Use Topics  . Smoking status: Current Some Day Smoker -- 0.50 packs/day for 2 years    Types: Cigarettes  . Smokeless tobacco: Never Used  . Alcohol Use: No   OB History   Grav Para  Term Preterm Abortions TAB SAB Ect Mult Living   3 2 1 1 1 1  0 0 0 2     Review of Systems  Constitutional: Negative for fever and chills.  Eyes: Positive for photophobia, pain, discharge, redness and visual disturbance.  Respiratory: Negative for cough and shortness of breath.   Cardiovascular: Negative for chest pain, palpitations and leg swelling.  Gastrointestinal: Negative for nausea, vomiting and abdominal pain.  Endocrine: Negative for polydipsia and polyuria.  Genitourinary: Negative for dysuria, urgency and frequency.  Musculoskeletal: Negative for myalgias and arthralgias.  Skin: Negative for rash.  Neurological: Positive for headaches. Negative for dizziness, weakness and light-headedness.    Allergies  Review of patient's allergies indicates no known allergies.  Home Medications   Current Outpatient Rx  Name  Route  Sig  Dispense  Refill  . albuterol (PROVENTIL HFA;VENTOLIN HFA) 108 (90 BASE) MCG/ACT inhaler   Inhalation   Inhale 2 puffs into the lungs daily as needed for wheezing or shortness of breath.         Marland Kitchen ibuprofen (ADVIL,MOTRIN) 800 MG tablet   Oral   Take 1 tablet (800 mg total) by mouth 3 (three) times daily.   21 tablet   0   . MedroxyPROGESTERone Acetate (DEPO-PROVERA IM)   Intramuscular  Inject 1 application into the muscle every 3 (three) months. Last injection beginning of April 2014         . HYDROcodone-acetaminophen (NORCO) 5-325 MG per tablet   Oral   Take 1 tablet by mouth every 6 (six) hours as needed for pain.   15 tablet   0   . HYDROcodone-acetaminophen (NORCO/VICODIN) 5-325 MG per tablet      Take 1-2 pills every 6 hours as needed for pain.   6 tablet   0   . moxifloxacin (VIGAMOX) 0.5 % ophthalmic solution   Right Eye   Place 1 drop into the right eye 3 (three) times daily.   3 mL   0     Every 2-3 hrs today and tomorrow, then decreasing  ...    BP 133/81  Pulse 78  Temp(Src) 98.6 F (37 C) (Oral)  Resp 18   SpO2 98%  LMP 05/09/2013  Breastfeeding? No Physical Exam  Constitutional: She is oriented to person, place, and time. She appears well-developed and well-nourished. She appears distressed.  HENT:  Head: Normocephalic and atraumatic.  Eyes: Pupils are equal, round, and reactive to light. Right eye exhibits discharge. Right conjunctiva is injected.  Slit lamp exam:      The right eye shows no fluorescein uptake and no anterior chamber bulge.       The left eye shows no fluorescein uptake and no anterior chamber bulge.  There is direct and consensual photophobia  Neck: Normal range of motion. Neck supple.  Lymphadenopathy:    She has no cervical adenopathy.  Neurological: She is alert and oriented to person, place, and time. No cranial nerve deficit. Coordination normal.  Skin: Skin is warm and dry. No rash noted.  Psychiatric: She has a normal mood and affect. Judgment normal.    ED Course   Procedures (including critical care time)  Labs Reviewed - No data to display No results found. 1. Eye pain, right   2. Iritis, recurrent, right     MDM  Spoke with the ophthalmologist on call, this is probably a combination of conjunctivitis and conjunctival edema from an appropriate contact usage. There may be a recurrence of her iritis as well. Treat with antibiotic drops, he recommends Vigamox. Followup in his office on Monday.   Meds ordered this encounter  Medications  . HYDROcodone-acetaminophen (NORCO/VICODIN) 5-325 MG per tablet 1 tablet    Sig:   . moxifloxacin (VIGAMOX) 0.5 % ophthalmic solution    Sig: Place 1 drop into the right eye 3 (three) times daily.    Dispense:  3 mL    Refill:  0    Every 2-3 hrs today and tomorrow, then decreasing to TID  . HYDROcodone-acetaminophen (NORCO) 5-325 MG per tablet    Sig: Take 1 tablet by mouth every 6 (six) hours as needed for pain.    Dispense:  15 tablet    Refill:  0     Graylon Good, PA-C 05/13/13 [phone]

## 2013-05-13 ENCOUNTER — Encounter (HOSPITAL_COMMUNITY): Payer: Self-pay | Admitting: Emergency Medicine

## 2013-05-13 ENCOUNTER — Telehealth (HOSPITAL_COMMUNITY): Payer: Self-pay | Admitting: *Deleted

## 2013-05-13 ENCOUNTER — Emergency Department (HOSPITAL_COMMUNITY)
Admission: EM | Admit: 2013-05-13 | Discharge: 2013-05-13 | Disposition: A | Discharge status: Home / Self Care | Payer: Medicaid Other | Attending: Emergency Medicine | Admitting: Emergency Medicine

## 2013-05-13 DIAGNOSIS — Z8742 Personal history of other diseases of the female genital tract: Secondary | ICD-10-CM | POA: Insufficient documentation

## 2013-05-13 DIAGNOSIS — H5789 Other specified disorders of eye and adnexa: Secondary | ICD-10-CM | POA: Insufficient documentation

## 2013-05-13 DIAGNOSIS — H53149 Visual discomfort, unspecified: Secondary | ICD-10-CM | POA: Insufficient documentation

## 2013-05-13 DIAGNOSIS — Z8619 Personal history of other infectious and parasitic diseases: Secondary | ICD-10-CM | POA: Insufficient documentation

## 2013-05-13 DIAGNOSIS — Z79899 Other long term (current) drug therapy: Secondary | ICD-10-CM | POA: Insufficient documentation

## 2013-05-13 DIAGNOSIS — F3289 Other specified depressive episodes: Secondary | ICD-10-CM | POA: Insufficient documentation

## 2013-05-13 DIAGNOSIS — F329 Major depressive disorder, single episode, unspecified: Secondary | ICD-10-CM | POA: Insufficient documentation

## 2013-05-13 DIAGNOSIS — Z8719 Personal history of other diseases of the digestive system: Secondary | ICD-10-CM | POA: Insufficient documentation

## 2013-05-13 DIAGNOSIS — H20023 Recurrent acute iridocyclitis, bilateral: Secondary | ICD-10-CM

## 2013-05-13 DIAGNOSIS — J45909 Unspecified asthma, uncomplicated: Secondary | ICD-10-CM | POA: Insufficient documentation

## 2013-05-13 DIAGNOSIS — F17200 Nicotine dependence, unspecified, uncomplicated: Secondary | ICD-10-CM | POA: Insufficient documentation

## 2013-05-13 DIAGNOSIS — Z87440 Personal history of urinary (tract) infections: Secondary | ICD-10-CM | POA: Insufficient documentation

## 2013-05-13 DIAGNOSIS — H209 Unspecified iridocyclitis: Secondary | ICD-10-CM | POA: Insufficient documentation

## 2013-05-13 MED ORDER — ATROPINE SULFATE 1 % OP SOLN
1.0000 [drp] | Freq: Once | OPHTHALMIC | Status: AC
  Administered 2013-05-13: 1 [drp] via OPHTHALMIC
  Filled 2013-05-13: qty 2

## 2013-05-13 MED ORDER — OXYCODONE-ACETAMINOPHEN 5-325 MG PO TABS: 1.0000 | ORAL_TABLET | Freq: Once | ORAL | Status: DC

## 2013-05-13 MED ORDER — OXYCODONE-ACETAMINOPHEN 5-325 MG PO TABS
1.0000 | ORAL_TABLET | Freq: Once | ORAL | Status: AC
  Administered 2013-05-13: 1 via ORAL
  Filled 2013-05-13 (×2): qty 1

## 2013-05-13 NOTE — ED Notes (Signed)
Patient presents to ED with complaints of eye irritation in both eyes. Patient states she went to sleep with contacts in on Friday night and was seen at [facility] Saturday at states her eyes are worse now.

## 2013-05-13 NOTE — ED Notes (Signed)
PA stated it would be ok to hold off on visual acuity until pt's eye pain was better.

## 2013-05-13 NOTE — ED Notes (Signed)
Pt's previous prescription of vicodin disposed of by EDPA in sharps container.

## 2013-05-13 NOTE — ED Provider Notes (Signed)
CSN: [id number]     Arrival date & time 05/13/13  4098 History  This chart was scribed for Tommy Rainwater, non-physician practitioner working with Richardean Canal, MD by Leone Payor, ED Scribe. This patient was seen in room TR04C/TR04C and the patient's care was started at 1822.    Chief Complaint  Patient presents with  . Eye Problem    The history is provided by the patient. No language interpreter was used.    HPI Comments: Kristin Powers is a 22 y.o. female who presents to the Emergency Department complaining of constant, gradually worsening bilateral eye pain starting 1 days ago. Pt states she fell asleep with contacts in on 05/11/13 and was seen at Urgent Care on 05/12/13 and was prescribed antibiotic drops. Pt states both eyes are red and were mildly matted together this morning. Pt has associated photophobia. Pt has history of iritis and pink eye.    Past Medical History  Diagnosis Date  . Pregnancy induced hypertension   . Depression   . Headache(784.0)   . Asthma   . Urinary tract infection   . Abnormal Pap smear   . Human papilloma virus   . Trichomonas   . Acid reflux    Past Surgical History  Procedure Laterality Date  . No past surgeries    . Induced abortion  March 2013   Family History  Problem Relation Age of Onset  . Anesthesia problems Neg Hx   . Hypotension Neg Hx   . Malignant hyperthermia Neg Hx   . Pseudochol deficiency Neg Hx   . Other Neg Hx   . Depression Mother   . Hypertension Mother   . Diabetes Mother   . Hypertension Father    History  Substance Use Topics  . Smoking status: Current Some Day Smoker -- 0.50 packs/day for 2 years    Types: Cigarettes  . Smokeless tobacco: Never Used  . Alcohol Use: No   OB History   Grav Para Term Preterm Abortions TAB SAB Ect Mult Living   3 2 1 1 1 1  0 0 0 2     Review of Systems  Eyes: Positive for photophobia, pain and redness. Negative for discharge and itching.  Neurological: Negative for  headaches.  All other systems reviewed and are negative.    Allergies  Review of patient's allergies indicates no known allergies.  Home Medications   Current Outpatient Rx  Name  Route  Sig  Dispense  Refill  . albuterol (PROVENTIL HFA;VENTOLIN HFA) 108 (90 BASE) MCG/ACT inhaler   Inhalation   Inhale 2 puffs into the lungs daily as needed for wheezing or shortness of breath.         Marland Kitchen HYDROcodone-acetaminophen (NORCO/VICODIN) 5-325 MG per tablet   Oral   Take 1 tablet by mouth every 6 (six) hours as needed for pain.         . MedroxyPROGESTERone Acetate (DEPO-PROVERA IM)   Intramuscular   Inject 1 application into the muscle every 3 (three) months. Last injection beginning of April 2014         . moxifloxacin (VIGAMOX) 0.5 % ophthalmic solution   Both Eyes   Place 1 drop into both eyes 3 (three) times daily.          BP 107/79  Pulse 77  Temp(Src) 98.6 F (37 C) (Oral)  Resp 16  SpO2 100%  LMP 05/09/2013 Physical Exam  Eyes: Right eye exhibits no discharge. Left eye exhibits  no discharge.  Moderately significant photophobia to eyes bilaterally with excessive tearing. No purulent discharge. Cornea clear bilaterally. No conjunctival swelling. Direct and consensual pain with accomodation. Conjunctiva significantly erythematous bilaterally with chemosis.     ED Course   Procedures (including critical care time)  DIAGNOSTIC STUDIES: Oxygen Saturation is 100% on RA, normal by my interpretation.    COORDINATION OF CARE: 7:25 PM Discussed treatment plan with pt at bedside and pt agreed to plan. Will discuss with Dr. Silverio Lay about dilating her eyes.   Labs Reviewed - No data to display No results found. No diagnosis found. 1. Bilateral iritis  MDM  Patient with a history of iritis and recent treatment for conjunctivitis after leaving contacts in place. Pain improved with administration of atropine, suspect recurrent iritis. She will see Dr. Karleen Hampshire tomorrow.   I  personally performed the services described in this documentation, which was scribed in my presence. The recorded information has been reviewed and is accurate.     Arnoldo Hooker, PA-C 05/13/13 2241

## 2013-05-13 NOTE — ED Provider Notes (Signed)
Medical screening examination/treatment/procedure(s) were performed by non-physician practitioner and as supervising physician I was immediately available for consultation/collaboration.   Richardean Canal, MD 05/13/13 [phone]

## 2013-05-17 NOTE — ED Provider Notes (Signed)
Medical screening examination/treatment/procedure(s) were performed by a resident physician or non-physician practitioner and as the supervising physician I was immediately available for consultation/collaboration.  Rajean Desantiago, MD   Pantera Winterrowd S Anakin Varkey, MD 05/17/13 1438 

## 2014-01-31 ENCOUNTER — Ambulatory Visit: Payer: Self-pay

## 2014-08-05 ENCOUNTER — Encounter (HOSPITAL_COMMUNITY): Payer: Self-pay | Admitting: Emergency Medicine

## 2015-02-19 ENCOUNTER — Encounter (HOSPITAL_COMMUNITY): Payer: Self-pay | Admitting: *Deleted

## 2015-02-19 ENCOUNTER — Inpatient Hospital Stay (HOSPITAL_COMMUNITY)
Admission: AD | Admit: 2015-02-19 | Discharge: 2015-02-19 | Disposition: A | Discharge status: Home / Self Care | Payer: Self-pay | Source: Ambulatory Visit | Attending: Obstetrics & Gynecology | Admitting: Obstetrics & Gynecology

## 2015-02-19 ENCOUNTER — Inpatient Hospital Stay (HOSPITAL_COMMUNITY): Payer: Self-pay

## 2015-02-19 DIAGNOSIS — O9989 Other specified diseases and conditions complicating pregnancy, childbirth and the puerperium: Secondary | ICD-10-CM | POA: Insufficient documentation

## 2015-02-19 DIAGNOSIS — F17210 Nicotine dependence, cigarettes, uncomplicated: Secondary | ICD-10-CM | POA: Insufficient documentation

## 2015-02-19 DIAGNOSIS — R109 Unspecified abdominal pain: Secondary | ICD-10-CM | POA: Insufficient documentation

## 2015-02-19 DIAGNOSIS — O26899 Other specified pregnancy related conditions, unspecified trimester: Secondary | ICD-10-CM

## 2015-02-19 DIAGNOSIS — O99331 Smoking (tobacco) complicating pregnancy, first trimester: Secondary | ICD-10-CM | POA: Insufficient documentation

## 2015-02-19 DIAGNOSIS — Z3A01 Less than 8 weeks gestation of pregnancy: Secondary | ICD-10-CM | POA: Insufficient documentation

## 2015-02-19 LAB — URINALYSIS, ROUTINE W REFLEX MICROSCOPIC
Bilirubin Urine: NEGATIVE
GLUCOSE, UA: NEGATIVE mg/dL
Hgb urine dipstick: NEGATIVE
KETONES UR: NEGATIVE mg/dL
LEUKOCYTES UA: NEGATIVE
Nitrite: POSITIVE — AB
PROTEIN: NEGATIVE mg/dL
Specific Gravity, Urine: 1.025 (ref 1.005–1.030)
UROBILINOGEN UA: 0.2 mg/dL (ref 0.0–1.0)
pH: 6 (ref 5.0–8.0)

## 2015-02-19 LAB — WET PREP, GENITAL
Trich, Wet Prep: NONE SEEN
Yeast Wet Prep HPF POC: NONE SEEN

## 2015-02-19 LAB — CBC WITH DIFFERENTIAL/PLATELET
BASOS ABS: 0 10*3/uL (ref 0.0–0.1)
Basophils Relative: 0 % (ref 0–1)
EOS PCT: 1 % (ref 0–5)
Eosinophils Absolute: 0.1 10*3/uL (ref 0.0–0.7)
HCT: 34 % — ABNORMAL LOW (ref 36.0–46.0)
Hemoglobin: 11.6 g/dL — ABNORMAL LOW (ref 12.0–15.0)
Lymphocytes Relative: 27 % (ref 12–46)
Lymphs Abs: 2.5 10*3/uL (ref 0.7–4.0)
MCH: 29.6 pg (ref 26.0–34.0)
MCHC: 34.1 g/dL (ref 30.0–36.0)
MCV: 86.7 fL (ref 78.0–100.0)
Monocytes Absolute: 0.4 10*3/uL (ref 0.1–1.0)
Monocytes Relative: 5 % (ref 3–12)
NEUTROS ABS: 6.2 10*3/uL (ref 1.7–7.7)
Neutrophils Relative %: 67 % (ref 43–77)
PLATELETS: 244 10*3/uL (ref 150–400)
RBC: 3.92 MIL/uL (ref 3.87–5.11)
RDW: 13.3 % (ref 11.5–15.5)
WBC: 9.3 10*3/uL (ref 4.0–10.5)

## 2015-02-19 LAB — URINE MICROSCOPIC-ADD ON

## 2015-02-19 LAB — HCG, QUANTITATIVE, PREGNANCY: hCG, Beta Chain, Quant, S: 7617 m[IU]/mL — ABNORMAL HIGH (ref <5)

## 2015-02-19 LAB — POCT PREGNANCY, URINE: PREG TEST UR: POSITIVE — AB

## 2015-02-19 NOTE — MAU Note (Addendum)
Came to get proof of preg. +HPT on 05/10. abd pain and back pain- started a wk ago. (does want to be checked out)

## 2015-02-19 NOTE — Discharge Instructions (Signed)
Prenatal Care Providers °[facility]    [facility] ° Phone- [phone]     Phone: [phone] °         °[facility]                      [facility] ° @[place]     Phone: [phone] ° Phone: [phone] °        [facility] °[facility]     Phone: [phone] ° Phone: [phone]   °        [facility] °[facility] [place]                hone: [phone] ° Phone: [phone] °        [facility] °Dr. Bernard Marshall      Phone: [phone] ° Phone: [phone] °         [facility] °[facility]                Phone: [phone] ° [facility]’s [facility]  ° Phone:[phone]    [facility] °         Phone: [phone] °[facility] °  Phone: [phone] °Safe Medications in Pregnancy  ° °Acne: °Benzoyl Peroxide °Salicylic Acid ° °Backache/Headache: °Tylenol: 2 regular strength every 4 hours OR °             2 Extra strength every 6 hours ° °Colds/Coughs/Allergies: °Benadryl (alcohol free) 25 mg every 6 hours as needed °Breath right strips °Claritin °Cepacol throat lozenges °Chloraseptic throat spray °Cold-Eeze- up to three times per day °Cough drops, alcohol free °Flonase (by prescription only) °Guaifenesin °Mucinex °Robitussin DM (plain only, alcohol free) °Saline nasal spray/drops °Sudafed (pseudoephedrine) & Actifed ** use only after [redacted] weeks gestation and if you do not have high blood pressure °Tylenol °Vicks Vaporub °Zinc lozenges °Zyrtec  ° °Constipation: °Colace °Ducolax suppositories °Fleet enema °Glycerin suppositories °Metamucil °Milk of magnesia °Miralax °Senokot °Smooth move tea ° °Diarrhea: °Kaopectate °Imodium A-D ° °*NO pepto Bismol ° °Hemorrhoids: °Anusol °Anusol HC °Preparation H °Tucks ° °Indigestion: °Tums °Maalox °Mylanta °Zantac  °Pepcid ° °Insomnia: °Benadryl (alcohol free) 25mg every 6 hours as needed °Tylenol  PM °Unisom, no Gelcaps ° °Leg Cramps: °Tums °MagGel ° °Nausea/Vomiting:  °Bonine °Dramamine °Emetrol °Ginger extract °Sea bands °Meclizine  °Nausea medication to take during pregnancy:  °Unisom (doxylamine succinate 25 mg tablets) Take one tablet daily at bedtime. If symptoms are not adequately controlled, the dose can be increased to a maximum recommended dose of two tablets daily (1/2 tablet in the morning, 1/2 tablet mid-afternoon and one at bedtime). °Vitamin B6 100mg tablets. Take one tablet twice a day (up to 200 mg per day). ° °Skin Rashes: °Aveeno products °Benadryl cream or 25mg every 6 hours as needed °Calamine Lotion °1% cortisone cream ° °Yeast infection: °Gyne-lotrimin 7 °Monistat 7 ° ° °**If taking multiple medications, please check labels to avoid duplicating the same active ingredients °**take medication as directed on the label °** Do not exceed 4000 mg of tylenol in 24 hours °**Do not take medications that contain aspirin or ibuprofen ° ° °First Trimester of Pregnancy °The first trimester of pregnancy is from week 1 until the end of week 12 (months 1 through 3). A week after a sperm fertilizes an egg, the egg will implant on the wall of the uterus. This embryo will   begin to develop into a baby. Genes from you and your partner are forming the baby. The female genes determine whether the baby is a boy or a girl. At 6-8 weeks, the eyes and face are formed, and the heartbeat can be seen on ultrasound. At the end of 12 weeks, all the baby's organs are formed.  °Now that you are pregnant, you will want to do everything you can to have a healthy baby. Two of the most important things are to get good prenatal care and to follow your health care provider's instructions. Prenatal care is all the medical care you receive before the baby's birth. This care will help prevent, find, and treat any problems during the pregnancy and childbirth. °BODY CHANGES °Your body goes through many changes during pregnancy. The  changes vary from woman to woman.  °· You may gain or lose a couple of pounds at first. °· You may feel sick to your stomach (nauseous) and throw up (vomit). If the vomiting is uncontrollable, call your health care provider. °· You may tire easily. °· You may develop headaches that can be relieved by medicines approved by your health care provider. °· You may urinate more often. Painful urination may mean you have a bladder infection. °· You may develop heartburn as a result of your pregnancy. °· You may develop constipation because certain hormones are causing the muscles that push waste through your intestines to slow down. °· You may develop hemorrhoids or swollen, bulging veins (varicose veins). °· Your breasts may begin to grow larger and become tender. Your nipples may stick out more, and the tissue that surrounds them (areola) may become darker. °· Your gums may bleed and may be sensitive to brushing and flossing. °· Dark spots or blotches (chloasma, mask of pregnancy) may develop on your face. This will likely fade after the baby is born. °· Your menstrual periods will stop. °· You may have a loss of appetite. °· You may develop cravings for certain kinds of food. °· You may have changes in your emotions from day to day, such as being excited to be pregnant or being concerned that something may go wrong with the pregnancy and baby. °· You may have more vivid and strange dreams. °· You may have changes in your hair. These can include thickening of your hair, rapid growth, and changes in texture. Some women also have hair loss during or after pregnancy, or hair that feels dry or thin. Your hair will most likely return to normal after your baby is born. °WHAT TO EXPECT AT YOUR PRENATAL VISITS °During a routine prenatal visit: °· You will be weighed to make sure you and the baby are growing normally. °· Your blood pressure will be taken. °· Your abdomen will be measured to track your baby's growth. °· The fetal  heartbeat will be listened to starting around week 10 or 12 of your pregnancy. °· Test results from any previous visits will be discussed. °Your health care provider may ask you: °· How you are feeling. °· If you are feeling the baby move. °· If you have had any abnormal symptoms, such as leaking fluid, bleeding, severe headaches, or abdominal cramping. °· If you have any questions. °Other tests that may be performed during your first trimester include: °· Blood tests to find your blood type and to check for the presence of any previous infections. They will also be used to check for low iron levels (anemia) and Rh antibodies. Later   in the pregnancy, blood tests for diabetes will be done along with other tests if problems develop. °· Urine tests to check for infections, diabetes, or protein in the urine. °· An ultrasound to confirm the proper growth and development of the baby. °· An amniocentesis to check for possible genetic problems. °· Fetal screens for spina bifida and Down syndrome. °· You may need other tests to make sure you and the baby are doing well. °HOME CARE INSTRUCTIONS  °Medicines °· Follow your health care provider's instructions regarding medicine use. Specific medicines may be either safe or unsafe to take during pregnancy. °· Take your prenatal vitamins as directed. °· If you develop constipation, try taking a stool softener if your health care provider approves. °Diet °· Eat regular, well-balanced meals. Choose a variety of foods, such as meat or vegetable-based protein, fish, milk and low-fat dairy products, vegetables, fruits, and whole grain breads and cereals. Your health care provider will help you determine the amount of weight gain that is right for you. °· Avoid raw meat and uncooked cheese. These carry germs that can cause birth defects in the baby. °· Eating four or five small meals rather than three large meals a day may help relieve nausea and vomiting. If you start to feel nauseous,  eating a few soda crackers can be helpful. Drinking liquids between meals instead of during meals also seems to help nausea and vomiting. °· If you develop constipation, eat more high-fiber foods, such as fresh vegetables or fruit and whole grains. Drink enough fluids to keep your urine clear or pale yellow. °Activity and Exercise °· Exercise only as directed by your health care provider. Exercising will help you: °¨ Control your weight. °¨ Stay in shape. °¨ Be prepared for labor and delivery. °· Experiencing pain or cramping in the lower abdomen or low back is a good sign that you should stop exercising. Check with your health care provider before continuing normal exercises. °· Try to avoid standing for long periods of time. Move your legs often if you must stand in one place for a long time. °· Avoid heavy lifting. °· Wear low-heeled shoes, and practice good posture. °· You may continue to have sex unless your health care provider directs you otherwise. °Relief of Pain or Discomfort °· Wear a good support bra for breast tenderness.   °· Take warm sitz baths to soothe any pain or discomfort caused by hemorrhoids. Use hemorrhoid cream if your health care provider approves.   °· Rest with your legs elevated if you have leg cramps or low back pain. °· If you develop varicose veins in your legs, wear support hose. Elevate your feet for 15 minutes, 3-4 times a day. Limit salt in your diet. °Prenatal Care °· Schedule your prenatal visits by the twelfth week of pregnancy. They are usually scheduled monthly at first, then more often in the last 2 months before delivery. °· Write down your questions. Take them to your prenatal visits. °· Keep all your prenatal visits as directed by your health care provider. °Safety °· Wear your seat belt at all times when driving. °· Make a list of emergency phone numbers, including numbers for family, friends, the hospital, and police and fire departments. °General Tips °· Ask your  health care provider for a referral to a local prenatal education class. Begin classes no later than at the beginning of month 6 of your pregnancy. °· Ask for help if you have counseling or nutritional needs during pregnancy. Your   health care provider can offer advice or refer you to specialists for help with various needs. °· Do not use hot tubs, steam rooms, or saunas. °· Do not douche or use tampons or scented sanitary pads. °· Do not cross your legs for long periods of time. °· Avoid cat litter boxes and soil used by cats. These carry germs that can cause birth defects in the baby and possibly loss of the fetus by miscarriage or stillbirth. °· Avoid all smoking, herbs, alcohol, and medicines not prescribed by your health care provider. Chemicals in these affect the formation and growth of the baby. °· Schedule a dentist appointment. At home, brush your teeth with a soft toothbrush and be gentle when you floss. °SEEK MEDICAL CARE IF:  °· You have dizziness. °· You have mild pelvic cramps, pelvic pressure, or nagging pain in the abdominal area. °· You have persistent nausea, vomiting, or diarrhea. °· You have a bad smelling vaginal discharge. °· You have pain with urination. °· You notice increased swelling in your face, hands, legs, or ankles. °SEEK IMMEDIATE MEDICAL CARE IF:  °· You have a fever. °· You are leaking fluid from your vagina. °· You have spotting or bleeding from your vagina. °· You have severe abdominal cramping or pain. °· You have rapid weight gain or loss. °· You vomit blood or material that looks like coffee grounds. °· You are exposed to German measles and have never had them. °· You are exposed to fifth disease or chickenpox. °· You develop a severe headache. °· You have shortness of breath. °· You have any kind of trauma, such as from a fall or a car accident. °Document Released: 09/14/2001 Document Revised: 02/04/2014 Document Reviewed: 07/31/2013 °ExitCare® Patient Information ©2015  [employer]. This information is not intended to replace advice given to you by your health care provider. Make sure you discuss any questions you have with your health care provider. ° °

## 2015-02-19 NOTE — MAU Provider Note (Signed)
History     CSN: [id number]  Arrival date and time: 02/19/15 1641   First Provider Initiated Contact with Patient 02/19/15 1920      Chief Complaint  Patient presents with  . Possible Pregnancy   HPI Kristin Powers 24 y.o. [id number] @[redacted]w[redacted]d  presents to MAU after having positive home pregnancy test x 2 and now with back pain and abdominal pain.  The pain started a week ago and is lower left abdomen.  Pain is 3-4/10 but sometimes worsens to 6/10.  She does have pain-free times but it is happening every day.  It feels like a cramping pain. She sometimes feels like she ate something bad.  She denies nausea, vomiting, fever, weakness, vaginal bleeding or discharge, dysuria.   OB History    Gravida Para Term Preterm AB TAB SAB Ectopic Multiple Living   4 2 1 1 1 1  0 0 0 2      Past Medical History  Diagnosis Date  . Pregnancy induced hypertension   . Depression   . Headache(784.0)   . Asthma   . Urinary tract infection   . Abnormal Pap smear   . Human papilloma virus   . Trichomonas   . Acid reflux     Past Surgical History  Procedure Laterality Date  . No past surgeries    . Induced abortion  March 2013    Family History  Problem Relation Age of Onset  . Anesthesia problems Neg Hx   . Hypotension Neg Hx   . Malignant hyperthermia Neg Hx   . Pseudochol deficiency Neg Hx   . Other Neg Hx   . Depression Mother   . Hypertension Mother   . Diabetes Mother   . Hypertension Father     History  Substance Use Topics  . Smoking status: Current Some Day Smoker -- 0.50 packs/day for 2 years    Types: Cigarettes  . Smokeless tobacco: Never Used  . Alcohol Use: No    Allergies: No Known Allergies  Prescriptions prior to admission  Medication Sig Dispense Refill Last Dose  . albuterol (PROVENTIL HFA;VENTOLIN HFA) 108 (90 BASE) MCG/ACT inhaler Inhale 2 puffs into the lungs daily as needed for wheezing or shortness of breath.   about a year  . HYDROcodone-acetaminophen  (NORCO/VICODIN) 5-325 MG per tablet Take 1 tablet by mouth every 6 (six) hours as needed for pain.   05/13/2013 at Unknown  . MedroxyPROGESTERone Acetate (DEPO-PROVERA IM) Inject 1 application into the muscle every 3 (three) months. Last injection beginning of April 2014   07/14 at Unknown  . moxifloxacin (VIGAMOX) 0.5 % ophthalmic solution Place 1 drop into both eyes 3 (three) times daily.   05/13/2013 at Unknown  . oxyCODONE-acetaminophen (PERCOCET/ROXICET) 5-325 MG per tablet Take 1 tablet by mouth once. 20 tablet 0     ROS Pertinent ROS in HPI.  All other systems are negative.   Physical Exam   Blood pressure 141/72, pulse 86, temperature 98.7 F (37.1 C), temperature source Oral, resp. rate 20, height 5\' 3"  (1.6 m), weight 172 lb (78.019 kg), last menstrual period 01/12/2015.  Physical Exam  Constitutional: She is oriented to person, place, and time. She appears well-developed and well-nourished. No distress.  HENT:  Head: Normocephalic and atraumatic.  Eyes: EOM are normal.  Neck: Normal range of motion.  Cardiovascular: Normal rate, regular rhythm and normal heart sounds.   Respiratory: Effort normal and breath sounds normal. No respiratory distress.  GI: Soft. Bowel sounds are  normal. She exhibits no distension. There is no tenderness. There is no rebound and no guarding.  Genitourinary:  Scant white discharge Cervix is closed.  No CMT.  No adnexal mass or tenderness  Musculoskeletal: Normal range of motion.  Neurological: She is alert and oriented to person, place, and time.  Skin: Skin is warm and dry.  Psychiatric: She has a normal mood and affect.   Results for orders placed or performed during the hospital encounter of 02/19/15 (from the past 24 hour(s))  Urinalysis, Routine w reflex microscopic     Status: Abnormal   Collection Time: 02/19/15  5:12 PM  Result Value Ref Range   Color, Urine YELLOW YELLOW   APPearance CLEAR CLEAR   Specific Gravity, Urine 1.025 1.005 -  1.030   pH 6.0 5.0 - 8.0   Glucose, UA NEGATIVE NEGATIVE mg/dL   Hgb urine dipstick NEGATIVE NEGATIVE   Bilirubin Urine NEGATIVE NEGATIVE   Ketones, ur NEGATIVE NEGATIVE mg/dL   Protein, ur NEGATIVE NEGATIVE mg/dL   Urobilinogen, UA 0.2 0.0 - 1.0 mg/dL   Nitrite POSITIVE (A) NEGATIVE   Leukocytes, UA NEGATIVE NEGATIVE  Urine microscopic-add on     Status: Abnormal   Collection Time: 02/19/15  5:12 PM  Result Value Ref Range   Squamous Epithelial / LPF MANY (A) RARE   WBC, UA 0-2 <3 WBC/hpf   Bacteria, UA MANY (A) RARE  Pregnancy, urine POC     Status: Abnormal   Collection Time: 02/19/15  5:14 PM  Result Value Ref Range   Preg Test, Ur POSITIVE (A) NEGATIVE  Rh IG workup (includes ABO/Rh)     Status: None (Preliminary result)   Collection Time: 02/19/15  5:45 PM  Result Value Ref Range   Gestational Age(Wks) 5    ABO/RH(D) O NEG    Antibody Screen PENDING   Wet prep, genital     Status: Abnormal   Collection Time: 02/19/15  7:30 PM  Result Value Ref Range   Yeast Wet Prep HPF POC NONE SEEN NONE SEEN   Trich, Wet Prep NONE SEEN NONE SEEN   Clue Cells Wet Prep HPF POC FEW (A) NONE SEEN   WBC, Wet Prep HPF POC FEW (A) NONE SEEN  CBC with Differential/Platelet     Status: Abnormal   Collection Time: 02/19/15  7:38 PM  Result Value Ref Range   WBC 9.3 4.0 - 10.5 K/uL   RBC 3.92 3.87 - 5.11 MIL/uL   Hemoglobin 11.6 (L) 12.0 - 15.0 g/dL   HCT 16.134.0 (L) 09.636.0 - 04.546.0 %   MCV 86.7 78.0 - 100.0 fL   MCH 29.6 26.0 - 34.0 pg   MCHC 34.1 30.0 - 36.0 g/dL   RDW 40.913.3 81.111.5 - 91.415.5 %   Platelets 244 150 - 400 K/uL   Neutrophils Relative % 67 43 - 77 %   Neutro Abs 6.2 1.7 - 7.7 K/uL   Lymphocytes Relative 27 12 - 46 %   Lymphs Abs 2.5 0.7 - 4.0 K/uL   Monocytes Relative 5 3 - 12 %   Monocytes Absolute 0.4 0.1 - 1.0 K/uL   Eosinophils Relative 1 0 - 5 %   Eosinophils Absolute 0.1 0.0 - 0.7 K/uL   Basophils Relative 0 0 - 1 %   Basophils Absolute 0.0 0.0 - 0.1 K/uL  hCG, quantitative,  pregnancy     Status: Abnormal   Collection Time: 02/19/15  7:38 PM  Result Value Ref Range   hCG, Beta Chain,  Quant, [place] 1610 (H) <5 mIU/mL   US Ob Comp Less 14 Wks  02/19/2015   CLINICAL DATA:  First trimester of pregnancy, abdominal pain for 1 week.  EXAM: OBSTETRIC <14 WK [place] AND TRANSVAGINAL OB US  TECHNIQUE: Both transabdominal and transvaginal ultrasound examinations were performed for complete evaluation of the gestation as well as the maternal uterus, adnexal regions, and pelvic cul-de-sac. Transvaginal technique was performed to assess early pregnancy.  COMPARISON:  April 12, 2012.  FINDINGS: Intrauterine gestational sac: Visualized/normal in shape.  Yolk sac:  Visualized.  Embryo:  Not visualized.  Cardiac Activity: Not visualized.  MSD: 8.1  mm   5 w   4  d  Maternal uterus/adnexae: Right ovary appears normal. Left ovary is not visualized. No free fluid is noted.  IMPRESSION: Probable early intrauterine gestational sac with yolk sac, but no fetal pole or cardiac activity yet visualized. Recommend follow-up quantitative B-HCG levels and follow-up US in 14 days to confirm and assess viability. This recommendation follows SRU consensus guidelines: Diagnostic Criteria for Nonviable Pregnancy Early in the First Trimester. Malva Limes Med 2013; 960:4540-98.   Electronically Signed   By: Lupita Raider, M.D.   On: 02/19/2015 20:10   US Ob Transvaginal  02/19/2015   CLINICAL DATA:  First trimester of pregnancy, abdominal pain for 1 week.  EXAM: OBSTETRIC <14 WK [place] AND TRANSVAGINAL OB US  TECHNIQUE: Both transabdominal and transvaginal ultrasound examinations were performed for complete evaluation of the gestation as well as the maternal uterus, adnexal regions, and pelvic cul-de-sac. Transvaginal technique was performed to assess early pregnancy.  COMPARISON:  April 12, 2012.  FINDINGS: Intrauterine gestational sac: Visualized/normal in shape.  Yolk sac:  Visualized.  Embryo:  Not visualized.  Cardiac  Activity: Not visualized.  MSD: 8.1  mm   5 w   4  d  Maternal uterus/adnexae: Right ovary appears normal. Left ovary is not visualized. No free fluid is noted.  IMPRESSION: Probable early intrauterine gestational sac with yolk sac, but no fetal pole or cardiac activity yet visualized. Recommend follow-up quantitative B-HCG levels and follow-up US in 14 days to confirm and assess viability. This recommendation follows SRU consensus guidelines: Diagnostic Criteria for Nonviable Pregnancy Early in the First Trimester. Malva Limes Med 2013; 119:1478-29.   Electronically Signed   By: Lupita Raider, M.D.   On: 02/19/2015 20:10    MAU Course  Procedures  MDM Ectopic workup ordered including labs, pelvic, U/S.   IUP confirmed with presence of yolk sac.  No infection noted.  Pt is Rh negative but no bleeding today.    Assessment and Plan  A:  1. Abdominal pain affecting pregnancy    P: Discharge to home [facility] asap PNV qd OTC Tylenol prn pain/discomfort Patient may return to MAU as needed or if her condition were to change or worsen   Bertram Denver 02/19/2015, 7:20 PM

## 2015-02-19 NOTE — MAU Note (Signed)
Pt had not left, asking for verification letter. Explained that with her complaint of pain, we need to further assess and make sure that she does not have an ectopic or any problems.  Pt verbalized understanding and will stay

## 2015-02-20 LAB — RH IG WORKUP (INCLUDES ABO/RH)
ABO/RH(D): O NEG
Antibody Screen: NEGATIVE
Gestational Age(Wks): 5
Unit division: 0

## 2015-02-20 LAB — GC/CHLAMYDIA PROBE AMP (~~LOC~~) NOT AT ARMC
CHLAMYDIA, DNA PROBE: NEGATIVE
NEISSERIA GONORRHEA: NEGATIVE

## 2015-02-20 LAB — RPR: RPR Ser Ql: NONREACTIVE

## 2015-02-20 LAB — HIV ANTIBODY (ROUTINE TESTING W REFLEX): HIV Screen 4th Generation wRfx: NONREACTIVE

## 2015-04-28 ENCOUNTER — Inpatient Hospital Stay (HOSPITAL_COMMUNITY)
Admission: AD | Admit: 2015-04-28 | Discharge: 2015-04-28 | Disposition: A | Discharge status: Home / Self Care | Payer: Self-pay | Source: Ambulatory Visit | Attending: Obstetrics & Gynecology | Admitting: Obstetrics & Gynecology

## 2015-04-28 ENCOUNTER — Inpatient Hospital Stay (HOSPITAL_COMMUNITY): Payer: Self-pay

## 2015-04-28 ENCOUNTER — Encounter (HOSPITAL_COMMUNITY): Payer: Self-pay | Admitting: *Deleted

## 2015-04-28 DIAGNOSIS — Z3A15 15 weeks gestation of pregnancy: Secondary | ICD-10-CM | POA: Insufficient documentation

## 2015-04-28 DIAGNOSIS — N760 Acute vaginitis: Secondary | ICD-10-CM | POA: Insufficient documentation

## 2015-04-28 DIAGNOSIS — O219 Vomiting of pregnancy, unspecified: Secondary | ICD-10-CM

## 2015-04-28 DIAGNOSIS — O4690 Antepartum hemorrhage, unspecified, unspecified trimester: Secondary | ICD-10-CM | POA: Insufficient documentation

## 2015-04-28 DIAGNOSIS — B9689 Other specified bacterial agents as the cause of diseases classified elsewhere: Secondary | ICD-10-CM | POA: Insufficient documentation

## 2015-04-28 DIAGNOSIS — O99332 Smoking (tobacco) complicating pregnancy, second trimester: Secondary | ICD-10-CM | POA: Insufficient documentation

## 2015-04-28 DIAGNOSIS — F17210 Nicotine dependence, cigarettes, uncomplicated: Secondary | ICD-10-CM | POA: Insufficient documentation

## 2015-04-28 DIAGNOSIS — O209 Hemorrhage in early pregnancy, unspecified: Secondary | ICD-10-CM | POA: Insufficient documentation

## 2015-04-28 DIAGNOSIS — O4692 Antepartum hemorrhage, unspecified, second trimester: Secondary | ICD-10-CM

## 2015-04-28 DIAGNOSIS — O23592 Infection of other part of genital tract in pregnancy, second trimester: Secondary | ICD-10-CM | POA: Insufficient documentation

## 2015-04-28 DIAGNOSIS — O210 Mild hyperemesis gravidarum: Secondary | ICD-10-CM | POA: Insufficient documentation

## 2015-04-28 LAB — URINALYSIS, ROUTINE W REFLEX MICROSCOPIC
Bilirubin Urine: NEGATIVE
Glucose, UA: NEGATIVE mg/dL
Hgb urine dipstick: NEGATIVE
KETONES UR: NEGATIVE mg/dL
Nitrite: NEGATIVE
Protein, ur: NEGATIVE mg/dL
Specific Gravity, Urine: 1.025 (ref 1.005–1.030)
UROBILINOGEN UA: 0.2 mg/dL (ref 0.0–1.0)
pH: 6 (ref 5.0–8.0)

## 2015-04-28 LAB — CBC
HCT: 31 % — ABNORMAL LOW (ref 36.0–46.0)
HEMOGLOBIN: 10.6 g/dL — AB (ref 12.0–15.0)
MCH: 29.9 pg (ref 26.0–34.0)
MCHC: 34.2 g/dL (ref 30.0–36.0)
MCV: 87.3 fL (ref 78.0–100.0)
Platelets: 222 10*3/uL (ref 150–400)
RBC: 3.55 MIL/uL — AB (ref 3.87–5.11)
RDW: 13.2 % (ref 11.5–15.5)
WBC: 9.7 10*3/uL (ref 4.0–10.5)

## 2015-04-28 LAB — COMPREHENSIVE METABOLIC PANEL
ALT: 18 U/L (ref 14–54)
AST: 18 U/L (ref 15–41)
Albumin: 3.7 g/dL (ref 3.5–5.0)
Alkaline Phosphatase: 43 U/L (ref 38–126)
Anion gap: 3 — ABNORMAL LOW (ref 5–15)
BUN: 5 mg/dL — ABNORMAL LOW (ref 6–20)
CHLORIDE: 109 mmol/L (ref 101–111)
CO2: 22 mmol/L (ref 22–32)
CREATININE: 0.44 mg/dL (ref 0.44–1.00)
Calcium: 8.7 mg/dL — ABNORMAL LOW (ref 8.9–10.3)
Glucose, Bld: 79 mg/dL (ref 65–99)
POTASSIUM: 3.6 mmol/L (ref 3.5–5.1)
Sodium: 134 mmol/L — ABNORMAL LOW (ref 135–145)
TOTAL PROTEIN: 6.5 g/dL (ref 6.5–8.1)
Total Bilirubin: 0.6 mg/dL (ref 0.3–1.2)

## 2015-04-28 LAB — WET PREP, GENITAL
TRICH WET PREP: NONE SEEN
Yeast Wet Prep HPF POC: NONE SEEN

## 2015-04-28 LAB — URINE MICROSCOPIC-ADD ON

## 2015-04-28 MED ORDER — PROMETHAZINE HCL 25 MG PO TABS: 12.5000 mg | ORAL_TABLET | Freq: Four times a day (QID) | ORAL | Status: DC | PRN

## 2015-04-28 MED ORDER — LACTATED RINGERS IV BOLUS (SEPSIS)
1000.0000 mL | Freq: Once | INTRAVENOUS | Status: AC
  Administered 2015-04-28: 1000 mL via INTRAVENOUS

## 2015-04-28 MED ORDER — PROMETHAZINE HCL 25 MG/ML IJ SOLN
25.0000 mg | Freq: Once | INTRAMUSCULAR | Status: AC
  Administered 2015-04-28: 25 mg via INTRAVENOUS
  Filled 2015-04-28: qty 1

## 2015-04-28 MED ORDER — METRONIDAZOLE 500 MG PO TABS
500.0000 mg | ORAL_TABLET | Freq: Two times a day (BID) | ORAL | Status: DC
Start: 2015-04-28 — End: 2015-07-10

## 2015-04-28 NOTE — Discharge Instructions (Signed)
Morning Sickness Morning sickness is when you feel sick to your stomach (nauseous) during pregnancy. This nauseous feeling may or may not come with vomiting. It often occurs in the morning but can be a problem any time of day. Morning sickness is most common during the first trimester, but it may continue throughout pregnancy. While morning sickness is unpleasant, it is usually harmless unless you develop severe and continual vomiting (hyperemesis gravidarum). This condition requires more intense treatment.  CAUSES  The cause of morning sickness is not completely known but seems to be related to normal hormonal changes that occur in pregnancy. RISK FACTORS You are at greater risk if you:  Experienced nausea or vomiting before your pregnancy.  Had morning sickness during a previous pregnancy.  Are pregnant with more than one baby, such as twins. TREATMENT  Do not use any medicines (prescription, over-the-counter, or herbal) for morning sickness without first talking to your health care provider. Your health care provider may prescribe or recommend:  Vitamin B6 supplements.  Anti-nausea medicines.  The herbal medicine ginger. HOME CARE INSTRUCTIONS   Only take over-the-counter or prescription medicines as directed by your health care provider.  Taking multivitamins before getting pregnant can prevent or decrease the severity of morning sickness in most women.  Eat a piece of dry toast or unsalted crackers before getting out of bed in the morning.  Eat five or six small meals a day.  Eat dry and bland foods (rice, baked potato). Foods high in carbohydrates are often helpful.  Do not drink liquids with your meals. Drink liquids between meals.  Avoid greasy, fatty, and spicy foods.  Get someone to cook for you if the smell of any food causes nausea and vomiting.  If you feel nauseous after taking prenatal vitamins, take the vitamins at night or with a snack.  Snack on protein  foods (nuts, yogurt, cheese) between meals if you are hungry.  Eat unsweetened gelatins for desserts.  Wearing an acupressure wristband (worn for sea sickness) may be helpful.  Acupuncture may be helpful.  Do not smoke.  Get a humidifier to keep the air in your house free of odors.  Get plenty of fresh air. SEEK MEDICAL CARE IF:   Your home remedies are not working, and you need medicine.  You feel dizzy or lightheaded.  You are losing weight. SEEK IMMEDIATE MEDICAL CARE IF:   You have persistent and uncontrolled nausea and vomiting.  You pass out (faint). MAKE SURE YOU:  Understand these instructions.  Will watch your condition.  Will get help right away if you are not doing well or get worse. Document Released: 11/11/2006 Document Revised: 09/25/2013 Document Reviewed: 03/07/2013 [facility] Patient Information 2015 [place], [place]. This information is not intended to replace advice given to you by your health care provider. Make sure you discuss any questions you have with your health care provider.  Vaginal Bleeding During Pregnancy, Second Trimester A small amount of bleeding (spotting) from the vagina is relatively common in pregnancy. It usually stops on its own. Various things can cause bleeding or spotting in pregnancy. Some bleeding may be related to the pregnancy, and some may not. Sometimes the bleeding is normal and is not a problem. However, bleeding can also be a sign of something serious. Be sure to tell your health care provider about any vaginal bleeding right away. Some possible causes of vaginal bleeding during the second trimester include:  Infection, inflammation, or growths on the cervix.   The placenta  may be partially or completely covering the opening of the cervix inside the uterus (placenta previa).  The placenta may have separated from the uterus (abruption of the placenta).   You may be having early (preterm) labor.   The cervix may not be  strong enough to keep a baby inside the uterus (cervical insufficiency).   Tiny cysts may have developed in the uterus instead of pregnancy tissue (molar pregnancy). HOME CARE INSTRUCTIONS  Watch your condition for any changes. The following actions may help to lessen any discomfort you are feeling:  Follow your health care provider's instructions for limiting your activity. If your health care provider orders bed rest, you may need to stay in bed and only get up to use the bathroom. However, your health care provider may allow you to continue light activity.  If needed, make plans for someone to help with your regular activities and responsibilities while you are on bed rest.  Keep track of the number of pads you use each day, how often you change pads, and how soaked (saturated) they are. Write this down.  Do not use tampons. Do not douche.  Do not have sexual intercourse or orgasms until approved by your health care provider.  If you pass any tissue from your vagina, save the tissue so you can show it to your health care provider.  Only take over-the-counter or prescription medicines as directed by your health care provider.  Do not take aspirin because it can make you bleed.  Do not exercise or perform any strenuous activities or heavy lifting without your health care provider's permission.  Keep all follow-up appointments as directed by your health care provider. SEEK MEDICAL CARE IF:  You have any vaginal bleeding during any part of your pregnancy.  You have cramps or labor pains.  You have a fever, not controlled by medicine. SEEK IMMEDIATE MEDICAL CARE IF:   You have severe cramps in your back or belly (abdomen).  You have contractions.  You have chills.  You pass large clots or tissue from your vagina.  Your bleeding increases.  You feel light-headed or weak, or you have fainting episodes.  You are leaking fluid or have a gush of fluid from your vagina. MAKE  SURE YOU:  Understand these instructions.  Will watch your condition.  Will get help right away if you are not doing well or get worse. Document Released: 06/30/2005 Document Revised: 09/25/2013 Document Reviewed: 05/28/2013 [facility] Patient Information 2015 [place], [place]. This information is not intended to replace advice given to you by your health care provider. Make sure you discuss any questions you have with your health care provider.  Bacterial Vaginosis Bacterial vaginosis is a vaginal infection that occurs when the normal balance of bacteria in the vagina is disrupted. It results from an overgrowth of certain bacteria. This is the most common vaginal infection in women of childbearing age. Treatment is important to prevent complications, especially in pregnant women, as it can cause a premature delivery. CAUSES  Bacterial vaginosis is caused by an increase in harmful bacteria that are normally present in smaller amounts in the vagina. Several different kinds of bacteria can cause bacterial vaginosis. However, the reason that the condition develops is not fully understood. RISK FACTORS Certain activities or behaviors can put you at an increased risk of developing bacterial vaginosis, including:  Having a new sex partner or multiple sex partners.  Douching.  Using an intrauterine device (IUD) for contraception. Women do not get bacterial  vaginosis from toilet seats, bedding, swimming pools, or contact with objects around them. SIGNS AND SYMPTOMS  Some women with bacterial vaginosis have no signs or symptoms. Common symptoms include:  Grey vaginal discharge.  A fishlike odor with discharge, especially after sexual intercourse.  Itching or burning of the vagina and vulva.  Burning or pain with urination. DIAGNOSIS  Your health care provider will take a medical history and examine the vagina for signs of bacterial vaginosis. A sample of vaginal fluid may be taken. Your health  care provider will look at this sample under a microscope to check for bacteria and abnormal cells. A vaginal pH test may also be done.  TREATMENT  Bacterial vaginosis may be treated with antibiotic medicines. These may be given in the form of a pill or a vaginal cream. A second round of antibiotics may be prescribed if the condition comes back after treatment.  HOME CARE INSTRUCTIONS   Only take over-the-counter or prescription medicines as directed by your health care provider.  If antibiotic medicine was prescribed, take it as directed. Make sure you finish it even if you start to feel better.  Do not have sex until treatment is completed.  Tell all sexual partners that you have a vaginal infection. They should see their health care provider and be treated if they have problems, such as a mild rash or itching.  Practice safe sex by using condoms and only having one sex partner. SEEK MEDICAL CARE IF:   Your symptoms are not improving after 3 days of treatment.  You have increased discharge or pain.  You have a fever. MAKE SURE YOU:   Understand these instructions.  Will watch your condition.  Will get help right away if you are not doing well or get worse. FOR MORE INFORMATION  Centers for Disease Control and Prevention, Division of STD Prevention: [employer] American Sexual Health Association (ASHA): www.ashastd.org  Document Released: 09/20/2005 Document Revised: 07/11/2013 Document Reviewed: 05/02/2013 [facility] Patient Information 2015 [place], [place]. This information is not intended to replace advice given to you by your health care provider. Make sure you discuss any questions you have with your health care provider.

## 2015-04-28 NOTE — MAU Note (Addendum)
Patient presents at [redacted] weeks gestation with c/o vomiting X 2 weeks and vaginal bleeding yesterday. Denies discharge. Unable to void on admission. Informed a urine specimen is needed for testing.

## 2015-04-28 NOTE — MAU Provider Note (Signed)
Chief Complaint: Vaginal Bleeding and Emesis   First Provider Initiated Contact with Patient 04/28/15 1546      SUBJECTIVE HPI: Kristin Powers is a 24 y.o. [id number] at [redacted]w[redacted]d by LMP who presents to maternity admissions reporting vaginal spotting off and on since yesterday and n/v of pregnancy for several weeks, worsening this week so she cannot keep down any food or fluids.  She reports vomiting 9-10x in 24 hours.  She denies abdominal pain, vaginal itching/burning, urinary symptoms, h/a, dizziness, or fever/chills.     Vaginal Bleeding The patient's primary symptoms include vaginal bleeding. This is a new problem. The current episode started yesterday. The problem occurs intermittently. The problem has been waxing and waning. The patient is experiencing no pain. She is pregnant. Associated symptoms include vomiting. Pertinent negatives include no abdominal pain, back pain, chills, constipation, diarrhea, dysuria, fever, frequency, headaches, nausea or urgency. The vaginal bleeding is spotting. She has not been passing clots. She has not been passing tissue. Nothing aggravates the symptoms. She has tried nothing for the symptoms. No, her partner does not have an STD.  Emesis  This is a recurrent problem. The current episode started 1 to 4 weeks ago. The problem occurs 5 to 10 times per day. The problem has been waxing and waning. The emesis has an appearance of stomach contents. There has been no fever. Pertinent negatives include no abdominal pain, chest pain, chills, coughing, diarrhea, dizziness, fever or headaches. She has tried nothing for the symptoms.    Past Medical History  Diagnosis Date  . Pregnancy induced hypertension   . Depression   . Headache(784.0)   . Asthma   . Urinary tract infection   . Abnormal Pap smear   . Human papilloma virus   . Trichomonas   . Acid reflux    Past Surgical History  Procedure Laterality Date  . Induced abortion  March 2013   History   Social  History  . Marital Status: Single    Spouse Name: N/A  . Number of Children: N/A  . Years of Education: N/A   Occupational History  . Not on file.   Social History Main Topics  . Smoking status: Current Some Day Smoker -- 0.50 packs/day for 2 years    Types: Cigarettes  . Smokeless tobacco: Never Used  . Alcohol Use: No  . Drug Use: No  . Sexual Activity: Yes    Birth Control/ Protection: None     Comment: plans depo at 6wk check up   Other Topics Concern  . Not on file   Social History Narrative   No current facility-administered medications on file prior to encounter.   No current outpatient prescriptions on file prior to encounter.   No Known Allergies  Review of Systems  Constitutional: Negative for fever, chills and malaise/fatigue.  Eyes: Negative for blurred vision.  Respiratory: Negative for cough and shortness of breath.   Cardiovascular: Negative for chest pain.  Gastrointestinal: Positive for vomiting. Negative for heartburn, nausea, abdominal pain, diarrhea and constipation.  Genitourinary: Positive for vaginal bleeding. Negative for dysuria, urgency and frequency.  Musculoskeletal: Negative.  Negative for back pain.  Neurological: Negative for dizziness and headaches.  Psychiatric/Behavioral: Negative for depression.    OBJECTIVE Blood pressure 129/78, pulse 86, temperature 99.4 F (37.4 C), temperature source Oral, resp. rate 16, height 5' 2.5" (1.588 m), weight 79.379 kg (175 lb), last menstrual period 01/12/2015. GENERAL: Well-developed, well-nourished female in no acute distress.  EYES: normal sclera/conjunctiva; no  lid-lag HENT: Atraumatic, normocephalic HEART: normal rate RESP: normal effort ABDOMEN: Soft, non-tender MUSCULOSKELETAL: Normal ROM EXTREMITIES: Nontender, no edema NEURO/PSYCH: Alert and oriented, appropriate affect  PELVIC EXAM: Cervix pink, visually closed, without lesion, moderate amount light brown discharge, vaginal walls and  external genitalia normal Bimanual exam: Cervix 0/long/high, firm, anterior, neg CMT  FHT 166 by doppler  LAB RESULTS Results for orders placed or performed during the hospital encounter of 04/28/15 (from the past 24 hour(s))  Urinalysis, Routine w reflex microscopic (not at [facility])     Status: Abnormal   Collection Time: 04/28/15  3:04 PM  Result Value Ref Range   Color, Urine YELLOW YELLOW   APPearance CLEAR CLEAR   Specific Gravity, Urine 1.025 1.005 - 1.030   pH 6.0 5.0 - 8.0   Glucose, UA NEGATIVE NEGATIVE mg/dL   Hgb urine dipstick NEGATIVE NEGATIVE   Bilirubin Urine NEGATIVE NEGATIVE   Ketones, ur NEGATIVE NEGATIVE mg/dL   Protein, ur NEGATIVE NEGATIVE mg/dL   Urobilinogen, UA 0.2 0.0 - 1.0 mg/dL   Nitrite NEGATIVE NEGATIVE   Leukocytes, UA TRACE (A) NEGATIVE  Urine microscopic-add on     Status: Abnormal   Collection Time: 04/28/15  3:04 PM  Result Value Ref Range   Squamous Epithelial / LPF MANY (A) RARE   WBC, UA 3-6 <3 WBC/hpf   RBC / HPF 3-6 <3 RBC/hpf   Bacteria, UA MANY (A) RARE   Urine-Other MUCOUS PRESENT   Wet prep, genital     Status: Abnormal   Collection Time: 04/28/15  3:52 PM  Result Value Ref Range   Yeast Wet Prep HPF POC NONE SEEN NONE SEEN   Trich, Wet Prep NONE SEEN NONE SEEN   Clue Cells Wet Prep HPF POC MODERATE (A) NONE SEEN   WBC, Wet Prep HPF POC MODERATE (A) NONE SEEN  CBC     Status: Abnormal   Collection Time: 04/28/15  5:00 PM  Result Value Ref Range   WBC 9.7 4.0 - 10.5 K/uL   RBC 3.55 (L) 3.87 - 5.11 MIL/uL   Hemoglobin 10.6 (L) 12.0 - 15.0 g/dL   HCT 16.1 (L) 09.6 - 04.5 %   MCV 87.3 78.0 - 100.0 fL   MCH 29.9 26.0 - 34.0 pg   MCHC 34.2 30.0 - 36.0 g/dL   RDW 40.9 81.1 - 91.4 %   Platelets 222 150 - 400 K/uL  Comprehensive metabolic panel     Status: Abnormal   Collection Time: 04/28/15  5:00 PM  Result Value Ref Range   Sodium 134 (L) 135 - 145 mmol/L   Potassium 3.6 3.5 - 5.1 mmol/L   Chloride 109 101 - 111 mmol/L   CO2 22  22 - 32 mmol/L   Glucose, Bld 79 65 - 99 mg/dL   BUN 5 (L) 6 - 20 mg/dL   Creatinine, Ser 7.82 0.44 - 1.00 mg/dL   Calcium 8.7 (L) 8.9 - 10.3 mg/dL   Total Protein 6.5 6.5 - 8.1 g/dL   Albumin 3.7 3.5 - 5.0 g/dL   AST 18 15 - 41 U/L   ALT 18 14 - 54 U/L   Alkaline Phosphatase 43 38 - 126 U/L   Total Bilirubin 0.6 0.3 - 1.2 mg/dL   GFR calc non Af Amer >60 >60 mL/min   GFR calc Af Amer >60 >60 mL/min   Anion gap 3 (L) 5 - 15    O NEG  IMAGING  Preliminary results with normal FHR, amniotic fluid, and  placenta  MAU Management CBC, CMP, U/A, wet prep ordered and results reviewed.  GCC pending.  Limited OB U/S ordered related to report of bleeding.  Normal results reviewed.  LR x 1000 ml and Phenergan 25 mg IV given with complete resolution of nausea.  Pt to start [facility] as soon as possible, is working on her [employer].  Pt stable at time of discharge.  ASSESSMENT 1. Nausea and vomiting during pregnancy prior to [redacted] weeks gestation   2. Vaginal bleeding in pregnancy, second trimester   3. BV (bacterial vaginosis)     PLAN Discharge home Flagyl 500 mg BID x 7 days Phenergan 25 mg PO Q 6 hours PRN Pt given contact information for [facility] to begin prenatal care   Medication List    TAKE these medications        metroNIDAZOLE 500 MG tablet  Commonly known as:  FLAGYL  Take 1 tablet (500 mg total) by mouth 2 (two) times daily.     promethazine 25 MG tablet  Commonly known as:  PHENERGAN  Take 0.5-1 tablets (12.5-25 mg total) by mouth every 6 (six) hours as needed.       Follow-up Information    Follow up with [facility] [place].   Why:  Start prenatal care as soon as possible   Contact information:   [address] [place] [address] [phone]      Follow up with [facility] [place] [facility].   Why:  As needed for emergencies   Contact information:   [address] [id number] [address] [email or web address]  [place] [address] [phone]      Sharen Counter Certified Nurse-Midwife 04/28/2015  7:16 PM   Addendum:  Pt left MAU without Rhogam injection for Rh negative with report of bleeding.  Called pt to notify her and pt to return to MAU 04/29/15 for injection.

## 2015-04-29 LAB — GC/CHLAMYDIA PROBE AMP (~~LOC~~) NOT AT ARMC
Chlamydia: NEGATIVE
Neisseria Gonorrhea: NEGATIVE

## 2015-04-29 LAB — HIV ANTIBODY (ROUTINE TESTING W REFLEX): HIV Screen 4th Generation wRfx: NONREACTIVE

## 2015-06-16 ENCOUNTER — Other Ambulatory Visit (HOSPITAL_COMMUNITY): Payer: Self-pay | Admitting: Nurse Practitioner

## 2015-06-16 DIAGNOSIS — Z3689 Encounter for other specified antenatal screening: Secondary | ICD-10-CM

## 2015-06-16 DIAGNOSIS — Z3A23 23 weeks gestation of pregnancy: Secondary | ICD-10-CM

## 2015-06-16 LAB — OB RESULTS CONSOLE HGB/HCT, BLOOD
HEMATOCRIT: 33 %
Hemoglobin: 10.5 g/dL

## 2015-06-16 LAB — OB RESULTS CONSOLE PLATELET COUNT: Platelets: 225 10*3/uL

## 2015-06-16 LAB — OB RESULTS CONSOLE VARICELLA ZOSTER ANTIBODY, IGG: Varicella: IMMUNE

## 2015-06-16 LAB — CULTURE, OB URINE: URINE CULTURE, OB: NEGATIVE

## 2015-06-16 LAB — OB RESULTS CONSOLE GC/CHLAMYDIA
Chlamydia: NEGATIVE
Gonorrhea: NEGATIVE

## 2015-06-16 LAB — OB RESULTS CONSOLE ABO/RH: RH TYPE: NEGATIVE

## 2015-06-16 LAB — OB RESULTS CONSOLE HEPATITIS B SURFACE ANTIGEN: HEP B S AG: NEGATIVE

## 2015-06-16 LAB — OB RESULTS CONSOLE HIV ANTIBODY (ROUTINE TESTING): HIV: NONREACTIVE

## 2015-06-16 LAB — OB RESULTS CONSOLE RUBELLA ANTIBODY, IGM: RUBELLA: IMMUNE

## 2015-06-16 LAB — CYTOLOGY - PAP: CYTOLOGY - PAP: NEGATIVE

## 2015-06-16 LAB — OB RESULTS CONSOLE RPR: RPR: NONREACTIVE

## 2015-06-16 LAB — CYSTIC FIBROSIS DIAGNOSTIC STUDY: Interpretation-CFDNA:: NEGATIVE

## 2015-06-16 LAB — DRUG SCREEN, URINE: Drug Screen, Urine: POSITIVE

## 2015-06-16 LAB — OB RESULTS CONSOLE ANTIBODY SCREEN: Antibody Screen: NEGATIVE

## 2015-06-24 ENCOUNTER — Ambulatory Visit (HOSPITAL_COMMUNITY)
Admission: RE | Admit: 2015-06-24 | Discharge: 2015-06-24 | Disposition: A | Discharge status: Home / Self Care | Payer: Medicaid Other | Source: Ambulatory Visit | Attending: Nurse Practitioner | Admitting: Nurse Practitioner

## 2015-06-24 DIAGNOSIS — Z3A23 23 weeks gestation of pregnancy: Secondary | ICD-10-CM

## 2015-06-24 DIAGNOSIS — Z36 Encounter for antenatal screening of mother: Secondary | ICD-10-CM | POA: Diagnosis present

## 2015-06-24 DIAGNOSIS — Z3689 Encounter for other specified antenatal screening: Secondary | ICD-10-CM

## 2015-06-26 ENCOUNTER — Encounter: Payer: Self-pay | Admitting: Obstetrics & Gynecology

## 2015-06-26 ENCOUNTER — Encounter: Payer: Self-pay | Admitting: Family Medicine

## 2015-06-27 ENCOUNTER — Encounter: Payer: Self-pay | Admitting: *Deleted

## 2015-06-27 DIAGNOSIS — O09219 Supervision of pregnancy with history of pre-term labor, unspecified trimester: Secondary | ICD-10-CM

## 2015-06-27 DIAGNOSIS — F329 Major depressive disorder, single episode, unspecified: Secondary | ICD-10-CM | POA: Insufficient documentation

## 2015-06-27 DIAGNOSIS — R825 Elevated urine levels of drugs, medicaments and biological substances: Secondary | ICD-10-CM

## 2015-06-27 DIAGNOSIS — F32A Depression, unspecified: Secondary | ICD-10-CM

## 2015-06-27 DIAGNOSIS — Z8759 Personal history of other complications of pregnancy, childbirth and the puerperium: Secondary | ICD-10-CM

## 2015-06-27 DIAGNOSIS — E669 Obesity, unspecified: Secondary | ICD-10-CM

## 2015-06-27 DIAGNOSIS — J45909 Unspecified asthma, uncomplicated: Secondary | ICD-10-CM | POA: Insufficient documentation

## 2015-06-27 DIAGNOSIS — O09899 Supervision of other high risk pregnancies, unspecified trimester: Secondary | ICD-10-CM

## 2015-06-27 DIAGNOSIS — Z6741 Type O blood, Rh negative: Secondary | ICD-10-CM

## 2015-07-10 ENCOUNTER — Ambulatory Visit (INDEPENDENT_AMBULATORY_CARE_PROVIDER_SITE_OTHER): Payer: Self-pay | Admitting: Family Medicine

## 2015-07-10 ENCOUNTER — Encounter: Payer: Self-pay | Admitting: Family

## 2015-07-10 VITALS — BP 134/78 | HR 93 | Temp 98.1°F | Wt 184.5 lb

## 2015-07-10 DIAGNOSIS — Z6741 Type O blood, Rh negative: Secondary | ICD-10-CM

## 2015-07-10 DIAGNOSIS — O09299 Supervision of pregnancy with other poor reproductive or obstetric history, unspecified trimester: Secondary | ICD-10-CM

## 2015-07-10 DIAGNOSIS — O09899 Supervision of other high risk pregnancies, unspecified trimester: Secondary | ICD-10-CM

## 2015-07-10 DIAGNOSIS — Z8742 Personal history of other diseases of the female genital tract: Secondary | ICD-10-CM

## 2015-07-10 DIAGNOSIS — Z8759 Personal history of other complications of pregnancy, childbirth and the puerperium: Secondary | ICD-10-CM

## 2015-07-10 DIAGNOSIS — O0990 Supervision of high risk pregnancy, unspecified, unspecified trimester: Secondary | ICD-10-CM | POA: Insufficient documentation

## 2015-07-10 DIAGNOSIS — O0992 Supervision of high risk pregnancy, unspecified, second trimester: Secondary | ICD-10-CM

## 2015-07-10 LAB — POCT URINALYSIS DIP (DEVICE)
BILIRUBIN URINE: NEGATIVE
GLUCOSE, UA: NEGATIVE mg/dL
Hgb urine dipstick: NEGATIVE
KETONES UR: NEGATIVE mg/dL
NITRITE: NEGATIVE
PH: 6.5 (ref 5.0–8.0)
PROTEIN: NEGATIVE mg/dL
Specific Gravity, Urine: 1.025 (ref 1.005–1.030)
Urobilinogen, UA: 0.2 mg/dL (ref 0.0–1.0)

## 2015-07-10 MED ORDER — ASPIRIN EC 81 MG PO TBEC: 81.0000 mg | DELAYED_RELEASE_TABLET | Freq: Every day | ORAL | Status: DC

## 2015-07-10 MED ORDER — CYCLOBENZAPRINE HCL 10 MG PO TABS: 10.0000 mg | ORAL_TABLET | Freq: Three times a day (TID) | ORAL | Status: DC | PRN

## 2015-07-10 NOTE — Progress Notes (Signed)
Subjective:  Kristin Powers is a 24 y.o. [phone] at [redacted]w[redacted]d being seen today for initial prenatal care - patient transferred from [facility].  Pregnancy history is significant for previous pregnancy with preeclampsia and IUGR.  Patient reports backache.  Contractions: Not present.  Vag. Bleeding: None. Movement: Present. Denies leaking of fluid.   The following portions of the patient's history were reviewed and updated as appropriate: allergies, current medications, past family history, past medical history, past social history, past surgical history and problem list.   Objective:   Filed Vitals:   07/10/15 0841  BP: 134/78  Pulse: 93  Temp: 98.1 F (36.7 C)  Weight: 184 lb 8 oz (83.689 kg)    Fetal Status: Fetal Heart Rate (bpm): 156   Movement: Present     General:  Alert, oriented and cooperative. Patient is in no acute distress.  Skin: Skin is warm and dry. No rash noted.   Cardiovascular: Normal heart rate noted  Respiratory: Normal respiratory effort, no problems with respiration noted  Abdomen: Soft, gravid, appropriate for gestational age. Pain/Pressure: Present     Pelvic: Vag. Bleeding: None     Cervical exam deferred        Extremities: Normal range of motion.  Edema: Trace  Mental Status: Normal mood and affect. Normal behavior. Normal judgment and thought content.   Urinalysis: Urine Protein: Negative Urine Glucose: Negative  Assessment and Plan:  Pregnancy: [id number] at [redacted]w[redacted]d  1. History of prior pregnancy with IUGR newborn [place] 9/21: 510g, 34% Will continue to monitory fundal height.    2. Supervision of high risk pregnancy, antepartum, second trimester FHT normal Will get flu shot and Tdap  3. Prior pregnancy complicated by PIH, antepartum, unspecified trimester ASA  24hr protein, metabolic panel  4. Type o blood, rh negative Rhogam at 28 week  Preterm labor symptoms and general obstetric precautions including but not limited to vaginal bleeding,  contractions, leaking of fluid and fetal movement were reviewed in detail with the patient. Please refer to After Visit Summary for other counseling recommendations.  Return in about 2 weeks (around 07/24/2015).   Levie Heritage, DO

## 2015-07-10 NOTE — Progress Notes (Signed)
Pt request refill on Flexeril, Pt did not take Flagyl Initial OB appointment, transfer from [facility] Declined flu vaccine Leukocytes: trace

## 2015-07-21 LAB — COMPREHENSIVE METABOLIC PANEL
ALBUMIN: 3.5 g/dL — AB (ref 3.6–5.1)
ALK PHOS: 63 U/L (ref 33–115)
ALT: 14 U/L (ref 6–29)
AST: 23 U/L (ref 10–30)
BUN: 4 mg/dL — ABNORMAL LOW (ref 7–25)
CALCIUM: 8.4 mg/dL — AB (ref 8.6–10.2)
CO2: 21 mmol/L (ref 20–31)
Chloride: 105 mmol/L (ref 98–110)
Creat: 0.45 mg/dL — ABNORMAL LOW (ref 0.50–1.10)
GLUCOSE: 79 mg/dL (ref 65–99)
POTASSIUM: 3.6 mmol/L (ref 3.5–5.3)
Sodium: 137 mmol/L (ref 135–146)
Total Bilirubin: 0.2 mg/dL (ref 0.2–1.2)
Total Protein: 6.1 g/dL (ref 6.1–8.1)

## 2015-07-21 LAB — CBC
HEMATOCRIT: 30.2 % — AB (ref 36.0–46.0)
HEMOGLOBIN: 10 g/dL — AB (ref 12.0–15.0)
MCH: 29.5 pg (ref 26.0–34.0)
MCHC: 33.1 g/dL (ref 30.0–36.0)
MCV: 89.1 fL (ref 78.0–100.0)
MPV: 9.9 fL (ref 8.6–12.4)
Platelets: 227 10*3/uL (ref 150–400)
RBC: 3.39 MIL/uL — ABNORMAL LOW (ref 3.87–5.11)
RDW: 13.5 % (ref 11.5–15.5)
WBC: 8.5 10*3/uL (ref 4.0–10.5)

## 2015-07-21 NOTE — Addendum Note (Signed)
Addended by: Aldona LentoFISHER, Shaleigh Laubscher L on: 07/21/2015 03:49 PM   Modules accepted: Orders

## 2015-07-24 ENCOUNTER — Encounter: Payer: Medicaid Other | Admitting: Family Medicine

## 2015-07-24 LAB — CREATININE CLEARANCE, URINE, 24 HOUR
CREATININE 24H UR: 1.17 g/(24.h) (ref 0.63–2.50)
CREATININE, URINE: 213 mg/dL (ref 20–320)
Creatinine Clearance: 181 mL/min — ABNORMAL HIGH (ref 75–115)
Creatinine: 0.45 mg/dL — ABNORMAL LOW (ref 0.50–1.10)

## 2015-07-24 LAB — PROTEIN, URINE, 24 HOUR
Protein, 24H Urine: 121 mg/24 h (ref <150)
Protein, Urine: 22 mg/dL (ref 5–24)

## 2015-07-28 ENCOUNTER — Encounter: Payer: Self-pay | Admitting: Family Medicine

## 2015-07-28 ENCOUNTER — Encounter: Payer: Medicaid Other | Admitting: Obstetrics and Gynecology

## 2015-07-28 ENCOUNTER — Encounter: Payer: Self-pay | Admitting: *Deleted

## 2015-08-11 ENCOUNTER — Encounter: Payer: Medicaid Other | Admitting: Obstetrics & Gynecology

## 2015-08-11 ENCOUNTER — Encounter: Payer: Self-pay | Admitting: Obstetrics & Gynecology

## 2015-08-18 ENCOUNTER — Encounter: Payer: Medicaid Other | Admitting: Obstetrics and Gynecology

## 2015-08-18 ENCOUNTER — Telehealth: Payer: Self-pay | Admitting: Obstetrics and Gynecology

## 2015-08-18 NOTE — Telephone Encounter (Signed)
Called patient about missed appointment. Left message for rescheduled appointment.

## 2015-08-25 ENCOUNTER — Encounter: Payer: Medicaid Other | Admitting: Obstetrics and Gynecology

## 2015-08-25 ENCOUNTER — Encounter: Payer: Self-pay | Admitting: Obstetrics and Gynecology

## 2015-09-08 ENCOUNTER — Encounter: Payer: Medicaid Other | Admitting: Family Medicine

## 2015-09-09 ENCOUNTER — Encounter (HOSPITAL_COMMUNITY): Payer: Self-pay | Admitting: *Deleted

## 2015-09-09 ENCOUNTER — Inpatient Hospital Stay (HOSPITAL_COMMUNITY)
Admission: AD | Admit: 2015-09-09 | Discharge: 2015-09-09 | Disposition: A | Discharge status: Home / Self Care | Payer: Medicaid Other | Source: Ambulatory Visit | Attending: Family Medicine | Admitting: Family Medicine

## 2015-09-09 DIAGNOSIS — Z87891 Personal history of nicotine dependence: Secondary | ICD-10-CM | POA: Insufficient documentation

## 2015-09-09 DIAGNOSIS — Z7982 Long term (current) use of aspirin: Secondary | ICD-10-CM | POA: Diagnosis not present

## 2015-09-09 DIAGNOSIS — O2343 Unspecified infection of urinary tract in pregnancy, third trimester: Secondary | ICD-10-CM

## 2015-09-09 DIAGNOSIS — O4703 False labor before 37 completed weeks of gestation, third trimester: Secondary | ICD-10-CM | POA: Diagnosis not present

## 2015-09-09 DIAGNOSIS — B9689 Other specified bacterial agents as the cause of diseases classified elsewhere: Secondary | ICD-10-CM | POA: Diagnosis not present

## 2015-09-09 DIAGNOSIS — O99613 Diseases of the digestive system complicating pregnancy, third trimester: Secondary | ICD-10-CM

## 2015-09-09 DIAGNOSIS — N760 Acute vaginitis: Secondary | ICD-10-CM | POA: Insufficient documentation

## 2015-09-09 DIAGNOSIS — O23593 Infection of other part of genital tract in pregnancy, third trimester: Secondary | ICD-10-CM | POA: Insufficient documentation

## 2015-09-09 DIAGNOSIS — R109 Unspecified abdominal pain: Secondary | ICD-10-CM | POA: Diagnosis present

## 2015-09-09 DIAGNOSIS — Z3A34 34 weeks gestation of pregnancy: Secondary | ICD-10-CM | POA: Diagnosis not present

## 2015-09-09 DIAGNOSIS — K219 Gastro-esophageal reflux disease without esophagitis: Secondary | ICD-10-CM | POA: Insufficient documentation

## 2015-09-09 DIAGNOSIS — R21 Rash and other nonspecific skin eruption: Secondary | ICD-10-CM | POA: Insufficient documentation

## 2015-09-09 LAB — URINALYSIS, ROUTINE W REFLEX MICROSCOPIC
Bilirubin Urine: NEGATIVE
GLUCOSE, UA: NEGATIVE mg/dL
HGB URINE DIPSTICK: NEGATIVE
KETONES UR: 15 mg/dL — AB
Nitrite: POSITIVE — AB
PH: 7 (ref 5.0–8.0)
Protein, ur: NEGATIVE mg/dL
Specific Gravity, Urine: 1.015 (ref 1.005–1.030)

## 2015-09-09 LAB — URINE MICROSCOPIC-ADD ON

## 2015-09-09 LAB — WET PREP, GENITAL
Sperm: NONE SEEN
Trich, Wet Prep: NONE SEEN
YEAST WET PREP: NONE SEEN

## 2015-09-09 MED ORDER — CEPHALEXIN 500 MG PO CAPS: 500.0000 mg | ORAL_CAPSULE | Freq: Four times a day (QID) | ORAL | Status: DC

## 2015-09-09 MED ORDER — HYDROCORTISONE 1 % EX OINT: 1.0000 "application " | TOPICAL_OINTMENT | Freq: Two times a day (BID) | CUTANEOUS | Status: DC

## 2015-09-09 MED ORDER — CEPHALEXIN 500 MG PO CAPS
500.0000 mg | ORAL_CAPSULE | Freq: Once | ORAL | Status: AC
  Administered 2015-09-09: 500 mg via ORAL
  Filled 2015-09-09: qty 1

## 2015-09-09 MED ORDER — METRONIDAZOLE 500 MG PO TABS: 500.0000 mg | ORAL_TABLET | Freq: Two times a day (BID) | ORAL | Status: DC

## 2015-09-09 MED ORDER — FAMOTIDINE 20 MG PO TABS
20.0000 mg | ORAL_TABLET | Freq: Once | ORAL | Status: AC
  Administered 2015-09-09: 20 mg via ORAL
  Filled 2015-09-09: qty 1

## 2015-09-09 MED ORDER — FAMOTIDINE 20 MG PO TABS: 20.0000 mg | ORAL_TABLET | Freq: Two times a day (BID) | ORAL | Status: DC

## 2015-09-09 NOTE — MAU Note (Signed)
Pt states her mucus plug fell out last night, having abd & back pain.  Feels like baby is kicking down in her pelvis.  Diarrhea started 2 days ago, 4 stools today.  Feels SOB.  Has mucus discharge, denies bleeding or LOF.

## 2015-09-09 NOTE — Progress Notes (Signed)
Patient keeps sitting straight up in the bed. EFM then traces MHR. Explained to patient importance of being able to assess her baby.

## 2015-09-09 NOTE — Progress Notes (Signed)
Patient states she has not soft/loose stools, but not watery stools.

## 2015-09-09 NOTE — Discharge Instructions (Signed)
Pregnancy and Urinary Tract Infection °A urinary tract infection (UTI) is a bacterial infection of the urinary tract. Infection of the urinary tract can include the ureters, kidneys (pyelonephritis), bladder (cystitis), and urethra (urethritis). All pregnant women should be screened for bacteria in the urinary tract. Identifying and treating a UTI will decrease the risk of preterm labor and developing more serious infections in both the mother and baby. °CAUSES °Bacteria germs cause almost all UTIs.  °RISK FACTORS °Many factors can increase your chances of getting a UTI during pregnancy. These include: °· Having a short urethra. °· Poor toilet and hygiene habits. °· Sexual intercourse. °· Blockage of urine along the urinary tract. °· Problems with the pelvic muscles or nerves. °· Diabetes. °· Obesity. °· Bladder problems after having several children. °· Previous history of UTI. °SIGNS AND SYMPTOMS  °· Pain, burning, or a stinging feeling when urinating. °· Suddenly feeling the need to urinate right away (urgency). °· Loss of bladder control (urinary incontinence). °· Frequent urination, more than is common with pregnancy. °· Lower abdominal or back discomfort. °· Cloudy urine. °· Blood in the urine (hematuria). °· Fever.  °When the kidneys are infected, the symptoms may be: °· Back pain. °· Flank pain on the right side more so than the left. °· Fever. °· Chills. °· Nausea. °· Vomiting. °DIAGNOSIS  °A urinary tract infection is usually diagnosed through urine tests. Additional tests and procedures are sometimes done. These may include: °· Ultrasound exam of the kidneys, ureters, bladder, and urethra. °· Looking in the bladder with a lighted tube (cystoscopy). °TREATMENT °Typically, UTIs can be treated with antibiotic medicines.  °HOME CARE INSTRUCTIONS  °· Only take over-the-counter or prescription medicines as directed by your health care provider. If you were prescribed antibiotics, take them as directed. Finish  them even if you start to feel better. °· Drink enough fluids to keep your urine clear or pale yellow. °· Do not have sexual intercourse until the infection is gone and your health care provider says it is okay. °· Make sure you are tested for UTIs throughout your pregnancy. These infections often come back.  °Preventing a UTI in the Future °· Practice good toilet habits. Always wipe from front to back. Use the tissue only once. °· Do not hold your urine. Empty your bladder as soon as possible when the urge comes. °· Do not douche or use deodorant sprays. °· Wash with soap and warm water around the genital area and the anus. °· Empty your bladder before and after sexual intercourse. °· Wear underwear with a cotton crotch. °· Avoid caffeine and carbonated drinks. They can irritate the bladder. °· Drink cranberry juice or take cranberry pills. This may decrease the risk of getting a UTI. °· Do not drink alcohol. °· Keep all your appointments and tests as scheduled.  °SEEK MEDICAL CARE IF:  °· Your symptoms get worse. °· You are still having fevers 2 or more days after treatment begins. °· You have a rash. °· You feel that you are having problems with medicines prescribed. °· You have abnormal vaginal discharge. °SEEK IMMEDIATE MEDICAL CARE IF:  °· You have back or flank pain. °· You have chills. °· You have blood in your urine. °· You have nausea and vomiting. °· You have contractions of your uterus. °· You have a gush of fluid from the vagina. °MAKE SURE YOU: °· Understand these instructions.   °· Will watch your condition.   °· Will get help right away if you are not doing   well or get worse.   °  °This information is not intended to replace advice given to you by your health care provider. Make sure you discuss any questions you have with your health care provider. °  °Document Released: 01/15/2011 Document Revised: 07/11/2013 Document Reviewed: 04/19/2013 °Elsevier Interactive Patient Education ©2016 Elsevier  Inc. °Preterm Labor Information °Preterm labor is when labor starts at less than 37 weeks of pregnancy. The normal length of a pregnancy is 39 to 41 weeks. °CAUSES °Often, there is no identifiable underlying cause as to why a woman goes into preterm labor. One of the most common known causes of preterm labor is infection. Infections of the uterus, cervix, vagina, amniotic sac, bladder, kidney, or even the lungs (pneumonia) can cause labor to start. Other suspected causes of preterm labor include:  °· Urogenital infections, such as yeast infections and bacterial vaginosis.   °· Uterine abnormalities (uterine shape, uterine septum, fibroids, or bleeding from the placenta).   °· A cervix that has been operated on (it may fail to stay closed).   °· Malformations in the fetus.   °· Multiple gestations (twins, triplets, and so on).   °· Breakage of the amniotic sac.   °RISK FACTORS °· Having a previous history of preterm labor.   °· Having premature rupture of membranes (PROM).   °· Having a placenta that covers the opening of the cervix (placenta previa).   °· Having a placenta that separates from the uterus (placental abruption).   °· Having a cervix that is too weak to hold the fetus in the uterus (incompetent cervix).   °· Having too much fluid in the amniotic sac (polyhydramnios).   °· Taking illegal drugs or smoking while pregnant.   °· Not gaining enough weight while pregnant.   °· Being younger than 18 and older than 23 years old.   °· Having a low socioeconomic status.   °· Being African American. °SYMPTOMS °Signs and symptoms of preterm labor include:  °· Menstrual-like cramps, abdominal pain, or back pain. °· Uterine contractions that are regular, as frequent as six in an hour, regardless of their intensity (may be mild or painful). °· Contractions that start on the top of the uterus and spread down to the lower abdomen and back.   °· A sense of increased pelvic pressure.   °· A watery or bloody mucus discharge  that comes from the vagina.   °TREATMENT °Depending on the length of the pregnancy and other circumstances, your health care provider may suggest bed rest. If necessary, there are medicines that can be given to stop contractions and to mature the fetal lungs. If labor happens before 34 weeks of pregnancy, a prolonged hospital stay may be recommended. Treatment depends on the condition of both you and the fetus.  °WHAT SHOULD YOU DO IF YOU THINK YOU ARE IN PRETERM LABOR? °Call your health care provider right away. You will need to go to the hospital to get checked immediately. °HOW CAN YOU PREVENT PRETERM LABOR IN FUTURE PREGNANCIES? °You should:  °· Stop smoking if you smoke.  °· Maintain healthy weight gain and avoid chemicals and drugs that are not necessary. °· Be watchful for any type of infection. °· Inform your health care provider if you have a known history of preterm labor. °  °This information is not intended to replace advice given to you by your health care provider. Make sure you discuss any questions you have with your health care provider. °  °Document Released: 12/11/2003 Document Revised: 05/23/2013 Document Reviewed: 10/23/2012 °Elsevier Interactive Patient Education ©2016 Elsevier Inc. ° °

## 2015-09-09 NOTE — MAU Provider Note (Signed)
Chief Complaint:  Abdominal Pain; Back Pain; and Diarrhea  First Provider Initiated Contact with Patient 09/09/15 1850      HPI: Kristin Powers is Powers 24 y.o. [id number] at [redacted]w[redacted]d who presents to maternity admissions reporting: 1. Losing her mucus plug yesterday 2. Frequent contractions (can't say how frequent) 3. Loose stools x 2 days, heartburn 4. Rash on neck.   Gets [facility] for history of IUGR in history of preeclampsia, but has missed last 6 appointments due to conflicts w/ work scheduled.    Location: low abd Quality: cramping Severity: 8/10 in pain scale Duration: 2 days Context: None Timing: intermittent Modifying factors: Hasn't tried anything to treat symptoms. Associated signs and symptoms: Positive for losing mucous plug, loose stools, low back pain. Negative for fever, chills, flank pain, dysuria, urgency, frequency, hematuria, vaginal bleeding, leaking of fluid.  Good fetal movement.   Past Medical History: Past Medical History  Diagnosis Date  . Pregnancy induced hypertension   . Depression   . Headache(784.0)   . Asthma   . Urinary tract infection   . Abnormal Pap smear   . Human papilloma virus   . Trichomonas   . Acid reflux     Past obstetric history: OB History  Gravida Para Term Preterm AB SAB TAB Ectopic Multiple Living  0 1 0 1 0 0 2    # Outcome Date GA Lbr Len/2nd Weight Sex Delivery Anes PTL Lv  4 Current           3 Term 04/07/11 [redacted]w[redacted]d  6 lb 2 oz (2.778 kg) F Vag-Spont   Y     Comments: induced measuring small  2 Term 04/05/10 [redacted]w[redacted]d  4 lb (1.814 kg) M Vag-Spont EPI Y Y     Comments: induced PIH at 36wks  1 TAB 10/04/01             Comments: System Generated. Please review and update pregnancy details.      Past Surgical History: Past Surgical History  Procedure Laterality Date  . Induced abortion  March 2013     Family History: Family History  Problem Relation Age of Onset  . Anesthesia problems Neg Hx   . Hypotension Neg  Hx   . Malignant hyperthermia Neg Hx   . Pseudochol deficiency Neg Hx   . Other Neg Hx   . Depression Mother   . Hypertension Mother   . Diabetes Mother   . Hypertension Father     Social History: Social History  Substance Use Topics  . Smoking status: Former Smoker -- 0.50 packs/day for 2 years    Types: Cigarettes  . Smokeless tobacco: Never Used  . Alcohol Use: No    Allergies: No Known Allergies  Meds:  Prescriptions prior to admission  Medication Sig Dispense Refill Last Dose  . aspirin EC 81 MG tablet Take 1 tablet (81 mg total) by mouth daily. 90 tablet 1   . cyclobenzaprine (FLEXERIL) 10 MG tablet Take 1 tablet (10 mg total) by mouth every 8 (eight) hours as needed for muscle spasms. 30 tablet 1   . Prenatal Vit-Fe Fumarate-FA (MULTIVITAMIN-PRENATAL) 27-0.8 MG TABS tablet Take 1 tablet by mouth daily at 12 noon.   Taking  . promethazine (PHENERGAN) 25 MG tablet Take 0.5-1 tablets (12.5-25 mg total) by mouth every 6 (six) hours as needed. 30 tablet 2 Taking    I have reviewed patient's Past Medical Hx, Surgical Hx, Family Hx, Social Hx, medications  and allergies.   ROS:  Review of Systems  Constitutional: Negative for fever and chills.  Gastrointestinal: Positive for abdominal pain and diarrhea (soft, not watery). Negative for nausea, vomiting, constipation and blood in stool.  Genitourinary: Positive for vaginal discharge. Negative for dysuria, urgency, frequency, hematuria, flank pain and vaginal bleeding.  Musculoskeletal: Positive for back pain.    Physical Exam  Patient Vitals for the past 24 hrs:  BP Temp Temp src Pulse Resp SpO2  09/09/15 1809 127/69 mmHg - - 106 - -  09/09/15 1739 111/65 mmHg 98.6 F (37 C) Oral 111 18 99 %   Constitutional: Well-developed, well-nourished female in no acute distress.  Psych: Flat affect. Cardiovascular: Mild tachycardia Respiratory: normal effort GI: Abd soft, non-tender, gravid appropriate for gestational age.  MS:  Extremities nontender, no edema, normal ROM Neurologic: Alert and oriented x 4.  GU: Neg CVAT.  Pelvic: NEFG, physiologic discharge, no blood, cervix clean. No CMT  Dilation: Closed Effacement (%): Thick Station: Ballotable Presentation: Undeterminable Exam by:: Ivonne AndrewV. Maurice Ramseur, CNM  FHT:  Baseline 140-150 , moderate variability, accelerations present, Possible variable deceleration vs tracing MHR. Adjusted.  Contractions: Rare, mild   Labs: Results for orders placed or performed during the hospital encounter of 09/09/15 (from the past 24 hour(s))  Urinalysis, Routine w reflex microscopic (not at [facility])     Status: Abnormal   Collection Time: 09/09/15  5:45 PM  Result Value Ref Range   Color, Urine YELLOW YELLOW   APPearance CLEAR CLEAR   Specific Gravity, Urine 1.015 1.005 - 1.030   pH 7.0 5.0 - 8.0   Glucose, UA NEGATIVE NEGATIVE mg/dL   Hgb urine dipstick NEGATIVE NEGATIVE   Bilirubin Urine NEGATIVE NEGATIVE   Ketones, ur 15 (Powers) NEGATIVE mg/dL   Protein, ur NEGATIVE NEGATIVE mg/dL   Nitrite POSITIVE (Powers) NEGATIVE   Leukocytes, UA SMALL (Powers) NEGATIVE  Urine microscopic-add on     Status: Abnormal   Collection Time: 09/09/15  5:45 PM  Result Value Ref Range   Squamous Epithelial / LPF 6-30 (Powers) NONE SEEN   WBC, UA 0-5 0 - 5 WBC/hpf   RBC / HPF 0-5 0 - 5 RBC/hpf   Bacteria, UA FEW (Powers) NONE SEEN   Urine-Other MUCOUS PRESENT   Results for Kristin HoppingGRAHAM, Manasvi Powers (MRN [id number]) as of 09/21/2015 10:03  Ref. Range 09/09/2015 18:45  Yeast Wet Prep HPF POC Latest Ref Range: NONE SEEN  NONE SEEN  Trich, Wet Prep Latest Ref Range: NONE SEEN  NONE SEEN  Clue Cells Wet Prep HPF POC Latest Ref Range: NONE SEEN  PRESENT (Powers)  WBC, Wet Prep HPF POC Latest Ref Range: NONE SEEN  MANY (Powers)    Imaging:  No results found.  MAU Course: UA, wet prep, GC/chlamydia cultures, speculum exam.  MDM: -24 year old female at 34.[redacted] weeks gestation with preterm contractions and loss of mucous plug, but no evidence of  active preterm labor -UTI may be contributing to contractions or patient may be mistaking bladder spasms with uterine contractions  Assessment: 1. Preterm uterine contractions, third trimester   2. UTI in pregnancy, third trimester   3. Gastroesophageal reflux in pregancy, third trimester   4. Rash of neck   5. BV (bacterial vaginosis)    Plan: Discharge home in stable condition.  Preterm labor precautions and fetal kick counts GC/Chlamydia cultures pending.  Follow-up Information    Schedule an appointment as soon as possible for [facility].   Specialty:  Obstetrics  and Gynecology   Contact information:   [address] [place] [address] [phone]      Follow up with [facility].   Why:  As needed in emergencies   Contact information:   [address] [id number] mc [place] [address] [phone]        Medication List    TAKE these medications        aspirin EC 81 MG tablet  Take 1 tablet (81 mg total) by mouth daily.     cephALEXin 500 MG capsule  Commonly known as:  KEFLEX  Take 1 capsule (500 mg total) by mouth 4 (four) times daily.     cyclobenzaprine 10 MG tablet  Commonly known as:  FLEXERIL  Take 1 tablet (10 mg total) by mouth every 8 (eight) hours as needed for muscle spasms.     famotidine 20 MG tablet  Commonly known as:  PEPCID  Take 1 tablet (20 mg total) by mouth 2 (two) times daily.     hydrocortisone 1 % ointment  Apply 1 application topically 2 (two) times daily.     metroNIDAZOLE 500 MG tablet  Commonly known as:  FLAGYL  Take 1 tablet (500 mg total) by mouth 2 (two) times daily.     multivitamin-prenatal 27-0.8 MG Tabs tablet  Take 1 tablet by mouth daily at 12 noon. Reported on 09/17/2015     promethazine 25 MG tablet  Commonly known as:  PHENERGAN  Take 0.5-1 tablets (12.5-25 mg total) by mouth every 6 (six) hours as  needed.        [place], [place] 09/09/2015 6:25 PM

## 2015-09-10 LAB — GC/CHLAMYDIA PROBE AMP (~~LOC~~) NOT AT ARMC
Chlamydia: NEGATIVE
Neisseria Gonorrhea: NEGATIVE

## 2015-09-11 LAB — CULTURE, OB URINE: Special Requests: NORMAL

## 2015-09-15 ENCOUNTER — Inpatient Hospital Stay (HOSPITAL_COMMUNITY)
Admission: AD | Admit: 2015-09-15 | Discharge: 2015-09-15 | Disposition: A | Discharge status: Home / Self Care | Payer: Medicaid Other | Source: Ambulatory Visit | Attending: Family Medicine | Admitting: Family Medicine

## 2015-09-15 ENCOUNTER — Encounter (HOSPITAL_COMMUNITY): Payer: Self-pay | Admitting: *Deleted

## 2015-09-15 DIAGNOSIS — R102 Pelvic and perineal pain: Secondary | ICD-10-CM | POA: Diagnosis not present

## 2015-09-15 DIAGNOSIS — M545 Low back pain: Secondary | ICD-10-CM | POA: Insufficient documentation

## 2015-09-15 DIAGNOSIS — Z3A35 35 weeks gestation of pregnancy: Secondary | ICD-10-CM | POA: Insufficient documentation

## 2015-09-15 DIAGNOSIS — Z87891 Personal history of nicotine dependence: Secondary | ICD-10-CM | POA: Diagnosis not present

## 2015-09-15 DIAGNOSIS — O479 False labor, unspecified: Secondary | ICD-10-CM

## 2015-09-15 DIAGNOSIS — O4703 False labor before 37 completed weeks of gestation, third trimester: Secondary | ICD-10-CM | POA: Insufficient documentation

## 2015-09-15 DIAGNOSIS — Z7982 Long term (current) use of aspirin: Secondary | ICD-10-CM | POA: Insufficient documentation

## 2015-09-15 HISTORY — DX: Unspecified abnormal cytological findings in specimens from vagina: R87.629

## 2015-09-15 LAB — URINALYSIS, ROUTINE W REFLEX MICROSCOPIC
Bilirubin Urine: NEGATIVE
GLUCOSE, UA: NEGATIVE mg/dL
HGB URINE DIPSTICK: NEGATIVE
Ketones, ur: 15 mg/dL — AB
Leukocytes, UA: NEGATIVE
NITRITE: NEGATIVE
PH: 7 (ref 5.0–8.0)
Protein, ur: NEGATIVE mg/dL
SPECIFIC GRAVITY, URINE: 1.02 (ref 1.005–1.030)

## 2015-09-15 LAB — WET PREP, GENITAL
CLUE CELLS WET PREP: NONE SEEN
Sperm: NONE SEEN
Trich, Wet Prep: NONE SEEN
Yeast Wet Prep HPF POC: NONE SEEN

## 2015-09-15 LAB — POCT FERN TEST: POCT Fern Test: NEGATIVE

## 2015-09-15 MED ORDER — OXYCODONE-ACETAMINOPHEN 10-325 MG PO TABS
1.0000 | ORAL_TABLET | ORAL | Status: DC | PRN
Start: 2015-09-15 — End: 2015-09-27

## 2015-09-15 MED ORDER — ACETAMINOPHEN 325 MG PO TABS: 650.0000 mg | ORAL_TABLET | Freq: Once | ORAL | Status: DC

## 2015-09-15 MED ORDER — OXYCODONE-ACETAMINOPHEN 5-325 MG PO TABS
2.0000 | ORAL_TABLET | Freq: Once | ORAL | Status: AC
  Administered 2015-09-15: 2 via ORAL
  Filled 2015-09-15: qty 2

## 2015-09-15 NOTE — MAU Note (Signed)
Pt states she urinated about 2000 and said after she finished someone told her she had fluid on the back of her pants.  Pt states she felt like it was still coming out somewhat on the way to the hospital.  Denies vaginal bleeding or abnormal discharge.  Good fetal movement.  Pt states she has also been having back pain for about 1 hour.

## 2015-09-15 NOTE — Discharge Instructions (Signed)
Braxton Hicks Contractions Contractions of the uterus can occur throughout pregnancy. Contractions are not always a sign that you are in labor.  WHAT ARE BRAXTON HICKS CONTRACTIONS?  Contractions that occur before labor are called Braxton Hicks contractions, or false labor. Toward the end of pregnancy (32-34 weeks), these contractions can develop more often and may become more forceful. This is not true labor because these contractions do not result in opening (dilatation) and thinning of the cervix. They are sometimes difficult to tell apart from true labor because these contractions can be forceful and people have different pain tolerances. You should not feel embarrassed if you go to the hospital with false labor. Sometimes, the only way to tell if you are in true labor is for your health care provider to look for changes in the cervix. If there are no prenatal problems or other health problems associated with the pregnancy, it is completely safe to be sent home with false labor and await the onset of true labor. HOW CAN YOU TELL THE DIFFERENCE BETWEEN TRUE AND FALSE LABOR? False Labor  The contractions of false labor are usually shorter and not as hard as those of true labor.   The contractions are usually irregular.   The contractions are often felt in the front of the lower abdomen and in the groin.   The contractions may go away when you walk around or change positions while lying down.   The contractions get weaker and are shorter lasting as time goes on.   The contractions do not usually become progressively stronger, regular, and closer together as with true labor.  True Labor  Contractions in true labor last 30-70 seconds, become very regular, usually become more intense, and increase in frequency.   The contractions do not go away with walking.   The discomfort is usually felt in the top of the uterus and spreads to the lower abdomen and low back.   True labor can be  determined by your health care provider with an exam. This will show that the cervix is dilating and getting thinner.  WHAT TO REMEMBER  Keep up with your usual exercises and follow other instructions given by your health care provider.   Take medicines as directed by your health care provider.   Keep your regular prenatal appointments.   Eat and drink lightly if you think you are going into labor.   If Braxton Hicks contractions are making you uncomfortable:   Change your position from lying down or resting to walking, or from walking to resting.   Sit and rest in a tub of warm water.   Drink 2-3 glasses of water. Dehydration may cause these contractions.   Do slow and deep breathing several times an hour.  WHEN SHOULD I SEEK IMMEDIATE MEDICAL CARE? Seek immediate medical care if:  Your contractions become stronger, more regular, and closer together.   You have fluid leaking or gushing from your vagina.   You have a fever.   You pass blood-tinged mucus.   You have vaginal bleeding.   You have continuous abdominal pain.   You have low back pain that you never had before.   You feel your baby's head pushing down and causing pelvic pressure.   Your baby is not moving as much as it used to.    This information is not intended to replace advice given to you by your health care provider. Make sure you discuss any questions you have with your health care  provider.   Document Released: 09/20/2005 Document Revised: 09/25/2013 Document Reviewed: 07/02/2013 Elsevier Interactive Patient Education 2016 Elsevier Inc.  Abdominal Pain During Pregnancy Belly (abdominal) pain is common during pregnancy. Most of the time, it is not a serious problem. Other times, it can be a sign that something is wrong with the pregnancy. Always tell your doctor if you have belly pain. HOME CARE Monitor your belly pain for any changes. The following actions may help you feel  better:  Do not have sex (intercourse) or put anything in your vagina until you feel better.  Rest until your pain stops.  Drink clear fluids if you feel sick to your stomach (nauseous). Do not eat solid food until you feel better.  Only take medicine as told by your doctor.  Keep all doctor visits as told. GET HELP RIGHT AWAY IF:   You are bleeding, leaking fluid, or pieces of tissue come out of your vagina.  You have more pain or cramping.  You keep throwing up (vomiting).  You have pain when you pee (urinate) or have blood in your pee.  You have a fever.  You do not feel your baby moving as much.  You feel very weak or feel like passing out.  You have trouble breathing, with or without belly pain.  You have a very bad headache and belly pain.  You have fluid leaking from your vagina and belly pain.  You keep having watery poop (diarrhea).  Your belly pain does not go away after resting, or the pain gets worse. MAKE SURE YOU:   Understand these instructions.  Will watch your condition.  Will get help right away if you are not doing well or get worse.   This information is not intended to replace advice given to you by your health care provider. Make sure you discuss any questions you have with your health care provider.   Document Released: 09/08/2009 Document Revised: 05/23/2013 Document Reviewed: 04/19/2013 Elsevier Interactive Patient Education [employer] Elsevier Inc.

## 2015-09-15 NOTE — MAU Provider Note (Signed)
History     CSN: [id number]  Arrival date and time: 09/15/15 2035   None     Chief Complaint  Patient presents with  . Rupture of Membranes  . Back Pain  . Leg Pain   HPI Kristin Powers is 24 y.o. [phone] @ [redacted]w[redacted]d presenting with lower back pain and vaginal pain.  Patient states she has a sharp back pain, that intermittent. Patient states that the pain radiates down her right leg. Patient states this pain started about 2 hours ago. Right leg with tingling as well. Patient states she urinated, and then noted that her pants were wet, even after she felt as if she was no longer urinating. Patient was then worried that she had ROM. Patient indicates vaginal discharge for about a day.  Patient denies leaking fluid currently. Endorses dysuria and nausea, no fevers, no chills, no vomiting.  Denies vaginal bleeding. Normal bowel movement this morning, loose stools about 3 hours ago. Patient's last cervical check on the 6th of December, closed at the time.   OB History    Gravida Para Term Preterm AB TAB SAB Ectopic Multiple Living   0 1 1 0 0 0 2      Past Medical History  Diagnosis Date  . Pregnancy induced hypertension   . Depression   . Headache(784.0)   . Asthma   . Urinary tract infection   . Abnormal Pap smear   . Human papilloma virus   . Trichomonas   . Acid reflux   . Vaginal Pap smear, abnormal     Past Surgical History  Procedure Laterality Date  . Induced abortion  March 2013    Family History  Problem Relation Age of Onset  . Anesthesia problems Neg Hx   . Hypotension Neg Hx   . Malignant hyperthermia Neg Hx   . Pseudochol deficiency Neg Hx   . Other Neg Hx   . Depression Mother   . Hypertension Mother   . Diabetes Mother   . Hypertension Father     Social History  Substance Use Topics  . Smoking status: Former Smoker -- 0.50 packs/day for 2 years    Types: Cigarettes  . Smokeless tobacco: Never Used  . Alcohol Use: No    Allergies: No  Known Allergies  Prescriptions prior to admission  Medication Sig Dispense Refill Last Dose  . aspirin EC 81 MG tablet Take 1 tablet (81 mg total) by mouth daily. 90 tablet 1   . cephALEXin (KEFLEX) 500 MG capsule Take 1 capsule (500 mg total) by mouth 4 (four) times daily. 28 capsule 0   . cyclobenzaprine (FLEXERIL) 10 MG tablet Take 1 tablet (10 mg total) by mouth every 8 (eight) hours as needed for muscle spasms. 30 tablet 1   . famotidine (PEPCID) 20 MG tablet Take 1 tablet (20 mg total) by mouth 2 (two) times daily. 60 tablet 3   . hydrocortisone 1 % ointment Apply 1 application topically 2 (two) times daily. 30 g 0   . metroNIDAZOLE (FLAGYL) 500 MG tablet Take 1 tablet (500 mg total) by mouth 2 (two) times daily. 14 tablet 0   . Prenatal Vit-Fe Fumarate-FA (MULTIVITAMIN-PRENATAL) 27-0.8 MG TABS tablet Take 1 tablet by mouth daily at 12 noon.   Taking  . promethazine (PHENERGAN) 25 MG tablet Take 0.5-1 tablets (12.5-25 mg total) by mouth every 6 (six) hours as needed. 30 tablet 2 Taking    Review of Systems  Constitutional: Negative  for fever.  Gastrointestinal: Positive for nausea. Negative for vomiting and diarrhea.  Genitourinary: Positive for dysuria.  Neurological: Negative for headaches.   Physical Exam   Blood pressure 135/79, pulse 112, temperature 98.1 F (36.7 C), temperature source Oral, resp. rate 20, last menstrual period 01/12/2015.  Physical Exam  Constitutional: She appears well-developed and well-nourished.  Cardiovascular: Normal rate, regular rhythm and normal heart sounds.   Respiratory: Effort normal and breath sounds normal.  GI: Soft. Bowel sounds are normal. She exhibits no distension. There is no tenderness. There is no CVA tenderness.  Skin: Skin is warm and dry.  FHT: 150 bpm, moderate variablity, accelerations present, no declerations,  Toco with rare irregular contractions, mild to palpation  Dilation: Fingertip Effacement (%): Thick Station:  -3 Exam by:: Dr. Cathlean CowerMikell, resident  MAU Course  Procedures  MDM Wet prep negative  UA negative  Fern negative    Assessment and Plan  Braxton-Hicks contractions. Patient is Kristin FatFern negative. With negative wet prep and UA. Likely patient is having  Braxton- Hicks contractions  - Will provide Pecrocet for pain and small prescription of percocet for labor rest - Follow up with OB provider   Kristin Powers 09/15/2015, 9:07 PM   I have seen this patient and agree with the above resident's note.  I have reviewed assessment, labs, and FHR tracing.   Pain likely sciatica with some intermittent contractions.  Will treat pain at this time, encourage pt to use ice/heat/rest/position change for pain, with Percocet 5/325, take 1-2 tabs Q 6 hours PRN x 6 tabs for short course to rest.  Labor precautions/reasons to return to MAU given.  Pt stable at time of discharge.   LEFTWICH-KIRBY, LISA Certified Nurse-Midwife

## 2015-09-17 ENCOUNTER — Encounter: Payer: Self-pay | Admitting: Obstetrics and Gynecology

## 2015-09-17 ENCOUNTER — Ambulatory Visit (INDEPENDENT_AMBULATORY_CARE_PROVIDER_SITE_OTHER): Payer: Self-pay | Admitting: Obstetrics and Gynecology

## 2015-09-17 VITALS — BP 133/78 | HR 90 | Temp 98.2°F | Wt 190.5 lb

## 2015-09-17 DIAGNOSIS — Z22338 Carrier of other streptococcus: Secondary | ICD-10-CM

## 2015-09-17 DIAGNOSIS — O9989 Other specified diseases and conditions complicating pregnancy, childbirth and the puerperium: Secondary | ICD-10-CM

## 2015-09-17 DIAGNOSIS — B373 Candidiasis of vulva and vagina: Secondary | ICD-10-CM

## 2015-09-17 DIAGNOSIS — Z113 Encounter for screening for infections with a predominantly sexual mode of transmission: Secondary | ICD-10-CM

## 2015-09-17 DIAGNOSIS — O0993 Supervision of high risk pregnancy, unspecified, third trimester: Secondary | ICD-10-CM

## 2015-09-17 DIAGNOSIS — R12 Heartburn: Secondary | ICD-10-CM

## 2015-09-17 DIAGNOSIS — Z8759 Personal history of other complications of pregnancy, childbirth and the puerperium: Secondary | ICD-10-CM

## 2015-09-17 DIAGNOSIS — Z8742 Personal history of other diseases of the female genital tract: Secondary | ICD-10-CM

## 2015-09-17 DIAGNOSIS — B3731 Acute candidiasis of vulva and vagina: Secondary | ICD-10-CM

## 2015-09-17 DIAGNOSIS — N949 Unspecified condition associated with female genital organs and menstrual cycle: Secondary | ICD-10-CM

## 2015-09-17 DIAGNOSIS — O26893 Other specified pregnancy related conditions, third trimester: Secondary | ICD-10-CM

## 2015-09-17 DIAGNOSIS — O98813 Other maternal infectious and parasitic diseases complicating pregnancy, third trimester: Secondary | ICD-10-CM

## 2015-09-17 LAB — OB RESULTS CONSOLE GBS: GBS: POSITIVE

## 2015-09-17 LAB — POCT URINALYSIS DIP (DEVICE)
Bilirubin Urine: NEGATIVE
GLUCOSE, UA: NEGATIVE mg/dL
HGB URINE DIPSTICK: NEGATIVE
Ketones, ur: NEGATIVE mg/dL
NITRITE: NEGATIVE
PROTEIN: 30 mg/dL — AB
Specific Gravity, Urine: 1.02 (ref 1.005–1.030)
UROBILINOGEN UA: 0.2 mg/dL (ref 0.0–1.0)
pH: 6.5 (ref 5.0–8.0)

## 2015-09-17 MED ORDER — OXYCODONE-ACETAMINOPHEN 5-325 MG PO TABS: 1.0000 | ORAL_TABLET | ORAL | Status: DC | PRN

## 2015-09-17 MED ORDER — OMEPRAZOLE 40 MG PO CPDR: 40.0000 mg | DELAYED_RELEASE_CAPSULE | Freq: Every day | ORAL | Status: DC

## 2015-09-17 MED ORDER — TERCONAZOLE 0.4 % VA CREA: 1.0000 | TOPICAL_CREAM | Freq: Every day | VAGINAL | Status: DC

## 2015-09-17 NOTE — Progress Notes (Signed)
SUBJECTIVE: Kristin Powers is a 24 y.o. [phone] at 7552w3d here for routine follow up prenatal visit. Past Ob hx significant for preE and IUGR. [facility] Still with lower abdominal and back pain, irregular contractions. Heartburn worse and pills ineffective. Has vaginal itching like yeast she's had in past. White thick discharge. Denies regular UCs, VB, LOF. Good FM. Seen MAU 2 days ago for abd pain with reactive NST and normal cx exam. Rx'd for Percocet 6 tabs which helped and did not interfere with work.   OBJECTIVE: Filed Vitals:   09/17/15 1613  BP: 133/78  Pulse: 90  Temp: 98.2 F (36.8 C)   Fetal status: well by FHR 147, reported good fetal activity Gen: NAD Abd: S=D, NT Pelvic: cx th/closed/high   GC/CT and GBS sent Ext: trace edema  ASSESSMENT: 24 yo G4P2012 at 752w3d RLP Heartburn Yeast vaginitis  PLAN: PTL sx, FM awareness, heartburn and RLP relief measures discussed.  RX: Prilosec 40mg  qd, Terazol vag cream, Percocet #6 refilled

## 2015-09-17 NOTE — Patient Instructions (Addendum)
Third Trimester of Pregnancy The third trimester is from week 29 through week 42, months 7 through 9. The third trimester is a time when the fetus is growing rapidly. At the end of the ninth month, the fetus is about 20 inches in length and weighs 6-10 pounds.  BODY CHANGES Your body goes through many changes during pregnancy. The changes vary from woman to woman.   Your weight will continue to increase. You can expect to gain 25-35 pounds (11-16 kg) by the end of the pregnancy.  You may begin to get stretch marks on your hips, abdomen, and breasts.  You may urinate more often because the fetus is moving lower into your pelvis and pressing on your bladder.  You may develop or continue to have heartburn as a result of your pregnancy.  You may develop constipation because certain hormones are causing the muscles that push waste through your intestines to slow down.  You may develop hemorrhoids or swollen, bulging veins (varicose veins).  You may have pelvic pain because of the weight gain and pregnancy hormones relaxing your joints between the bones in your pelvis. Backaches may result from overexertion of the muscles supporting your posture.  You may have changes in your hair. These can include thickening of your hair, rapid growth, and changes in texture. Some women also have hair loss during or after pregnancy, or hair that feels dry or thin. Your hair will most likely return to normal after your baby is born.  Your breasts will continue to grow and be tender. A yellow discharge may leak from your breasts called colostrum.  Your belly button may stick out.  You may feel short of breath because of your expanding uterus.  You may notice the fetus "dropping," or moving lower in your abdomen.  You may have a bloody mucus discharge. This usually occurs a few days to a week before labor begins.  Your cervix becomes thin and soft (effaced) near your due date. WHAT TO EXPECT AT YOUR PRENATAL  EXAMS  You will have prenatal exams every 2 weeks until week 36. Then, you will have weekly prenatal exams. During a routine prenatal visit:  You will be weighed to make sure you and the fetus are growing normally.  Your blood pressure is taken.  Your abdomen will be measured to track your baby's growth.  The fetal heartbeat will be listened to.  Any test results from the previous visit will be discussed.  You may have a cervical check near your due date to see if you have effaced. At around 36 weeks, your caregiver will check your cervix. At the same time, your caregiver will also perform a test on the secretions of the vaginal tissue. This test is to determine if a type of bacteria, Group B streptococcus, is present. Your caregiver will explain this further. Your caregiver may ask you:  What your birth plan is.  How you are feeling.  If you are feeling the baby move.  If you have had any abnormal symptoms, such as leaking fluid, bleeding, severe headaches, or abdominal cramping.  If you are using any tobacco products, including cigarettes, chewing tobacco, and electronic cigarettes.  If you have any questions. Other tests or screenings that may be performed during your third trimester include:  Blood tests that check for low iron levels (anemia).  Fetal testing to check the health, activity level, and growth of the fetus. Testing is done if you have certain medical conditions or if   there are problems during the pregnancy.  HIV (human immunodeficiency virus) testing. If you are at high risk, you may be screened for HIV during your third trimester of pregnancy. FALSE LABOR You may feel small, irregular contractions that eventually go away. These are called Braxton Hicks contractions, or false labor. Contractions may last for hours, days, or even weeks before true labor sets in. If contractions come at regular intervals, intensify, or become painful, it is best to be seen by your  caregiver.  SIGNS OF LABOR   Menstrual-like cramps.  Contractions that are 5 minutes apart or less.  Contractions that start on the top of the uterus and spread down to the lower abdomen and back.  A sense of increased pelvic pressure or back pain.  A watery or bloody mucus discharge that comes from the vagina. If you have any of these signs before the 37th week of pregnancy, call your caregiver right away. You need to go to the hospital to get checked immediately. HOME CARE INSTRUCTIONS   Avoid all smoking, herbs, alcohol, and unprescribed drugs. These chemicals affect the formation and growth of the baby.  Do not use any tobacco products, including cigarettes, chewing tobacco, and electronic cigarettes. If you need help quitting, ask your health care provider. You may receive counseling support and other resources to help you quit.  Follow your caregiver's instructions regarding medicine use. There are medicines that are either safe or unsafe to take during pregnancy.  Exercise only as directed by your caregiver. Experiencing uterine cramps is a good sign to stop exercising.  Continue to eat regular, healthy meals.  Wear a good support bra for breast tenderness.  Do not use hot tubs, steam rooms, or saunas.  Wear your seat belt at all times when driving.  Avoid raw meat, uncooked cheese, cat litter boxes, and soil used by cats. These carry germs that can cause birth defects in the baby.  Take your prenatal vitamins.  Take 1500-2000 mg of calcium daily starting at the 20th week of pregnancy until you deliver your baby.  Try taking a stool softener (if your caregiver approves) if you develop constipation. Eat more high-fiber foods, such as fresh vegetables or fruit and whole grains. Drink plenty of fluids to keep your urine clear or pale yellow.  Take warm sitz baths to soothe any pain or discomfort caused by hemorrhoids. Use hemorrhoid cream if your caregiver approves.  If  you develop varicose veins, wear support hose. Elevate your feet for 15 minutes, 3-4 times a day. Limit salt in your diet.  Avoid heavy lifting, wear low heal shoes, and practice good posture.  Rest a lot with your legs elevated if you have leg cramps or low back pain.  Visit your dentist if you have not gone during your pregnancy. Use a soft toothbrush to brush your teeth and be gentle when you floss.  A sexual relationship may be continued unless your caregiver directs you otherwise.  Do not travel far distances unless it is absolutely necessary and only with the approval of your caregiver.  Take prenatal classes to understand, practice, and ask questions about the labor and delivery.  Make a trial run to the hospital.  Pack your hospital bag.  Prepare the baby's nursery.  Continue to go to all your prenatal visits as directed by your caregiver. SEEK MEDICAL CARE IF:  You are unsure if you are in labor or if your water has broken.  You have dizziness.  You have  mild pelvic cramps, pelvic pressure, or nagging pain in your abdominal area.  You have persistent nausea, vomiting, or diarrhea.  You have a bad smelling vaginal discharge.  You have pain with urination. SEEK IMMEDIATE MEDICAL CARE IF:   You have a fever.  You are leaking fluid from your vagina.  You have spotting or bleeding from your vagina.  You have severe abdominal cramping or pain.  You have rapid weight loss or gain.  You have shortness of breath with chest pain.  You notice sudden or extreme swelling of your face, hands, ankles, feet, or legs.  You have not felt your baby move in over an hour.  You have severe headaches that do not go away with medicine.  You have vision changes.   This information is not intended to replace advice given to you by your health care provider. Make sure you discuss any questions you have with your health care provider.   Document Released: 09/14/2001 Document  Revised: 10/11/2014 Document Reviewed: 11/21/2012 Elsevier Interactive Patient Education 2016 [employer] Heartburn Heartburn is a type of pain or discomfort that can happen in the throat or chest. It is often described as a burning pain. It may also cause a bad taste in the mouth. Heartburn may feel worse when you lie down or bend over, and it is often worse at night. Heartburn may be caused by stomach contents that move back up into the esophagus (reflux). HOME CARE INSTRUCTIONS Take these actions to decrease your discomfort and to help avoid complications. Diet  Follow a diet as recommended by your health care provider. This may involve avoiding foods and drinks such as:  Coffee and tea (with or without caffeine).  Drinks that contain alcohol.  Energy drinks and sports drinks.  Carbonated drinks or sodas.  Chocolate and cocoa.  Peppermint and mint flavorings.  Garlic and onions.  Horseradish.  Spicy and acidic foods, including peppers, chili powder, curry powder, vinegar, hot sauces, and barbecue sauce.  Citrus fruit juices and citrus fruits, such as oranges, lemons, and limes.  Tomato-based foods, such as red sauce, chili, salsa, and pizza with red sauce.  Fried and fatty foods, such as donuts, french fries, potato chips, and high-fat dressings.  High-fat meats, such as hot dogs and fatty cuts of red and white meats, such as rib eye steak, sausage, ham, and bacon.  High-fat dairy items, such as whole milk, butter, and cream cheese.  Eat small, frequent meals instead of large meals.  Avoid drinking large amounts of liquid with your meals.  Avoid eating meals during the 2-3 hours before bedtime.  Avoid lying down right after you eat.  Do not exercise right after you eat. General Instructions  Pay attention to any changes in your symptoms.  Take over-the-counter and prescription medicines only as told by your health care provider. Do not take aspirin,  ibuprofen, or other NSAIDs unless your health care provider told you to do so.  Do not use any tobacco products, including cigarettes, chewing tobacco, and e-cigarettes. If you need help quitting, ask your health care provider.  Wear loose-fitting clothing. Do not wear anything tight around your waist that causes pressure on your abdomen.  Raise (elevate) the head of your bed about 6 inches (15 cm).  Try to reduce your stress, such as with yoga or meditation. If you need help reducing stress, ask your health care provider.  If you are overweight, reduce your weight to an amount that is healthy for  you. Ask your health care provider for guidance about a safe weight loss goal.  Keep all follow-up visits as told by your health care provider. This is important. SEEK MEDICAL CARE IF:  You have new symptoms.  You have unexplained weight loss.  You have difficulty swallowing, or it hurts to swallow.  You have wheezing or a persistent cough.  Your symptoms do not improve with treatment.  You have frequent heartburn for more than two weeks. SEEK IMMEDIATE MEDICAL CARE IF:  You have pain in your arms, neck, jaw, teeth, or back.  You feel sweaty, dizzy, or light-headed.  You have chest pain or shortness of breath.  You vomit and your vomit looks like blood or coffee grounds.  Your stool is bloody or black.   Round Ligament Pain During Pregnancy   Round ligament pain is a sharp pain or jabbing feeling often felt in the lower belly or groin area on one or both sides. It is one of the most common complaints during pregnancy and is considered a normal part of pregnancy. It is most often felt during the second trimester.   Here is what you need to know about round ligament pain, including some tips to help you feel better.   Causes of Round Ligament Pain   Several thick ligaments surround and support your womb (uterus) as it grows during pregnancy. One of them is called the round  ligament.   The round ligament connects the front part of the womb to your groin, the area where your legs attach to your pelvis. The round ligament normally tightens and relaxes slowly.   As your baby and womb grow, the round ligament stretches. That makes it more likely to become strained.   Sudden movements can cause the ligament to tighten quickly, like a rubber band snapping. This causes a sudden and quick jabbing feeling.   Symptoms of Round Ligament Pain   Round ligament pain can be concerning and uncomfortable. But it is considered normal as your body changes during pregnancy.   The symptoms of round ligament pain include a sharp, sudden spasm in the belly. It usually affects the right side, but it may happen on both sides. The pain only lasts a few seconds.   Exercise may cause the pain, as will rapid movements such as:  sneezing  coughing  laughing  rolling over in bed  standing up too quickly   Treatment of Round Ligament Pain   Here are some tips that may help reduce your discomfort:   Pain relief. Take over-the-counter acetaminophen for pain, if necessary. Ask your doctor if this is OK.   Exercise. Get plenty of exercise to keep your stomach (core) muscles strong. Doing stretching exercises or prenatal yoga can be helpful. Ask your doctor which exercises are safe for you and your baby.   A helpful exercise involves putting your hands and knees on the floor, lowering your head, and pushing your backside into the air.   Avoid sudden movements. Change positions slowly (such as standing up or sitting down) to avoid sudden movements that may cause stretching and pain.   Flex your hips. Bend and flex your hips before you cough, sneeze, or laugh to avoid pulling on the ligaments.   Apply warmth. A heating pad or warm bath may be helpful. Ask your doctor if this is OK. Extreme heat can be dangerous to the baby.   You should try to modify your daily activity level and avoid  positions  that may worsen the condition.   When to Call the Doctor/Midwife   Always tell your doctor or midwife about any type of pain you have during pregnancy. Round ligament pain is quick and doesn't last long.   Call your health care provider immediately if you have:  severe pain  fever  chills  pain on urination  difficulty walking   Belly pain during pregnancy can be due to many different causes. It is important for your doctor to rule out more serious conditions, including pregnancy complications such as placenta abruption or non-pregnancy illnesses such as:  inguinal hernia  appendicitis  stomach, liver, and kidney problems  Preterm labor pains may sometimes be mistaken for round ligament pain.    This information is not intended to replace advice given to you by your health care provider. Make sure you discuss any questions you have with your health care provider.   Document Released: 02/06/2009 Document Revised: 06/11/2015 Document Reviewed: 01/15/2015 Elsevier Interactive Patient Education [employer]

## 2015-09-18 LAB — GC/CHLAMYDIA PROBE AMP (~~LOC~~) NOT AT ARMC
CHLAMYDIA, DNA PROBE: NEGATIVE
NEISSERIA GONORRHEA: NEGATIVE

## 2015-09-20 LAB — CULTURE, BETA STREP (GROUP B ONLY)

## 2015-09-24 DIAGNOSIS — O99820 Streptococcus B carrier state complicating pregnancy: Secondary | ICD-10-CM | POA: Insufficient documentation

## 2015-09-27 ENCOUNTER — Encounter (HOSPITAL_COMMUNITY): Payer: Self-pay

## 2015-09-27 ENCOUNTER — Inpatient Hospital Stay (HOSPITAL_COMMUNITY)
Admission: AD | Admit: 2015-09-27 | Discharge: 2015-09-27 | Disposition: A | Discharge status: Home / Self Care | Payer: Medicaid Other | Source: Ambulatory Visit | Attending: Family Medicine | Admitting: Family Medicine

## 2015-09-27 DIAGNOSIS — N898 Other specified noninflammatory disorders of vagina: Secondary | ICD-10-CM | POA: Insufficient documentation

## 2015-09-27 DIAGNOSIS — O9989 Other specified diseases and conditions complicating pregnancy, childbirth and the puerperium: Secondary | ICD-10-CM | POA: Diagnosis not present

## 2015-09-27 DIAGNOSIS — R109 Unspecified abdominal pain: Secondary | ICD-10-CM | POA: Insufficient documentation

## 2015-09-27 DIAGNOSIS — O26893 Other specified pregnancy related conditions, third trimester: Secondary | ICD-10-CM | POA: Diagnosis not present

## 2015-09-27 DIAGNOSIS — N909 Noninflammatory disorder of vulva and perineum, unspecified: Secondary | ICD-10-CM | POA: Diagnosis not present

## 2015-09-27 DIAGNOSIS — R102 Pelvic and perineal pain: Secondary | ICD-10-CM

## 2015-09-27 DIAGNOSIS — Z3A36 36 weeks gestation of pregnancy: Secondary | ICD-10-CM | POA: Insufficient documentation

## 2015-09-27 DIAGNOSIS — R300 Dysuria: Secondary | ICD-10-CM | POA: Diagnosis present

## 2015-09-27 DIAGNOSIS — Z87891 Personal history of nicotine dependence: Secondary | ICD-10-CM | POA: Insufficient documentation

## 2015-09-27 LAB — URINALYSIS, ROUTINE W REFLEX MICROSCOPIC
Bilirubin Urine: NEGATIVE
GLUCOSE, UA: NEGATIVE mg/dL
Hgb urine dipstick: NEGATIVE
Ketones, ur: 15 mg/dL — AB
NITRITE: NEGATIVE
PH: 6 (ref 5.0–8.0)
Protein, ur: NEGATIVE mg/dL
Specific Gravity, Urine: 1.025 (ref 1.005–1.030)

## 2015-09-27 LAB — WET PREP, GENITAL
CLUE CELLS WET PREP: NONE SEEN
Sperm: NONE SEEN
TRICH WET PREP: NONE SEEN
Yeast Wet Prep HPF POC: NONE SEEN

## 2015-09-27 LAB — URINE MICROSCOPIC-ADD ON

## 2015-09-27 MED ORDER — LIDOCAINE HCL 2 % EX GEL
1.0000 "application " | Freq: Once | CUTANEOUS | Status: AC
  Administered 2015-09-27: 1 via TOPICAL
  Filled 2015-09-27: qty 5

## 2015-09-27 NOTE — MAU Note (Signed)
Pt c/o pain with urination and vaginal discharge that itches. Pt denies contractions, LOF or vag bleeding. +FM

## 2015-09-27 NOTE — MAU Provider Note (Signed)
Chief Complaint:  Dysuria; Abdominal Pain; and Vaginal Discharge   Provider entered room at  0250 hrs   HPI  HPI: Kristin Powers is a 24 y.o. [phone] [id number] at 4636w6dwho presents to maternity admissions reporting dysuria, vaginal discharge and burning/itching on labia.  Thought she saw something white on her labia. She reports good fetal movement, denies LOF, vaginal bleeding, h/a, dizziness, n/v, or fever/chills.  She denies headache, visual changes or abdominal pain.   Past Medical History: Past Medical History  Diagnosis Date  . Pregnancy induced hypertension   . Depression   . Headache(784.0)   . Asthma   . Urinary tract infection   . Abnormal Pap smear   . Human papilloma virus   . Trichomonas   . Acid reflux   . Vaginal Pap smear, abnormal     Past obstetric history: OB History  Gravida Para Term Preterm AB SAB TAB Ectopic Multiple Living  4 2 2  0 1 0 1 0 0 2    # Outcome Date GA Lbr Len/2nd Weight Sex Delivery Anes PTL Lv  4 Current           3 Term 04/07/11 2427w0d  6 lb 2 oz (2.778 kg) F Vag-Spont   Y     Comments: induced measuring small  2 Term 04/05/10 7231w0d  4 lb (1.814 kg) M Vag-Spont EPI Y Y     Comments: induced PIH at 36wks  1 TAB 10/04/01             Comments: System Generated. Please review and update pregnancy details.      Past Surgical History: Past Surgical History  Procedure Laterality Date  . Induced abortion  March 2013    Family History: Family History  Problem Relation Age of Onset  . Anesthesia problems Neg Hx   . Hypotension Neg Hx   . Malignant hyperthermia Neg Hx   . Pseudochol deficiency Neg Hx   . Other Neg Hx   . Depression Mother   . Hypertension Mother   . Diabetes Mother   . Hypertension Father     Social History: Social History  Substance Use Topics  . Smoking status: Former Smoker -- 0.50 packs/day for 2 years    Types: Cigarettes  . Smokeless tobacco: Never Used  . Alcohol Use: No    Allergies: No Known  Allergies  Meds:  Prescriptions prior to admission  Medication Sig Dispense Refill Last Dose  . aspirin EC 81 MG tablet Take 1 tablet (81 mg total) by mouth daily. (Patient not taking: Reported on 09/15/2015) 90 tablet 1 Not Taking  . calcium carbonate (TUMS - DOSED IN MG ELEMENTAL CALCIUM) 500 MG chewable tablet Chew 2 tablets by mouth as needed for indigestion or heartburn. Reported on 09/17/2015   Not Taking  . cephALEXin (KEFLEX) 500 MG capsule Take 1 capsule (500 mg total) by mouth 4 (four) times daily. (Patient not taking: Reported on 09/15/2015) 28 capsule 0 Not Taking  . cyclobenzaprine (FLEXERIL) 10 MG tablet Take 1 tablet (10 mg total) by mouth every 8 (eight) hours as needed for muscle spasms. 30 tablet 1 Taking  . famotidine (PEPCID) 20 MG tablet Take 1 tablet (20 mg total) by mouth 2 (two) times daily. (Patient not taking: Reported on 09/17/2015) 60 tablet 3 Not Taking  . hydrocortisone 1 % ointment Apply 1 application topically 2 (two) times daily. (Patient not taking: Reported on 09/17/2015) 30 g 0 Not Taking  . metroNIDAZOLE (FLAGYL) 500 MG tablet  Take 1 tablet (500 mg total) by mouth 2 (two) times daily. (Patient not taking: Reported on 09/17/2015) 14 tablet 0 Not Taking  . omeprazole (PRILOSEC) 40 MG capsule Take 1 capsule (40 mg total) by mouth daily. 30 capsule 1   . oxyCODONE-acetaminophen (PERCOCET) 10-325 MG tablet Take 1 tablet by mouth every 4 (four) hours as needed for pain. (Patient not taking: Reported on 09/17/2015) 6 tablet 0 Not Taking  . oxyCODONE-acetaminophen (PERCOCET/ROXICET) 5-325 MG tablet Take 1 tablet by mouth every 4 (four) hours as needed. 6 tablet 0   . Prenatal Vit-Fe Fumarate-FA (MULTIVITAMIN-PRENATAL) 27-0.8 MG TABS tablet Take 1 tablet by mouth daily at 12 noon. Reported on 09/17/2015   Not Taking  . promethazine (PHENERGAN) 25 MG tablet Take 0.5-1 tablets (12.5-25 mg total) by mouth every 6 (six) hours as needed. (Patient not taking: Reported on  09/17/2015) 30 tablet 2 Not Taking  . terconazole (TERAZOL 7) 0.4 % vaginal cream Place 1 applicator vaginally at bedtime. 45 g 0     ROS:  Review of Systems  Constitutional: Negative for fever and chills.  Respiratory: Negative for shortness of breath.   Gastrointestinal: Negative for nausea, vomiting, abdominal pain, diarrhea and constipation.  Genitourinary: Positive for dysuria, vaginal discharge and vaginal pain. Negative for frequency, flank pain, vaginal bleeding and difficulty urinating.  Musculoskeletal: Negative for back pain.   I have reviewed patient's Past Medical Hx, Surgical Hx, Family Hx, Social Hx, medications and allergies.   Physical Exam  Patient Vitals for the past 24 hrs:  BP Temp Temp src Pulse Resp SpO2 Height Weight  09/27/15 0250 144/73 mmHg 98.6 F (37 C) Oral 118 20 100 % - -  09/27/15 0236 - - - - - -  (1.575 m) 193 lb 6.4 oz (87.726 kg)   Constitutional: Well-developed, well-nourished female in no acute distress.  Cardiovascular: normal rate Respiratory: normal effort GI: Abd soft, non-tender, gravid appropriate for gestational age.  MS: Extremities nontender, no edema, normal ROM Neurologic: Alert and oriented x 4.  GU: Neg CVAT.  PELVIC EXAM: Cervix pink, visually closed, without lesion, moderate white creamy discharge, vaginal walls and external genitalia normal, but tender to touch on right labia ?excoriation Bimanual exam: Cervix 0/long/high, firm, anterior, neg CMT, uterus nontender, adnexa without tenderness, enlargement, or mass  Dilation: Closed Effacement (%): Thick Cervical Position: Posterior Exam by:: D Simpson RN  FHT:  Baseline 140 , moderate variability, accelerations present, no decelerations Contractions: irregular, mild   Labs: Results for orders placed or performed during the hospital encounter of 09/27/15 (from the past 24 hour(s))  Urinalysis, Routine w reflex microscopic (not at [facility])     Status: Abnormal    Collection Time: 09/27/15  2:35 AM  Result Value Ref Range   Color, Urine YELLOW YELLOW   APPearance CLEAR CLEAR   Specific Gravity, Urine 1.025 1.005 - 1.030   pH 6.0 5.0 - 8.0   Glucose, UA NEGATIVE NEGATIVE mg/dL   Hgb urine dipstick NEGATIVE NEGATIVE   Bilirubin Urine NEGATIVE NEGATIVE   Ketones, ur 15 (A) NEGATIVE mg/dL   Protein, ur NEGATIVE NEGATIVE mg/dL   Nitrite NEGATIVE NEGATIVE   Leukocytes, UA MODERATE (A) NEGATIVE  Urine microscopic-add on     Status: Abnormal   Collection Time: 09/27/15  2:35 AM  Result Value Ref Range   Squamous Epithelial / LPF 0-5 (A) NONE SEEN   WBC, UA 6-30 0 - 5 WBC/hpf   RBC / HPF 6-30 0 - 5 RBC/hpf  Bacteria, UA FEW (A) NONE SEEN  Wet prep, genital     Status: Abnormal   Collection Time: 09/27/15  3:00 AM  Result Value Ref Range   Yeast Wet Prep HPF POC NONE SEEN NONE SEEN   Trich, Wet Prep NONE SEEN NONE SEEN   Clue Cells Wet Prep HPF POC NONE SEEN NONE SEEN   WBC, Wet Prep HPF POC MANY (A) NONE SEEN   Sperm NONE SEEN    O/Negative/-- (09/12 0000)  Imaging:  No results found.  MAU Course/MDM: I have ordered labs and reviewed results.  NST reviewed  Treatments in MAU included topical lidocaine Pt stable at time of discharge.   Assessment: SIUP at [redacted]w[redacted]d  Vaginal discharge, no BV or yeast Labial irritation, probably excoriation from rubbing  Plan: Discharge home Lidocaine given to use at home Perineal hygeine Labor precautions and fetal kick counts Follow up in Office for prenatal visits and recheck    Medication List    ASK your doctor about these medications        aspirin EC 81 MG tablet  Take 1 tablet (81 mg total) by mouth daily.     calcium carbonate 500 MG chewable tablet  Commonly known as:  TUMS - dosed in mg elemental calcium  Chew 2 tablets by mouth as needed for indigestion or heartburn. Reported on 09/17/2015     cephALEXin 500 MG capsule  Commonly known as:  KEFLEX  Take 1 capsule (500 mg total)  by mouth 4 (four) times daily.     cyclobenzaprine 10 MG tablet  Commonly known as:  FLEXERIL  Take 1 tablet (10 mg total) by mouth every 8 (eight) hours as needed for muscle spasms.     famotidine 20 MG tablet  Commonly known as:  PEPCID  Take 1 tablet (20 mg total) by mouth 2 (two) times daily.     hydrocortisone 1 % ointment  Apply 1 application topically 2 (two) times daily.     metroNIDAZOLE 500 MG tablet  Commonly known as:  FLAGYL  Take 1 tablet (500 mg total) by mouth 2 (two) times daily.     multivitamin-prenatal 27-0.8 MG Tabs tablet  Take 1 tablet by mouth daily at 12 noon. Reported on 09/17/2015     omeprazole 40 MG capsule  Commonly known as:  PRILOSEC  Take 1 capsule (40 mg total) by mouth daily.     oxyCODONE-acetaminophen 10-325 MG tablet  Commonly known as:  PERCOCET  Take 1 tablet by mouth every 4 (four) hours as needed for pain.     oxyCODONE-acetaminophen 5-325 MG tablet  Commonly known as:  PERCOCET/ROXICET  Take 1 tablet by mouth every 4 (four) hours as needed.     promethazine 25 MG tablet  Commonly known as:  PHENERGAN  Take 0.5-1 tablets (12.5-25 mg total) by mouth every 6 (six) hours as needed.     terconazole 0.4 % vaginal cream  Commonly known as:  TERAZOL 7  Place 1 applicator vaginally at bedtime.        Wynelle Bourgeois CNM, MSN Certified Nurse-Midwife 09/27/2015 3:37 AM

## 2015-09-27 NOTE — Discharge Instructions (Signed)

## 2015-09-28 LAB — CULTURE, OB URINE: Special Requests: NORMAL

## 2015-10-01 ENCOUNTER — Encounter: Payer: Self-pay | Admitting: Family

## 2015-10-02 ENCOUNTER — Other Ambulatory Visit: Payer: Self-pay | Admitting: Family Medicine

## 2015-10-02 MED ORDER — CYCLOBENZAPRINE HCL 10 MG PO TABS: 10.0000 mg | ORAL_TABLET | Freq: Three times a day (TID) | ORAL | Status: DC | PRN

## 2015-10-02 MED ORDER — PROMETHAZINE HCL 25 MG PO TABS: 12.5000 mg | ORAL_TABLET | Freq: Four times a day (QID) | ORAL | Status: DC | PRN

## 2015-10-05 NOTE — L&D Delivery Note (Signed)
Patient is 25 y.o. [id number] [redacted]w[redacted]d admitted for PROM 1/18 @ 1000   Delivery Note At 6:58 AM a viable female was delivered via Vaginal, Spontaneous Delivery (Presentation: Middle Occiput Anterior). Fetus had tight nuchal x 1 which was not reducible. Therefore summersault maneuver was used during delivery. Terminal meconium noted.  APGAR: 6, 8; weight: pending .   Placenta status: Intact, Spontaneous.  Cord: 3 vessels with the following complications: None.  Cord pH: not drawn  Anesthesia: Epidural  Episiotomy: None Lacerations: None; periurethral abrasions not requiring repair  Suture Repair: n/a Est. Blood Loss (mL): 300  Mom to postpartum.  Baby to Couplet care / Skin to Skin.  [name] 10/23/2015, 7:18 AM   Upon arrival patient was complete and pushing. She pushed with good maternal effort to deliver a healthy baby girl. Baby delivered without difficulty, was noted to have good tone and place on maternal abdomen for oral suctioning, drying and stimulation. Delayed cord clamping performed. Placenta delivered intact with 3V cord. Vaginal canal and perineum was inspected and abrasions were noted in the periurethral area that did not require repair ; hemostatic. Pitocin was started and uterus massaged until bleeding slowed. Counts of sharps, instruments, and lap pads were all correct.   [name], MD PGY 1 Family Medicine   Patient is a [phone] at [redacted]w[redacted]d who was admitted w/ PROM, uncomplicated prenatal course.  She progressed with augmentation via cytotec and Pit.  I was gloved and present for delivery in its entirety.  Second stage of labor progressed to SVD  Complications: none  Lacerations: none  EBL:  Cam Hai, CNM 8:37 AM  10/23/2015

## 2015-10-08 ENCOUNTER — Encounter: Payer: Self-pay | Admitting: Family Medicine

## 2015-10-08 ENCOUNTER — Ambulatory Visit (INDEPENDENT_AMBULATORY_CARE_PROVIDER_SITE_OTHER): Payer: Medicaid Other | Admitting: Family

## 2015-10-08 VITALS — BP 139/82 | HR 101 | Temp 97.8°F | Wt 193.0 lb

## 2015-10-08 DIAGNOSIS — O0993 Supervision of high risk pregnancy, unspecified, third trimester: Secondary | ICD-10-CM | POA: Diagnosis present

## 2015-10-08 DIAGNOSIS — Z23 Encounter for immunization: Secondary | ICD-10-CM

## 2015-10-08 DIAGNOSIS — O360930 Maternal care for other rhesus isoimmunization, third trimester, not applicable or unspecified: Secondary | ICD-10-CM

## 2015-10-08 DIAGNOSIS — Z6741 Type O blood, Rh negative: Secondary | ICD-10-CM

## 2015-10-08 LAB — POCT URINALYSIS DIP (DEVICE)
Bilirubin Urine: NEGATIVE
Glucose, UA: NEGATIVE mg/dL
HGB URINE DIPSTICK: NEGATIVE
Ketones, ur: NEGATIVE mg/dL
LEUKOCYTES UA: NEGATIVE
NITRITE: NEGATIVE
PH: 7 (ref 5.0–8.0)
Protein, ur: NEGATIVE mg/dL
Specific Gravity, Urine: 1.015 (ref 1.005–1.030)
UROBILINOGEN UA: 0.2 mg/dL (ref 0.0–1.0)

## 2015-10-08 MED ORDER — RHO D IMMUNE GLOBULIN 1500 UNIT/2ML IJ SOSY
300.0000 ug | PREFILLED_SYRINGE | Freq: Once | INTRAMUSCULAR | Status: AC
  Administered 2015-10-08: 300 ug via INTRAMUSCULAR

## 2015-10-08 MED ORDER — TETANUS-DIPHTH-ACELL PERTUSSIS 5-2.5-18.5 LF-MCG/0.5 IM SUSP: 0.5000 mL | Freq: Once | INTRAMUSCULAR | Status: DC

## 2015-10-08 NOTE — Progress Notes (Signed)
Subjective:  Kristin Powers is a 25 y.o. [phone] at 3753w3d being seen today for ongoing prenatal care.  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  Pt has missed prior OB appts.  Patient reports backache.  Verbalized desire to be induced now.   Contractions: Irregular. Vag. Bleeding: None.  Movement: Present. Denies leaking of fluid.   The following portions of the patient's history were reviewed and updated as appropriate: allergies, current medications, past family history, past medical history, past social history, past surgical history and problem list. Problem list updated.  Objective:   Filed Vitals:   10/08/15 1152  BP: 139/82  Pulse: 101  Temp: 97.8 F (36.6 C)  Weight: 193 lb (87.544 kg)    Fetal Status: Fetal Heart Rate (bpm): 152 Fundal Height: 37 cm Movement: Present  Presentation: Vertex  General:  Alert, oriented and cooperative. Patient is in no acute distress.  Skin: Skin is warm and dry. No rash noted.   Cardiovascular: Normal heart rate noted  Respiratory: Normal respiratory effort, no problems with respiration noted  Abdomen: Soft, gravid, appropriate for gestational age. Pain/Pressure: Present     Pelvic: Vag. Bleeding: None     Cervical exam performed Dilation: 1 Effacement (%): Thick    Extremities: Normal range of motion.  Edema: Trace  Mental Status: Normal mood and affect. Normal behavior. Normal judgment and thought content.   Urinalysis: Urine Protein: Negative Urine Glucose: Negative  Assessment and Plan:  Pregnancy: [id number] at 4653w3d  1. Supervision of high risk pregnancy, antepartum, third trimester - rho (d) immune globulin  (RHIG/RHOPHYLAC) injection 300 mcg; Inject 2 mLs (300 mcg total) into the muscle once. - Flu Vaccine QUAD 36+ mos IM - Explained IOL policy and indications for IOL prior to 41 wks  2. Type o blood, rh negative - rho (d) immune globulin (RHIG/RHOPHYLAC) injection 300 mcg; Inject 2 mLs (300 mcg total) into the muscle once.   Term labor symptoms and general obstetric precautions including but not limited to vaginal bleeding, contractions, leaking of fluid and fetal movement were reviewed in detail with the patient. Please refer to After Visit Summary for other counseling recommendations.  Return in about 1 week (around 10/15/2015).   Eino FarberWalidah Kennith GainN Karim, CNM

## 2015-10-08 NOTE — Progress Notes (Signed)
Breastfeeding tip of the week reviewed. Pt reports UC's q10 minutes since last night.

## 2015-10-12 ENCOUNTER — Encounter (HOSPITAL_COMMUNITY): Payer: Self-pay | Admitting: *Deleted

## 2015-10-12 ENCOUNTER — Inpatient Hospital Stay (HOSPITAL_COMMUNITY)
Admission: EM | Admit: 2015-10-12 | Discharge: 2015-10-12 | Disposition: A | Discharge status: Home / Self Care | Payer: Medicaid Other | Source: Ambulatory Visit | Attending: Obstetrics and Gynecology | Admitting: Obstetrics and Gynecology

## 2015-10-12 DIAGNOSIS — Z3493 Encounter for supervision of normal pregnancy, unspecified, third trimester: Secondary | ICD-10-CM | POA: Insufficient documentation

## 2015-10-12 LAB — URINALYSIS, ROUTINE W REFLEX MICROSCOPIC
BILIRUBIN URINE: NEGATIVE
GLUCOSE, UA: NEGATIVE mg/dL
HGB URINE DIPSTICK: NEGATIVE
KETONES UR: NEGATIVE mg/dL
Leukocytes, UA: NEGATIVE
Nitrite: NEGATIVE
PROTEIN: NEGATIVE mg/dL
Specific Gravity, Urine: 1.01 (ref 1.005–1.030)
pH: 6 (ref 5.0–8.0)

## 2015-10-12 MED ORDER — OXYCODONE-ACETAMINOPHEN 5-325 MG PO TABS
1.0000 | ORAL_TABLET | Freq: Once | ORAL | Status: AC
  Administered 2015-10-12: 1 via ORAL
  Filled 2015-10-12: qty 1

## 2015-10-12 NOTE — Discharge Instructions (Signed)
Braxton Hicks Contractions °Contractions of the uterus can occur throughout pregnancy. Contractions are not always a sign that you are in labor.  °WHAT ARE BRAXTON HICKS CONTRACTIONS?  °Contractions that occur before labor are called Braxton Hicks contractions, or false labor. Toward the end of pregnancy (32-34 weeks), these contractions can develop more often and may become more forceful. This is not true labor because these contractions do not result in opening (dilatation) and thinning of the cervix. They are sometimes difficult to tell apart from true labor because these contractions can be forceful and people have different pain tolerances. You should not feel embarrassed if you go to the hospital with false labor. Sometimes, the only way to tell if you are in true labor is for your health care provider to look for changes in the cervix. °If there are no prenatal problems or other health problems associated with the pregnancy, it is completely safe to be sent home with false labor and await the onset of true labor. °HOW CAN YOU TELL THE DIFFERENCE BETWEEN TRUE AND FALSE LABOR? °False Labor °· The contractions of false labor are usually shorter and not as hard as those of true labor.   °· The contractions are usually irregular.   °· The contractions are often felt in the front of the lower abdomen and in the groin.   °· The contractions may go away when you walk around or change positions while lying down.   °· The contractions get weaker and are shorter lasting as time goes on.   °· The contractions do not usually become progressively stronger, regular, and closer together as with true labor.   °True Labor °· Contractions in true labor last 30-70 seconds, become very regular, usually become more intense, and increase in frequency.   °· The contractions do not go away with walking.   °· The discomfort is usually felt in the top of the uterus and spreads to the lower abdomen and low back.   °· True labor can be  determined by your health care provider with an exam. This will show that the cervix is dilating and getting thinner.   °WHAT TO REMEMBER °· Keep up with your usual exercises and follow other instructions given by your health care provider.   °· Take medicines as directed by your health care provider.   °· Keep your regular prenatal appointments.   °· Eat and drink lightly if you think you are going into labor.   °· If Braxton Hicks contractions are making you uncomfortable:   °¨ Change your position from lying down or resting to walking, or from walking to resting.   °¨ Sit and rest in a tub of warm water.   °¨ Drink 2-3 glasses of water. Dehydration may cause these contractions.   °¨ Do slow and deep breathing several times an hour.   °WHEN SHOULD I SEEK IMMEDIATE MEDICAL CARE? °Seek immediate medical care if: °· Your contractions become stronger, more regular, and closer together.   °· You have fluid leaking or gushing from your vagina.   °· You have a fever.   °· You pass blood-tinged mucus.   °· You have vaginal bleeding.   °· You have continuous abdominal pain.   °· You have low back pain that you never had before.   °· You feel your baby's head pushing down and causing pelvic pressure.   °· Your baby is not moving as much as it used to.   °  °This information is not intended to replace advice given to you by your health care provider. Make sure you discuss any questions you have with your health care   provider. °  °Document Released: 09/20/2005 Document Revised: 09/25/2013 Document Reviewed: 07/02/2013 °Elsevier Interactive Patient Education ©2016 [employer] °Fetal Movement Counts °Patient Name: __________________________________________________ Patient Due Date: ____________________ °Performing a fetal movement count is highly recommended in high-risk pregnancies, but it is good for every pregnant woman to do. Your health care provider may ask you to start counting fetal movements at 28 weeks of the  pregnancy. Fetal movements often increase: °· After eating a full meal. °· After physical activity. °· After eating or drinking something sweet or cold. °· At rest. °Pay attention to when you feel the baby is most active. This will help you notice a pattern of your baby's sleep and wake cycles and what factors contribute to an increase in fetal movement. It is important to perform a fetal movement count at the same time each day when your baby is normally most active.  °HOW TO COUNT FETAL MOVEMENTS °· Find a quiet and comfortable area to sit or lie down on your left side. Lying on your left side provides the best blood and oxygen circulation to your baby. °· Write down the day and time on a sheet of paper or in a journal. °· Start counting kicks, flutters, swishes, rolls, or jabs in a 2-hour period. You should feel at least 10 movements within 2 hours. °· If you do not feel 10 movements in 2 hours, wait 2-3 hours and count again. Look for a change in the pattern or not enough counts in 2 hours. °SEEK MEDICAL CARE IF: °· You feel less than 10 counts in 2 hours, tried twice. °· There is no movement in over an hour. °· The pattern is changing or taking longer each day to reach 10 counts in 2 hours. °· You feel the baby is not moving as he or she usually does. °Date: ____________ Movements: ____________ Start time: ____________ Finish time: ____________  °Date: ____________ Movements: ____________ Start time: ____________ Finish time: ____________ °Date: ____________ Movements: ____________ Start time: ____________ Finish time: ____________ °Date: ____________ Movements: ____________ Start time: ____________ Finish time: ____________ °Date: ____________ Movements: ____________ Start time: ____________ Finish time: ____________ °Date: ____________ Movements: ____________ Start time: ____________ Finish time: ____________ °Date: ____________ Movements: ____________ Start time: ____________ Finish time: ____________ °Date:  ____________ Movements: ____________ Start time: ____________ Finish time: ____________  °Date: ____________ Movements: ____________ Start time: ____________ Finish time: ____________ °Date: ____________ Movements: ____________ Start time: ____________ Finish time: ____________ °Date: ____________ Movements: ____________ Start time: ____________ Finish time: ____________ °Date: ____________ Movements: ____________ Start time: ____________ Finish time: ____________ °Date: ____________ Movements: ____________ Start time: ____________ Finish time: ____________ °Date: ____________ Movements: ____________ Start time: ____________ Finish time: ____________ °Date: ____________ Movements: ____________ Start time: ____________ Finish time: ____________  °Date: ____________ Movements: ____________ Start time: ____________ Finish time: ____________ °Date: ____________ Movements: ____________ Start time: ____________ Finish time: ____________ °Date: ____________ Movements: ____________ Start time: ____________ Finish time: ____________ °Date: ____________ Movements: ____________ Start time: ____________ Finish time: ____________ °Date: ____________ Movements: ____________ Start time: ____________ Finish time: ____________ °Date: ____________ Movements: ____________ Start time: ____________ Finish time: ____________ °Date: ____________ Movements: ____________ Start time: ____________ Finish time: ____________  °Date: ____________ Movements: ____________ Start time: ____________ Finish time: ____________ °Date: ____________ Movements: ____________ Start time: ____________ Finish time: ____________ °Date: ____________ Movements: ____________ Start time: ____________ Finish time: ____________ °Date: ____________ Movements: ____________ Start time: ____________ Finish time: ____________ °Date: ____________ Movements: ____________ Start time: ____________ Finish time: ____________ °Date: ____________ Movements: ____________ Start  time: ____________ Finish time:   ____________ °Date: ____________ Movements: ____________ Start time: ____________ Finish time: ____________  °Date: ____________ Movements: ____________ Start time: ____________ Finish time: ____________ °Date: ____________ Movements: ____________ Start time: ____________ Finish time: ____________ °Date: ____________ Movements: ____________ Start time: ____________ Finish time: ____________ °Date: ____________ Movements: ____________ Start time: ____________ Finish time: ____________ °Date: ____________ Movements: ____________ Start time: ____________ Finish time: ____________ °Date: ____________ Movements: ____________ Start time: ____________ Finish time: ____________ °Date: ____________ Movements: ____________ Start time: ____________ Finish time: ____________  °Date: ____________ Movements: ____________ Start time: ____________ Finish time: ____________ °Date: ____________ Movements: ____________ Start time: ____________ Finish time: ____________ °Date: ____________ Movements: ____________ Start time: ____________ Finish time: ____________ °Date: ____________ Movements: ____________ Start time: ____________ Finish time: ____________ °Date: ____________ Movements: ____________ Start time: ____________ Finish time: ____________ °Date: ____________ Movements: ____________ Start time: ____________ Finish time: ____________ °Date: ____________ Movements: ____________ Start time: ____________ Finish time: ____________  °Date: ____________ Movements: ____________ Start time: ____________ Finish time: ____________ °Date: ____________ Movements: ____________ Start time: ____________ Finish time: ____________ °Date: ____________ Movements: ____________ Start time: ____________ Finish time: ____________ °Date: ____________ Movements: ____________ Start time: ____________ Finish time: ____________ °Date: ____________ Movements: ____________ Start time: ____________ Finish time:  ____________ °Date: ____________ Movements: ____________ Start time: ____________ Finish time: ____________ °Date: ____________ Movements: ____________ Start time: ____________ Finish time: ____________  °Date: ____________ Movements: ____________ Start time: ____________ Finish time: ____________ °Date: ____________ Movements: ____________ Start time: ____________ Finish time: ____________ °Date: ____________ Movements: ____________ Start time: ____________ Finish time: ____________ °Date: ____________ Movements: ____________ Start time: ____________ Finish time: ____________ °Date: ____________ Movements: ____________ Start time: ____________ Finish time: ____________ °Date: ____________ Movements: ____________ Start time: ____________ Finish time: ____________ °  °This information is not intended to replace advice given to you by your health care provider. Make sure you discuss any questions you have with your health care provider. °  °Document Released: 10/20/2006 Document Revised: 10/11/2014 Document Reviewed: 07/17/2012 °Elsevier Interactive Patient Education ©2016 [employer] Inc[employer] ° °

## 2015-10-12 NOTE — MAU Note (Signed)
Pt reports UC's since last night and some clear leaking last night. Today she is having pressure and "bloody mucus" discharge. Pt reports that the movents have been less over the last couple of days.

## 2015-10-13 ENCOUNTER — Encounter: Payer: Medicaid Other | Admitting: Obstetrics & Gynecology

## 2015-10-15 ENCOUNTER — Encounter: Payer: Medicaid Other | Admitting: Advanced Practice Midwife

## 2015-10-16 ENCOUNTER — Ambulatory Visit (INDEPENDENT_AMBULATORY_CARE_PROVIDER_SITE_OTHER): Payer: Medicaid Other | Admitting: Family Medicine

## 2015-10-16 VITALS — BP 138/76 | HR 87 | Temp 98.4°F | Wt 195.5 lb

## 2015-10-16 DIAGNOSIS — O09293 Supervision of pregnancy with other poor reproductive or obstetric history, third trimester: Secondary | ICD-10-CM

## 2015-10-16 DIAGNOSIS — O0993 Supervision of high risk pregnancy, unspecified, third trimester: Secondary | ICD-10-CM

## 2015-10-16 DIAGNOSIS — O26899 Other specified pregnancy related conditions, unspecified trimester: Secondary | ICD-10-CM | POA: Insufficient documentation

## 2015-10-16 DIAGNOSIS — O09893 Supervision of other high risk pregnancies, third trimester: Secondary | ICD-10-CM

## 2015-10-16 DIAGNOSIS — O360131 Maternal care for anti-D [Rh] antibodies, third trimester, fetus 1: Secondary | ICD-10-CM

## 2015-10-16 DIAGNOSIS — Z6791 Unspecified blood type, Rh negative: Secondary | ICD-10-CM | POA: Insufficient documentation

## 2015-10-16 LAB — POCT URINALYSIS DIP (DEVICE)
BILIRUBIN URINE: NEGATIVE
BILIRUBIN URINE: NEGATIVE
GLUCOSE, UA: NEGATIVE mg/dL
GLUCOSE, UA: NEGATIVE mg/dL
Hgb urine dipstick: NEGATIVE
Hgb urine dipstick: NEGATIVE
KETONES UR: NEGATIVE mg/dL
NITRITE: NEGATIVE
NITRITE: NEGATIVE
Protein, ur: NEGATIVE mg/dL
Protein, ur: NEGATIVE mg/dL
Specific Gravity, Urine: >=1.03 (ref 1.005–1.030)
Specific Gravity, Urine: 1.02 (ref 1.005–1.030)
UROBILINOGEN UA: 0.2 mg/dL (ref 0.0–1.0)
Urobilinogen, UA: 0.2 mg/dL (ref 0.0–1.0)
pH: 5.5 (ref 5.0–8.0)
pH: 6 (ref 5.0–8.0)

## 2015-10-16 MED ORDER — ZOLPIDEM TARTRATE 5 MG PO TABS: 5.0000 mg | ORAL_TABLET | Freq: Every evening | ORAL | Status: DC | PRN

## 2015-10-16 MED ORDER — OXYCODONE-ACETAMINOPHEN 5-325 MG PO TABS: 1.0000 | ORAL_TABLET | ORAL | Status: DC | PRN

## 2015-10-16 NOTE — Patient Instructions (Signed)

## 2015-10-16 NOTE — Progress Notes (Signed)
Subjective:  Kristin Powers is a 25 y.o. [id number] at 4365w4d being seen today for ongoing prenatal care.  She is currently monitored for the following issues for this low-risk pregnancy and has History of prior pregnancy with IUGR newborn; Asthma; Depression; Prior pregnancy complicated by PIH, antepartum; Obesity; Type o blood, rh negative; Positive urine drug screen; Supervision of high risk pregnancy, antepartum; Group B Streptococcus carrier, +RV culture, currently pregnant; and Rh negative status during pregnancy, antepartum on her problem list.  Patient reports no complaints.  Contractions: Irregular. Vag. Bleeding: None.  Movement: Present. Denies leaking of fluid.   The following portions of the patient's history were reviewed and updated as appropriate: allergies, current medications, past family history, past medical history, past social history, past surgical history and problem list. Problem list updated.  Objective:   Filed Vitals:   10/16/15 1150  BP: 138/76  Pulse: 87  Temp: 98.4 F (36.9 C)  Weight: 195 lb 8 oz (88.678 kg)    Fetal Status: Fetal Heart Rate (bpm): 150 Fundal Height: 40 cm Movement: Present  Presentation: Vertex  General:  Alert, oriented and cooperative. Patient is in no acute distress.  Skin: Skin is warm and dry. No rash noted.   Cardiovascular: Normal heart rate noted  Respiratory: Normal respiratory effort, no problems with respiration noted  Abdomen: Soft, gravid, appropriate for gestational age. Pain/Pressure: Present     Pelvic: Vag. Bleeding: None     Cervical exam performed Dilation: 2 Effacement (%): Thick Station: -3  Extremities: Normal range of motion.  Edema: Trace  Mental Status: Normal mood and affect. Normal behavior. Normal judgment and thought content.   Urinalysis:      Assessment and Plan:  Pregnancy: [id number] at 1865w4d  1. Prior pregnancy complicated by PIH, antepartum, third trimester -normotensive today  2. Rh negative status  during pregnancy, antepartum, third trimester, fetus 1 Rhogam recieved  3. Supervision of high risk pregnancy, antepartum, third trimester Swept membranes Offered therapeutic rest- Rx for percocet #15 given, Ambien#5 given.  NST at 40wks Appt with MD in 1 week Scheduled IOL 1/22 at 12AM- postdates  Term labor symptoms and general obstetric precautions including but not limited to vaginal bleeding, contractions, leaking of fluid and fetal movement were reviewed in detail with the patient. Please refer to After Visit Summary for other counseling recommendations.  Return in about 3 days (around 10/19/2015) for NST.  Future Appointments Date Time Provider Department Center  10/20/2015 1:00 PM [facility] NST [facility]  10/23/2015 10:35 AM Marlis EdelsonWalidah N Karim, CNM [facility]  10/26/2015 12:00 AM [facility] ROOM [facility] None   Federico FlakeKimberly Niles Newton, MD

## 2015-10-16 NOTE — Progress Notes (Signed)
Pt would like to be check today. She is in a lot of pain in legs and back.

## 2015-10-19 ENCOUNTER — Inpatient Hospital Stay (HOSPITAL_COMMUNITY)
Admission: AD | Admit: 2015-10-19 | Discharge: 2015-10-19 | Disposition: A | Discharge status: Home / Self Care | Payer: Medicaid Other | Source: Ambulatory Visit | Attending: Obstetrics & Gynecology | Admitting: Obstetrics & Gynecology

## 2015-10-19 ENCOUNTER — Encounter (HOSPITAL_COMMUNITY): Payer: Self-pay | Admitting: *Deleted

## 2015-10-19 DIAGNOSIS — Z3493 Encounter for supervision of normal pregnancy, unspecified, third trimester: Secondary | ICD-10-CM | POA: Diagnosis not present

## 2015-10-19 DIAGNOSIS — O360131 Maternal care for anti-D [Rh] antibodies, third trimester, fetus 1: Secondary | ICD-10-CM

## 2015-10-19 DIAGNOSIS — O99820 Streptococcus B carrier state complicating pregnancy: Secondary | ICD-10-CM

## 2015-10-19 MED ORDER — LACTATED RINGERS IV BOLUS (SEPSIS): 1000.0000 mL | Freq: Once | INTRAVENOUS | Status: DC

## 2015-10-19 NOTE — MAU Note (Signed)
Contractions all night. Denies vaginal bleeding

## 2015-10-19 NOTE — Progress Notes (Signed)
Pt declined IV fluids at this time.  Pt decided to go home

## 2015-10-19 NOTE — MAU Note (Signed)
Called Kristin Powers CNM as patient presents to the desk stating she is not able to go home because she is too uncomfortable, requested CNM come to discuss plan of care with patient.

## 2015-10-20 ENCOUNTER — Other Ambulatory Visit: Payer: Medicaid Other

## 2015-10-21 ENCOUNTER — Encounter: Payer: Medicaid Other | Admitting: Advanced Practice Midwife

## 2015-10-22 ENCOUNTER — Inpatient Hospital Stay (HOSPITAL_COMMUNITY)
Admission: AD | Admit: 2015-10-22 | Discharge: 2015-10-25 | DRG: 775 | Disposition: A | Discharge status: Home / Self Care | Payer: Medicaid Other | Source: Ambulatory Visit | Attending: Obstetrics and Gynecology | Admitting: Obstetrics and Gynecology

## 2015-10-22 ENCOUNTER — Encounter (HOSPITAL_COMMUNITY): Payer: Self-pay

## 2015-10-22 DIAGNOSIS — Z6835 Body mass index (BMI) 35.0-35.9, adult: Secondary | ICD-10-CM

## 2015-10-22 DIAGNOSIS — Z3A40 40 weeks gestation of pregnancy: Secondary | ICD-10-CM

## 2015-10-22 DIAGNOSIS — O42119 Preterm premature rupture of membranes, onset of labor more than 24 hours following rupture, unspecified trimester: Secondary | ICD-10-CM

## 2015-10-22 DIAGNOSIS — Z8249 Family history of ischemic heart disease and other diseases of the circulatory system: Secondary | ICD-10-CM

## 2015-10-22 DIAGNOSIS — Z3490 Encounter for supervision of normal pregnancy, unspecified, unspecified trimester: Secondary | ICD-10-CM

## 2015-10-22 DIAGNOSIS — O99820 Streptococcus B carrier state complicating pregnancy: Secondary | ICD-10-CM

## 2015-10-22 DIAGNOSIS — Z87891 Personal history of nicotine dependence: Secondary | ICD-10-CM

## 2015-10-22 DIAGNOSIS — O4292 Full-term premature rupture of membranes, unspecified as to length of time between rupture and onset of labor: Principal | ICD-10-CM | POA: Diagnosis present

## 2015-10-22 DIAGNOSIS — Z833 Family history of diabetes mellitus: Secondary | ICD-10-CM

## 2015-10-22 DIAGNOSIS — F329 Major depressive disorder, single episode, unspecified: Secondary | ICD-10-CM | POA: Diagnosis present

## 2015-10-22 DIAGNOSIS — O99214 Obesity complicating childbirth: Secondary | ICD-10-CM | POA: Diagnosis present

## 2015-10-22 DIAGNOSIS — O360131 Maternal care for anti-D [Rh] antibodies, third trimester, fetus 1: Secondary | ICD-10-CM

## 2015-10-22 DIAGNOSIS — O26893 Other specified pregnancy related conditions, third trimester: Secondary | ICD-10-CM | POA: Diagnosis present

## 2015-10-22 DIAGNOSIS — O4212 Full-term premature rupture of membranes, onset of labor more than 24 hours following rupture: Secondary | ICD-10-CM | POA: Diagnosis present

## 2015-10-22 DIAGNOSIS — O99824 Streptococcus B carrier state complicating childbirth: Secondary | ICD-10-CM | POA: Diagnosis present

## 2015-10-22 DIAGNOSIS — O99344 Other mental disorders complicating childbirth: Secondary | ICD-10-CM | POA: Diagnosis present

## 2015-10-22 DIAGNOSIS — Z6791 Unspecified blood type, Rh negative: Secondary | ICD-10-CM

## 2015-10-22 LAB — CBC
HCT: 30.7 % — ABNORMAL LOW (ref 36.0–46.0)
HEMOGLOBIN: 10 g/dL — AB (ref 12.0–15.0)
MCH: 27.9 pg (ref 26.0–34.0)
MCHC: 32.6 g/dL (ref 30.0–36.0)
MCV: 85.5 fL (ref 78.0–100.0)
PLATELETS: 160 10*3/uL (ref 150–400)
RBC: 3.59 MIL/uL — AB (ref 3.87–5.11)
RDW: 13.9 % (ref 11.5–15.5)
WBC: 10.1 10*3/uL (ref 4.0–10.5)

## 2015-10-22 MED ORDER — LACTATED RINGERS IV SOLN: 500.0000 mL | INTRAVENOUS | Status: DC | PRN

## 2015-10-22 MED ORDER — LIDOCAINE HCL (PF) 1 % IJ SOLN
30.0000 mL | INTRAMUSCULAR | Status: DC | PRN
  Filled 2015-10-22: qty 30

## 2015-10-22 MED ORDER — LACTATED RINGERS IV SOLN
INTRAVENOUS | Status: DC
  Administered 2015-10-22 (×2): via INTRAVENOUS

## 2015-10-22 MED ORDER — TERBUTALINE SULFATE 1 MG/ML IJ SOLN: 0.2500 mg | Freq: Once | INTRAMUSCULAR | Status: DC | PRN

## 2015-10-22 MED ORDER — SODIUM CHLORIDE 0.9 % IV SOLN
2.0000 g | Freq: Once | INTRAVENOUS | Status: AC
  Administered 2015-10-22: 2 g via INTRAVENOUS
  Filled 2015-10-22: qty 2000

## 2015-10-22 MED ORDER — OXYTOCIN 10 UNIT/ML IJ SOLN: 2.5000 [IU]/h | INTRAVENOUS | Status: DC

## 2015-10-22 MED ORDER — OXYCODONE-ACETAMINOPHEN 5-325 MG PO TABS: 1.0000 | ORAL_TABLET | ORAL | Status: DC | PRN

## 2015-10-22 MED ORDER — OXYTOCIN BOLUS FROM INFUSION: 500.0000 mL | INTRAVENOUS | Status: DC

## 2015-10-22 MED ORDER — ONDANSETRON HCL 4 MG/2ML IJ SOLN
4.0000 mg | Freq: Four times a day (QID) | INTRAMUSCULAR | Status: DC | PRN
Start: 2015-10-22 — End: 2015-10-23

## 2015-10-22 MED ORDER — ACETAMINOPHEN 325 MG PO TABS: 650.0000 mg | ORAL_TABLET | ORAL | Status: DC | PRN

## 2015-10-22 MED ORDER — OXYCODONE-ACETAMINOPHEN 5-325 MG PO TABS: 2.0000 | ORAL_TABLET | ORAL | Status: DC | PRN

## 2015-10-22 MED ORDER — DEXTROSE 5 % IV SOLN
2.5000 10*6.[IU] | INTRAVENOUS | Status: DC
  Administered 2015-10-22 – 2015-10-23 (×3): 2.5 10*6.[IU] via INTRAVENOUS
  Filled 2015-10-22 (×6): qty 2.5

## 2015-10-22 MED ORDER — CITRIC ACID-SODIUM CITRATE 334-500 MG/5ML PO SOLN
30.0000 mL | ORAL | Status: DC | PRN
  Administered 2015-10-22 (×2): 30 mL via ORAL
  Filled 2015-10-22 (×2): qty 15

## 2015-10-22 MED ORDER — OXYTOCIN 10 UNIT/ML IJ SOLN
1.0000 m[IU]/min | INTRAMUSCULAR | Status: DC
  Administered 2015-10-22: 2 m[IU]/min via INTRAVENOUS
  Filled 2015-10-22: qty 4

## 2015-10-22 MED ORDER — FENTANYL CITRATE (PF) 100 MCG/2ML IJ SOLN
100.0000 ug | INTRAMUSCULAR | Status: DC | PRN
  Administered 2015-10-22 – 2015-10-23 (×4): 100 ug via INTRAVENOUS
  Filled 2015-10-22 (×4): qty 2

## 2015-10-22 NOTE — Progress Notes (Signed)
Labor Progress Note  Kristin Powers is a 25 y.o. [id number] at [redacted]w[redacted]d  admitted for PROM 1/18 1000.   S:  Patient is doing well. No questions or concerns. Would like to eventually have an epidural for pain management; currently has PRN Fentanyl  O:  BP 131/87 mmHg  Pulse 80  Temp(Src) 98.8 F (37.1 C) (Oral)  Resp 18  Ht  (1.575 m)  Wt 87.998 kg (194 lb)  BMI 35.47 kg/m2  LMP 01/12/2015 (Approximate)     FHT:  FHR: 140 bpm, variability: moderate,  accelerations:  Present,  decelerations:  Absent UC:   Every 1-3.5 minutes  SVE:   Dilation: 2.5 Effacement (%): 70 Station: -2 Exam by:: Emily Rothermel RN  PROM 1/18 1000 light meconium per chart review   Labs: Lab Results  Component Value Date   WBC 10.1 10/22/2015   HGB 10.0* 10/22/2015   HCT 30.7* 10/22/2015   MCV 85.5 10/22/2015   PLT 160 10/22/2015    Assessment / Plan: 25 y.o. [id number] [redacted]w[redacted]d admitted for PROM. IOL with pitocin   Induction of labor due to PROM with pitocin   Labor: Induction of labor due to PROM with pitocin  Fetal Wellbeing:  Category I Pain Control:  Fentanyl currently; desires Epidural eventually  Anticipated MOD:  NSVD  Expectant management  [name], MD PGY 1 Family Medicine

## 2015-10-22 NOTE — H&P (Signed)
LABOR ADMISSION HISTORY AND PHYSICAL  Kristin Powers is a 25 y.o. female [id number] with IUP at [redacted]w[redacted]d by presents for labor admission.  Pt had LOF around 10am this morning, initially clear, then more yellow fluid soaking her pad. She reports +FM, infrequent contractions.  No VB, no blurry vision, headaches or peripheral edema, and RUQ pain.  She plans on breast feeding.  She request Depo for birth control.  Dating: By LMP --->  Estimated Date of Delivery: 10/19/15  Sono:    , CWD, normal anatomy, 500g   Prenatal History/Complications: Rh status - maternal blood type O-  GBS positive  Late to care  Past Medical History: Past Medical History  Diagnosis Date  . Pregnancy induced hypertension   . Depression   . Headache(784.0)   . Asthma   . Urinary tract infection   . Abnormal Pap smear   . Human papilloma virus   . Trichomonas   . Acid reflux   . Vaginal Pap smear, abnormal     Past Surgical History: Past Surgical History  Procedure Laterality Date  . Induced abortion  March 2013    Obstetrical History: OB History    Gravida Para Term Preterm AB TAB SAB Ectopic Multiple Living   0 1 1 0 0 0 2      Social History: Social History   Social History  . Marital Status: Single    Spouse Name: N/A  . Number of Children: N/A  . Years of Education: N/A   Social History Main Topics  . Smoking status: Former Smoker -- 0.50 packs/day for 2 years    Types: Cigarettes  . Smokeless tobacco: Never Used  . Alcohol Use: No  . Drug Use: No  . Sexual Activity: Yes    Birth Control/ Protection: None     Comment: plans depo at 6wk check up   Other Topics Concern  . Not on file   Social History Narrative    Family History: Family History  Problem Relation Age of Onset  . Anesthesia problems Neg Hx   . Hypotension Neg Hx   . Malignant hyperthermia Neg Hx   . Pseudochol deficiency Neg Hx   . Other Neg Hx   . Depression Mother   . Hypertension Mother   .  Diabetes Mother   . Hypertension Father     Allergies: No Known Allergies  Prescriptions prior to admission  Medication Sig Dispense Refill Last Dose  . cyclobenzaprine (FLEXERIL) 10 MG tablet Take 1 tablet (10 mg total) by mouth every 8 (eight) hours as needed for muscle spasms. (Patient not taking: Reported on 10/19/2015) 30 tablet 1 Taking  . oxyCODONE-acetaminophen (PERCOCET/ROXICET) 5-325 MG tablet Take 1 tablet by mouth every 4 (four) hours as needed for moderate pain. (Patient not taking: Reported on 10/19/2015) 15 tablet 0   . promethazine (PHENERGAN) 25 MG tablet Take 0.5-1 tablets (12.5-25 mg total) by mouth every 6 (six) hours as needed. (Patient not taking: Reported on 10/19/2015) 30 tablet 2 Taking  . zolpidem (AMBIEN) 5 MG tablet Take 1 tablet (5 mg total) by mouth at bedtime as needed for sleep. (Patient not taking: Reported on 10/19/2015) 5 tablet 0      Review of Systems   All systems reviewed and negative except as stated in HPI  LMP 01/12/2015 (Approximate) General appearance: alert, cooperative, appears stated age and no distress Lungs: clear to auscultation bilaterally Heart: regular rate and rhythm Abdomen: soft, non-tender; bowel sounds normal  Pelvic: adequate Extremities: Homans sign is negative, no sign of DVT, edema DTR's nl Presentation: cephalic Fetal monitoringBaseline: 130 bpm, Variability: Good {> 6 bpm) and Accelerations: Reactive Uterine activityFrequency: Every 10 minutes     Prenatal labs: ABO, Rh: O/Negative/-- (09/12 0000) Antibody: Negative (09/12 0000) Rubella: !Error!immune RPR: Nonreactive (09/12 0000)  HBsAg: Negative (09/12 0000)  HIV: Non-reactive (09/12 0000)  GBS:   positive 1 hr Glucola 101 Genetic screening  Late to care Anatomy US normal  Prenatal Transfer Tool  Maternal Diabetes: No Genetic Screening: late to [facility] Maternal Ultrasounds/Referrals: Normal Fetal Ultrasounds or other Referrals:  None Maternal Substance Abuse:   No Significant Maternal Medications:  None  Significant Maternal Lab Results: GBS +  No results found for this or any previous visit (from the past 24 hour(s)).  Patient Active Problem List   Diagnosis Date Noted  . Rh negative status during pregnancy, antepartum 10/16/2015  . Group B Streptococcus carrier, +RV culture, currently pregnant 09/24/2015  . Supervision of high risk pregnancy, antepartum 07/10/2015  . History of prior pregnancy with IUGR newborn 06/27/2015  . Asthma 06/27/2015  . Depression 06/27/2015  . Prior pregnancy complicated by [facility], antepartum 06/27/2015  . Obesity 06/27/2015  . Type o blood, rh negative 06/27/2015  . Positive urine drug screen 06/27/2015    Assessment: Kristin Powers is a 25 y.o. [id number] at [redacted]w[redacted]d here for PROM and latent labor.  Plan for admission for labor mgmt.   # Labor: PROM with latent labor -admit to L&D with routine orders  -may have to augment labor with cytotec or pitocin   #Rh negative status: mother blood type O- -plan to repeat Rhogam after delivery pending infant blood type   # Fetal Wellbeing: fetus well appearing, Category 1 -Continue monitoring   # Pain Control:  -analgesia prn -pt desires epidural  # GBS: positive -IV ampicillin   # Postpartum plan:  -Feeding: Breast -Contraception: Depo  [name], MD, PGY-1   OB fellow attestation:  I have seen and examined this patient; I agree with above documentation in the resident's note.   Kristin Powers is a 25 y.o. [phone] here for PROM without onset of contractions  PE: BP 120/73 mmHg  Pulse 109  Temp(Src) 98.3 F (36.8 C) (Oral)  Resp 16  Ht  (1.575 m)  Wt 194 lb (87.998 kg)  BMI 35.47 kg/m2  LMP 01/12/2015 (Approximate) Gen: calm comfortable, NAD Resp: normal effort, no distress Abd: gravid  ROS, labs, PMH reviewed  Plan: Labor: plan to augment with pitocin after ampicillin has been infused for 2-3 hours.  FWB: cat I currently ID: GBS  positive, started ampicillin will continue with PCN if still in labor in 6 hours MOF: Breast and Formula MOC: Depo  Federico Flake, MD  Family Medicine, OB Fellow 10/22/2015, 3:06 PM

## 2015-10-22 NOTE — Progress Notes (Signed)
Labor Progress Note Kristin Powers is a 25 y.o. [id number] at [redacted]w[redacted]d presented for PROM and labor mgmt.  S: Continues to leak some fluid, mild pain with ctx. Resting comfortably.   O:  BP 126/71 mmHg  Pulse 89  Temp(Src) 98.4 F (36.9 C) (Oral)  Resp 16  Ht  (1.575 m)  Wt 87.998 kg (194 lb)  BMI 35.47 kg/m2  LMP 01/12/2015 (Approximate)  Fetal well being: fetus well appearing, Category 1 FHR 140s Good variability  +accels/no decels  reactive   CVE: Dilation: 4 Effacement (%): 70 Station: -1 Presentation: Vertex (confirmed by ultrasound) Exam by:: Resident   A&P: 25 y.o. [id number] [redacted]w[redacted]d presented for PROM and labor mgmt.  #Labor: no change in cervical dilation since beginning cytotec, will begin IV pitocin infusion #Pain: prn meds, pt would like epidural #FWB: continue to monitor #GBS: positive - s/p ampicillin x1, PCN q4h   [name], MD 6:44 PM

## 2015-10-22 NOTE — MAU Note (Signed)
Leaking fluid, has towel wrapped around her - which is wet.  Fluid is still coming.  Pt brought back to use restroom

## 2015-10-23 ENCOUNTER — Observation Stay (HOSPITAL_COMMUNITY): Payer: Medicaid Other | Admitting: Anesthesiology

## 2015-10-23 ENCOUNTER — Encounter: Payer: Medicaid Other | Admitting: Family

## 2015-10-23 ENCOUNTER — Encounter (HOSPITAL_COMMUNITY): Payer: Self-pay | Admitting: Certified Nurse Midwife

## 2015-10-23 DIAGNOSIS — Z3490 Encounter for supervision of normal pregnancy, unspecified, unspecified trimester: Secondary | ICD-10-CM

## 2015-10-23 DIAGNOSIS — O26893 Other specified pregnancy related conditions, third trimester: Secondary | ICD-10-CM | POA: Diagnosis present

## 2015-10-23 DIAGNOSIS — Z8249 Family history of ischemic heart disease and other diseases of the circulatory system: Secondary | ICD-10-CM | POA: Diagnosis not present

## 2015-10-23 DIAGNOSIS — Z87891 Personal history of nicotine dependence: Secondary | ICD-10-CM | POA: Diagnosis not present

## 2015-10-23 DIAGNOSIS — F329 Major depressive disorder, single episode, unspecified: Secondary | ICD-10-CM | POA: Diagnosis present

## 2015-10-23 DIAGNOSIS — Z6791 Unspecified blood type, Rh negative: Secondary | ICD-10-CM | POA: Diagnosis not present

## 2015-10-23 DIAGNOSIS — O99344 Other mental disorders complicating childbirth: Secondary | ICD-10-CM | POA: Diagnosis present

## 2015-10-23 DIAGNOSIS — O360930 Maternal care for other rhesus isoimmunization, third trimester, not applicable or unspecified: Secondary | ICD-10-CM

## 2015-10-23 DIAGNOSIS — Z833 Family history of diabetes mellitus: Secondary | ICD-10-CM | POA: Diagnosis not present

## 2015-10-23 DIAGNOSIS — Z3A40 40 weeks gestation of pregnancy: Secondary | ICD-10-CM

## 2015-10-23 DIAGNOSIS — O4292 Full-term premature rupture of membranes, unspecified as to length of time between rupture and onset of labor: Secondary | ICD-10-CM | POA: Diagnosis present

## 2015-10-23 DIAGNOSIS — Z6835 Body mass index (BMI) 35.0-35.9, adult: Secondary | ICD-10-CM | POA: Diagnosis not present

## 2015-10-23 DIAGNOSIS — O99824 Streptococcus B carrier state complicating childbirth: Secondary | ICD-10-CM | POA: Diagnosis not present

## 2015-10-23 DIAGNOSIS — O99214 Obesity complicating childbirth: Secondary | ICD-10-CM | POA: Diagnosis present

## 2015-10-23 LAB — RPR: RPR Ser Ql: NONREACTIVE

## 2015-10-23 MED ORDER — ZOLPIDEM TARTRATE 5 MG PO TABS
5.0000 mg | ORAL_TABLET | Freq: Every evening | ORAL | Status: DC | PRN
Start: 2015-10-23 — End: 2015-10-25

## 2015-10-23 MED ORDER — PNEUMOCOCCAL VAC POLYVALENT 25 MCG/0.5ML IJ INJ
0.5000 mL | INJECTION | Freq: Once | INTRAMUSCULAR | Status: AC
  Administered 2015-10-23: 0.5 mL via INTRAMUSCULAR
  Filled 2015-10-23: qty 0.5

## 2015-10-23 MED ORDER — TETANUS-DIPHTH-ACELL PERTUSSIS 5-2.5-18.5 LF-MCG/0.5 IM SUSP
0.5000 mL | Freq: Once | INTRAMUSCULAR | Status: AC
  Administered 2015-10-24: 0.5 mL via INTRAMUSCULAR
  Filled 2015-10-23: qty 0.5

## 2015-10-23 MED ORDER — PHENYLEPHRINE 40 MCG/ML (10ML) SYRINGE FOR IV PUSH (FOR BLOOD PRESSURE SUPPORT): 80.0000 ug | PREFILLED_SYRINGE | INTRAVENOUS | Status: DC | PRN

## 2015-10-23 MED ORDER — EPHEDRINE 5 MG/ML INJ: 10.0000 mg | INTRAVENOUS | Status: DC | PRN

## 2015-10-23 MED ORDER — WITCH HAZEL-GLYCERIN EX PADS: 1.0000 "application " | MEDICATED_PAD | CUTANEOUS | Status: DC | PRN

## 2015-10-23 MED ORDER — LIDOCAINE HCL (PF) 1 % IJ SOLN
INTRAMUSCULAR | Status: DC | PRN
  Administered 2015-10-23 (×2): 5 mL

## 2015-10-23 MED ORDER — FENTANYL 2.5 MCG/ML BUPIVACAINE 1/10 % EPIDURAL INFUSION (WH - ANES)
INTRAMUSCULAR | Status: AC
  Administered 2015-10-23: 14 mL/h via EPIDURAL
  Filled 2015-10-23: qty 125

## 2015-10-23 MED ORDER — PRENATAL MULTIVITAMIN CH
1.0000 | ORAL_TABLET | Freq: Every day | ORAL | Status: DC
  Administered 2015-10-23 – 2015-10-25 (×3): 1 via ORAL
  Filled 2015-10-23 (×3): qty 1

## 2015-10-23 MED ORDER — DIBUCAINE 1 % RE OINT
1.0000 "application " | TOPICAL_OINTMENT | RECTAL | Status: DC | PRN
  Filled 2015-10-23: qty 28

## 2015-10-23 MED ORDER — IBUPROFEN 600 MG PO TABS
600.0000 mg | ORAL_TABLET | Freq: Four times a day (QID) | ORAL | Status: DC
  Administered 2015-10-23 – 2015-10-25 (×10): 600 mg via ORAL
  Filled 2015-10-23 (×10): qty 1

## 2015-10-23 MED ORDER — SIMETHICONE 80 MG PO CHEW: 80.0000 mg | CHEWABLE_TABLET | ORAL | Status: DC | PRN

## 2015-10-23 MED ORDER — FENTANYL 2.5 MCG/ML BUPIVACAINE 1/10 % EPIDURAL INFUSION (WH - ANES)
14.0000 mL/h | INTRAMUSCULAR | Status: DC | PRN
  Administered 2015-10-23: 14 mL/h via EPIDURAL

## 2015-10-23 MED ORDER — BENZOCAINE-MENTHOL 20-0.5 % EX AERO
1.0000 "application " | INHALATION_SPRAY | CUTANEOUS | Status: DC | PRN
  Administered 2015-10-23: 1 via TOPICAL
  Filled 2015-10-23 (×2): qty 56

## 2015-10-23 MED ORDER — ONDANSETRON HCL 4 MG/2ML IJ SOLN: 4.0000 mg | INTRAMUSCULAR | Status: DC | PRN

## 2015-10-23 MED ORDER — ACETAMINOPHEN 325 MG PO TABS
650.0000 mg | ORAL_TABLET | ORAL | Status: DC | PRN
  Administered 2015-10-23 – 2015-10-24 (×4): 650 mg via ORAL
  Filled 2015-10-23 (×4): qty 2

## 2015-10-23 MED ORDER — SENNOSIDES-DOCUSATE SODIUM 8.6-50 MG PO TABS
2.0000 | ORAL_TABLET | ORAL | Status: DC
  Administered 2015-10-23 – 2015-10-24 (×2): 2 via ORAL
  Filled 2015-10-23 (×2): qty 2

## 2015-10-23 MED ORDER — ONDANSETRON HCL 4 MG PO TABS: 4.0000 mg | ORAL_TABLET | ORAL | Status: DC | PRN

## 2015-10-23 MED ORDER — LANOLIN HYDROUS EX OINT: TOPICAL_OINTMENT | CUTANEOUS | Status: DC | PRN

## 2015-10-23 MED ORDER — DIPHENHYDRAMINE HCL 50 MG/ML IJ SOLN: 12.5000 mg | INTRAMUSCULAR | Status: DC | PRN

## 2015-10-23 MED ORDER — DIPHENHYDRAMINE HCL 25 MG PO CAPS: 25.0000 mg | ORAL_CAPSULE | Freq: Four times a day (QID) | ORAL | Status: DC | PRN

## 2015-10-23 MED ORDER — FENTANYL 2.5 MCG/ML BUPIVACAINE 1/10 % EPIDURAL INFUSION (WH - ANES)
INTRAMUSCULAR | Status: DC | PRN
  Administered 2015-10-23: 14 mL/h via EPIDURAL

## 2015-10-23 MED ORDER — OXYCODONE HCL 5 MG PO TABS
5.0000 mg | ORAL_TABLET | Freq: Once | ORAL | Status: AC
  Administered 2015-10-23: 5 mg via ORAL
  Filled 2015-10-23 (×2): qty 1

## 2015-10-23 MED ORDER — PHENYLEPHRINE 40 MCG/ML (10ML) SYRINGE FOR IV PUSH (FOR BLOOD PRESSURE SUPPORT)
PREFILLED_SYRINGE | INTRAVENOUS | Status: AC
  Filled 2015-10-23: qty 20

## 2015-10-23 MED ORDER — FENTANYL 2.5 MCG/ML BUPIVACAINE 1/10 % EPIDURAL INFUSION (WH - ANES)
14.0000 mL/h | INTRAMUSCULAR | Status: DC | PRN
Start: 2015-10-23 — End: 2015-10-23

## 2015-10-23 NOTE — Anesthesia Preprocedure Evaluation (Signed)
Anesthesia Evaluation  Patient identified by MRN, date of birth, ID band Patient awake and Patient confused    Reviewed: Allergy & Precautions, H&P , NPO status , Patient's Chart, lab work & pertinent test results  Airway Mallampati: II       Dental   Pulmonary former smoker,    Pulmonary exam normal breath sounds clear to auscultation       Cardiovascular Exercise Tolerance: Good hypertension, On Medications Normal cardiovascular exam Rhythm:regular Rate:Normal     Neuro/Psych    GI/Hepatic   Endo/Other  Morbid obesityBMI 35  Renal/GU      Musculoskeletal   Abdominal   Peds  Hematology   Anesthesia Other Findings   Reproductive/Obstetrics (+) Pregnancy PIH before, none with this PG                             Anesthesia Physical Anesthesia Plan  ASA: II  Anesthesia Plan: Epidural   Post-op Pain Management:    Induction:   Airway Management Planned:   Additional Equipment:   Intra-op Plan:   Post-operative Plan:   Informed Consent: I have reviewed the patients History and Physical, chart, labs and discussed the procedure including the risks, benefits and alternatives for the proposed anesthesia with the patient or authorized representative who has indicated his/her understanding and acceptance.     Plan Discussed with:   Anesthesia Plan Comments:         Anesthesia Quick Evaluation

## 2015-10-23 NOTE — Addendum Note (Signed)
Addendum  created 10/23/15 1619 by Armanda Heritage, CRNA   Modules edited: Clinical Notes   Clinical Notes:  File: [id number]

## 2015-10-23 NOTE — Progress Notes (Signed)
Labor Progress Note  Kristin Powers is a 25 y.o. [id number] at [redacted]w[redacted]d  admitted for PROM 1/18 1000  S:  Received a call from nursing about patient needing to push. On Evaluation, patient states that she no longer feels the pressure to push. Has epidural for pain.   O:  BP 126/87 mmHg  Pulse 91  Temp(Src) 98.1 F (36.7 C) (Oral)  Resp 18  Ht  (1.575 m)  Wt 87.998 kg (194 lb)  BMI 35.47 kg/m2  LMP 01/12/2015 (Approximate)     FHT:  FHR: 140 bpm, variability: moderate,  accelerations:  Present,  decelerations:  Present few variable, one late decel around 0612 UC:  Every 2-3.5 SVE:   Dilation: 10 Effacement (%): 100 Station: 0 Exam by:: K.Shaw CNM SROM  Pitocin @ 50ml/hr  Labs: Lab Results  Component Value Date   WBC 10.1 10/22/2015   HGB 10.0* 10/22/2015   HCT 30.7* 10/22/2015   MCV 85.5 10/22/2015   PLT 160 10/22/2015    Assessment / Plan: 25 y.o. [id number] [redacted]w[redacted]d active labor  Augmentation of labor, progressing well. Complete but 0 station. Trial pushing without much change/advancement. Will continue to labor.    Labor: Progressing normally on Pitocin Fetal Wellbeing:  Category II variables; one late decel 0612 Pain Control:  Epidural Anticipated MOD:  NSVD  Expectant management   [name], MD PGY 1 Family Medicine

## 2015-10-23 NOTE — Anesthesia Postprocedure Evaluation (Signed)
Anesthesia Post Note  Patient: Kristin Powers  Procedure(s) Performed: * No procedures listed *  Patient location during evaluation: Mother Baby Level of consciousness: awake and alert and patient cooperative Pain management: pain level not controlled Vital Signs Assessment: post-procedure vital signs reviewed and stable Respiratory status: spontaneous breathing Cardiovascular status: blood pressure returned to baseline Postop Assessment: no headache, no signs of nausea or vomiting, patient able to bend at knees, adequate PO intake and epidural receding Anesthetic complications: no    Last Vitals:  Filed Vitals:   10/23/15 1015 10/23/15 1311  BP: 117/65 116/64  Pulse: 102 91  Temp: 37.3 C 37.1 C  Resp: 16 18    Last Pain:  Filed Vitals:   10/23/15 1314  PainSc: 7                  Andrw Mcguirt, [employer]

## 2015-10-23 NOTE — Lactation Note (Signed)
This note was copied from the chart of Kristin Powers. Lactation Consultation Note  Patient Name: Kristin Powers VHQIO'N Date: 10/23/2015 Reason for consult: Initial assessment Mom did not have success BF her older children, pump/bottle for short time. LC not sure of Mom's commitment with this baby.  Mom has given bottle the last 2 feedings. Mom reports she has no milk. Basic teaching reviewed with Mom. Tummy sizes discussed, feeding patterns in 1st 24 hours reviewed.  Baby asleep at this visit, Mom did not want to attempt latch. Encouraged Mom to BF with each feeding before giving any bottles to stimulate milk production. Encouraged to BF with feeding ques. Hand pump given per Mom's request, flange changed to size 27 for better fit. Discussed with Mom that if she did not want to latch she could pump/bottle feed. Advised we would set her up with DEBP if she decides to pump/bottle. Mom will advise. Lactation brochure left for review, advised of OP services and support group. Encouraged to call for latch assist while in hospital.   Maternal Data Has patient been taught Hand Expression?: Yes Does the patient have breastfeeding experience prior to this delivery?: Yes  Feeding Feeding Type: Bottle Fed - Formula  LATCH Score/Interventions                      Lactation Tools Discussed/Used     Consult Status Consult Status: Follow-up Date: 10/24/15 Follow-up type: In-patient    Alfred Levins 10/23/2015, 3:14 PM

## 2015-10-23 NOTE — Anesthesia Procedure Notes (Signed)
Epidural Patient location during procedure: OB Start time: 10/23/2015 2:55 AM End time: 10/23/2015 3:13 AM  Staffing Anesthesiologist: Sebastian Ache Performed by: anesthesiologist   Preanesthetic Checklist Completed: patient identified, site marked, surgical consent, pre-op evaluation, timeout performed, IV checked, risks and benefits discussed and monitors and equipment checked  Epidural Patient position: sitting Prep: site prepped and draped and DuraPrep Patient monitoring: heart rate, continuous pulse ox and blood pressure Approach: midline Location: L3-L4 Injection technique: LOR air  Needle:  Needle type: Tuohy  Needle gauge: 17 G Needle length: 9 cm and 9 Needle insertion depth: 5.5 cm Catheter type: closed end flexible Catheter size: 19 Gauge Catheter at skin depth: 14 cm Test dose: negative  Assessment Events: blood not aspirated, injection not painful, no injection resistance, negative IV test and no paresthesia  Additional Notes   Patient tolerated the insertion well without complications.Reason for block:procedure for pain

## 2015-10-24 MED ORDER — SERTRALINE HCL 50 MG PO TABS
50.0000 mg | ORAL_TABLET | Freq: Every day | ORAL | Status: DC
  Administered 2015-10-24 – 2015-10-25 (×2): 50 mg via ORAL
  Filled 2015-10-24 (×3): qty 1

## 2015-10-24 MED ORDER — HYDROCODONE-ACETAMINOPHEN 5-325 MG PO TABS
1.0000 | ORAL_TABLET | Freq: Four times a day (QID) | ORAL | Status: DC | PRN
  Administered 2015-10-24: 2 via ORAL
  Administered 2015-10-24: 1 via ORAL
  Administered 2015-10-24: 2 via ORAL
  Filled 2015-10-24 (×3): qty 2

## 2015-10-24 NOTE — Clinical Social Work Maternal (Addendum)
CLINICAL SOCIAL WORK MATERNAL/CHILD NOTE  Patient Details  Name: Kristin Powers MRN: [id number] Date of Birth: [birth year]  Date:  10/24/2015  Clinical Social Worker Initiating Note:  Loleta Books MSW, LCSW Date/ Time Initiated:  10/24/15/0940    Child's Name:  Kristin Powers   Legal Guardian:  Tresa Garter and Charolotte Eke   Need for Interpreter:  None   Date of Referral:  10/23/15     Reason for Referral:  Current Substance Use/Substance Use During Pregnancy [facility]), History of depression   Referral Source:  [facility]   Address:  [address] [place], [place] [address]  Phone number:  [phone]   Household Members:  Minor Children, Self [name] 04/07/11 and Juanetta Snow 04/05/10)  Natural Supports (not living in the home): FOB, mother  Professional Supports: None   Employment: Full-time   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 Substance Use: [name] presents with THC use during pregnancy (+UDS on 9/12). Infant's UDS is positive and cord tissue is pending. 2. Mental Health Concerns: [name] presents with history of depression since childhood and a history of postpartum depression after her first two children. She expressed interest in re-starting Zoloft prior to discharge.    Cognitive State:  Able to Concentrate , Alert , Goal Oriented    Mood/Affect:  Euthymic , Calm    CSW Assessment:  CSW received request for consult due to [name] presenting with a history of depression and marijuana use.  [name] presented as easily engaged and receptive to the visit. The FOB was also present in the room, but was sleeping loudly.  [name] was observed to be interacting and caring for the  infant.  She displayed a limited range in affect, which was congruent with [name]'s reported mood of feeling "down" and slightly depressed.   [name] required minimal time and effort prior to [name] beginning to discuss her mental health.  CSW introduced self and reason for the visit, and [name] stated that she has already noted feeling "off".  [name] reported history of depression since childhood, and shared that she has previously participated in therapy and medication management. She shard that she had "bad" postpartum depression after her previous children were born (2011 and 2012), and shared belief that symptoms were triggered by being a first time mother, minimal support from their fathers, and short interval between pregnancies.  [name] expressed hope that it will be a better transition postpartum since she knows what to anticipate and expect and has the support from this FOB, but shared that she is already noting that she feeling "down".  [name] expressed desire to "be on something", and clarified interest in an antidepressant.  She stated that she has previously been prescribed Zoloft in the postpartum period and found it helpful. [name] denied acute depressive symptoms during the pregnancy. [name] expressed appreciation for CSW offer to discuss her request with her OB.  [name] presents with limited insight about emotional regulation skills that assist her to cope with numerous stressors as she was unable to identify what has helped her to balance parenthood while working.  [name] shared that she is feeling badly about her current physical appearance.  [name] reported that she has previously worked hard to control her weight, and does not like how she looks right now.  [name] was receptive to exploring her past strategies to assist her with weight loss and improving physical health after her children were born.  [name] recognized her ability to utilize same strategies and to build on her past successes, and was receptive to exploring how to  view the situation as a short term situation in order to feel better.   [name]'s mood and affect changed when CSW began to discuss substance use history.  [name] asked why CSW was inquiring about use during pregnancy.  CSW informed [name] of hospital drug screen policy due to [facility] presenting with a +UDS on 9/12.  [name] verbalized understanding, but was vague and guarded about her exact use.  [name] stated that she felt nauseous, and felt that marijuana was the only intervention that would work.  [name] expressed regret for her decision since she had been informed by friends about the hospital drug screen policy.  CSW informed [name] of infant's +UDS for [facility] and the subsequent CPS report.  [name] denied prior involvement with CPS, but verbalized understanding of the process. [name] shared that she is not concerned about the report being made since she knows that she is a good parent.  [name] denied additional questions or concerns at this time, but maintained a flattened affect at the conclusion of the assessment.   [name] agreed to contact CSW if additional needs arise.   CSW Plan/Description:   1. Patient/Family Education-- Hospital drug screen policy, perinatal mood disorders 2. Child Protective Service Report-- [facility] report made due to infant's +UDS for [facility]. 3. CSW to monitor infant's cord tissue screen, and will notify CPS of results. 4. CSW to consult with OB provider per [name]'s request for medication evaluation. [name] expressed desire to be proactive and start Zoloft prior to discharge due to previous history of "bad" postpartum depression.  5.  No Further Intervention Required/No Barriers to Discharge    Pervis Hocking, LCSW 10/24/2015, 12:06 PM

## 2015-10-24 NOTE — Progress Notes (Signed)
Post Partum Day 1 Subjective: no complaints, up ad lib, voiding and tolerating PO, small lochia, plans to bottle feed, Requests narcotics for cramping, tailbone pain. plans Depo-Provera  Objective: Blood pressure 120/65, pulse 90, temperature 98.2 F (36.8 C), temperature source Oral, resp. rate 18, height  (1.575 m), weight 87.998 kg (194 lb), last menstrual period 01/12/2015, SpO2 100 %, unknown if currently breastfeeding.  Physical Exam:  General: alert, cooperative and no distress Lochia:normal flow Chest: CTAB Heart: RRR no m/r/g Abdomen: +BS, soft, nontender,  Uterine Fundus: firm DVT Evaluation: No evidence of DVT seen on physical exam. Extremities: trace edema   Recent Labs  10/22/15 1335  HGB 10.0*  HCT 30.7*    Assessment/Plan: Plan for discharge tomorrow  Rx Hydrocodone. Pt aware she won't be getting a rx for home use   LOS: 2 days   Powers,Kristin Reza 10/24/2015, 7:46 AM

## 2015-10-24 NOTE — Progress Notes (Signed)
UR chart review completed.  

## 2015-10-24 NOTE — Progress Notes (Signed)
Post Partum Day 1 Subjective: no complaints, voiding, tolerating PO and + flatus  Objective: Blood pressure 120/65, pulse 90, temperature 98.2 F (36.8 C), temperature source Oral, resp. rate 18, height  (1.575 m), weight 87.998 kg (194 lb), last menstrual period 01/12/2015, SpO2 100 %, unknown if currently breastfeeding.  Physical Exam:  General: alert, cooperative and no distress Lochia: appropriate Uterine Fundus: unable to feel  Incision: NA DVT Evaluation: no apparent swelling. No pain on evaluation.   Recent Labs  10/22/15 1335  HGB 10.0*  HCT 30.7*    Assessment/Plan: GB, mother positive for marijuana. Baby positive for marijuana and high bilirubin.  Consider possible discharge tomorrow   LOS: 2 days   Ivar Drape 10/24/2015, 7:53 AM   I have seen and examined this patient and agree the above assessment.  Respiratory effort normal, lochia appropriate, legs negative,  pain level normal.  Powers,Kristin Mellott 10/28/2015 3:35 PM

## 2015-10-25 MED ORDER — SERTRALINE HCL 50 MG PO TABS: 50.0000 mg | ORAL_TABLET | Freq: Every day | ORAL | Status: DC

## 2015-10-25 MED ORDER — IBUPROFEN 600 MG PO TABS: 600.0000 mg | ORAL_TABLET | Freq: Four times a day (QID) | ORAL | Status: DC

## 2015-10-25 NOTE — Discharge Summary (Signed)
OB Discharge Summary     Patient Name: Kristin Powers DOB: 09/03/1991 MRN: [id number]  Date of admission: 10/22/2015 Delivering MD: Cam Hai D   Date of discharge: 10/25/2015  Admitting diagnosis: WATER BROKE Intrauterine pregnancy: [redacted]w[redacted]d     Secondary diagnosis:  Active Problems:   Premature rupture of membranes (PROM) at term with onset of labor after 24 hours, antepartum   Pregnant  Additional problems:  Rh status - maternal blood type O-  GBS positive  Late to care THC+     Discharge diagnosis: Term Pregnancy Delivered                                                                                                Post partum procedures: none   Augmentation: Pitocin  Complications: None  Hospital course:  Onset of Labor With Vaginal Delivery     25 y.o. yo [id number] at [redacted]w[redacted]d was admitted in Latent Labor on 10/22/2015. Patient had an uncomplicated labor course as follows:  Membrane Rupture Time/Date: 11:00 AM ,10/22/2015   Intrapartum Procedures: Episiotomy: None [1]                                         Lacerations:  None [1]  Patient had a delivery of a Viable infant. 10/23/2015  Information for the patient's newborn:  Ji, Fairburn [[id number]]  Delivery Method: Vaginal, Spontaneous Delivery (Filed from Delivery Summary)    Patient was started on Zoloft on day of discharge for post-partum depression.  Otherwise pt had an uncomplicated postpartum course.  She is ambulating, tolerating a regular diet, passing flatus, and urinating well. Patient is discharged home in stable condition on 10/25/2015.    Physical exam  Filed Vitals:   10/23/15 1838 10/24/15 0641 10/24/15 1823 10/25/15 0700  BP: 108/79 120/65 135/81 123/73  Pulse: 98 90 81 80  Temp: 98.4 F (36.9 C) 98.2 F (36.8 C)  98.1 F (36.7 C)  TempSrc: Oral Oral    Resp: Height:      Weight:      SpO2:       General: alert, cooperative and no distress Lochia:  appropriate Uterine Fundus: firm Incision: N/A DVT Evaluation: No evidence of DVT seen on physical exam. Labs: Lab Results  Component Value Date   WBC 10.1 10/22/2015   HGB 10.0* 10/22/2015   HCT 30.7* 10/22/2015   MCV 85.5 10/22/2015   PLT 160 10/22/2015   CMP Latest Ref Rng 07/21/2015  Glucose 65 - 99 mg/dL 79  BUN 7 - 25 mg/dL 4(L)  Creatinine 1.30 - 1.10 mg/dL 8.65(H)  Sodium 846 - 962 mmol/L 137  Potassium 3.5 - 5.3 mmol/L 3.6  Chloride 98 - 110 mmol/L 105  CO2 20 - 31 mmol/L 21  Calcium 8.6 - 10.2 mg/dL 9.5(M)  Total Protein 6.1 - 8.1 g/dL 6.1  Total Bilirubin 0.2 - 1.2 mg/dL 0.2  Alkaline Phos 33 - 115 U/L 63  AST 10 - 30 U/L  23  ALT 6 - 29 U/L 14    Discharge instruction: per After Visit Summary and "Baby and Me Booklet".  After visit meds:    Medication List    ASK your doctor about these medications        cyclobenzaprine 10 MG tablet  Commonly known as:  FLEXERIL  Take 1 tablet (10 mg total) by mouth every 8 (eight) hours as needed for muscle spasms.     oxyCODONE-acetaminophen 5-325 MG tablet  Commonly known as:  PERCOCET/ROXICET  Take 1 tablet by mouth every 4 (four) hours as needed for moderate pain.     promethazine 25 MG tablet  Commonly known as:  PHENERGAN  Take 0.5-1 tablets (12.5-25 mg total) by mouth every 6 (six) hours as needed.     zolpidem 5 MG tablet  Commonly known as:  AMBIEN  Take 1 tablet (5 mg total) by mouth at bedtime as needed for sleep.        Diet: routine diet  Activity: Advance as tolerated. Pelvic rest for 6 weeks.   Outpatient follow up:2 weeks Follow up Appt:Future Appointments Date Time Provider Department Center  12/04/2015 2:20 PM Aviva Signs, CNM [facility]   Follow up Visit:No Follow-up on file.  Postpartum contraception: Depo Provera  Newborn Data: Live born female  Birth Weight: 7 lb 10.1 oz (3460 g) APGAR: 6, 8  Baby Feeding: Bottle and Breast Disposition:home with  mother   10/25/2015 [name], MD   CNM attestation I have seen and examined this patient and agree with above documentation in the resident's note.   Kristin Powers is a 25 y.o. [phone] s/p SVD.   Pain is well controlled.  Plan for birth control is Depo-Provera.  Method of Feeding: both  PE:  BP 135/88 mmHg  Pulse 98  Temp(Src) 98.3 F (36.8 C) (Oral)  Resp 20  Ht  (1.575 m)  Wt 87.998 kg (194 lb)  BMI 35.47 kg/m2  SpO2 100%  LMP 01/12/2015 (Approximate)  Breastfeeding? Unknown Fundus firm  No results for input(s): HGB, HCT in the last 72 hours.   Plan: discharge today - postpartum care discussed - f/u clinic in 2 weeks for mental health check up due to depression (taking Zoloft)   Kristin Powers, CNM 12:00 AM

## 2015-10-25 NOTE — Discharge Instructions (Signed)

## 2015-10-26 ENCOUNTER — Inpatient Hospital Stay (HOSPITAL_COMMUNITY): Admission: RE | Admit: 2015-10-26 | Payer: Medicaid Other | Source: Ambulatory Visit

## 2015-10-26 LAB — TYPE AND SCREEN
ABO/RH(D): O NEG
Antibody Screen: POSITIVE
DAT, IGG: NEGATIVE
Unit division: 0
Unit division: 0

## 2015-12-04 ENCOUNTER — Ambulatory Visit: Payer: Medicaid Other | Admitting: Advanced Practice Midwife

## 2015-12-10 ENCOUNTER — Ambulatory Visit: Payer: Medicaid Other | Admitting: Obstetrics and Gynecology

## 2015-12-11 ENCOUNTER — Ambulatory Visit: Payer: Self-pay | Admitting: Certified Nurse Midwife

## 2016-01-08 ENCOUNTER — Ambulatory Visit: Payer: Medicaid Other | Admitting: Certified Nurse Midwife

## 2016-01-17 ENCOUNTER — Encounter (HOSPITAL_COMMUNITY): Payer: Self-pay | Admitting: *Deleted

## 2016-01-17 ENCOUNTER — Inpatient Hospital Stay (HOSPITAL_COMMUNITY): Payer: Medicaid Other

## 2016-01-17 ENCOUNTER — Inpatient Hospital Stay (HOSPITAL_COMMUNITY)
Admission: AD | Admit: 2016-01-17 | Discharge: 2016-01-17 | Disposition: A | Discharge status: Home / Self Care | Payer: Medicaid Other | Source: Ambulatory Visit | Attending: Family Medicine | Admitting: Family Medicine

## 2016-01-17 DIAGNOSIS — R102 Pelvic and perineal pain: Secondary | ICD-10-CM

## 2016-01-17 DIAGNOSIS — R109 Unspecified abdominal pain: Secondary | ICD-10-CM

## 2016-01-17 DIAGNOSIS — A499 Bacterial infection, unspecified: Secondary | ICD-10-CM

## 2016-01-17 DIAGNOSIS — N760 Acute vaginitis: Secondary | ICD-10-CM | POA: Insufficient documentation

## 2016-01-17 DIAGNOSIS — Z87891 Personal history of nicotine dependence: Secondary | ICD-10-CM | POA: Insufficient documentation

## 2016-01-17 DIAGNOSIS — F329 Major depressive disorder, single episode, unspecified: Secondary | ICD-10-CM | POA: Diagnosis not present

## 2016-01-17 DIAGNOSIS — B9689 Other specified bacterial agents as the cause of diseases classified elsewhere: Secondary | ICD-10-CM

## 2016-01-17 LAB — URINALYSIS, ROUTINE W REFLEX MICROSCOPIC
BILIRUBIN URINE: NEGATIVE
GLUCOSE, UA: NEGATIVE mg/dL
KETONES UR: NEGATIVE mg/dL
Leukocytes, UA: NEGATIVE
NITRITE: NEGATIVE
PH: 6 (ref 5.0–8.0)
Protein, ur: NEGATIVE mg/dL
Specific Gravity, Urine: >1.03 — ABNORMAL HIGH (ref 1.005–1.030)

## 2016-01-17 LAB — WET PREP, GENITAL
Sperm: NONE SEEN
TRICH WET PREP: NONE SEEN
YEAST WET PREP: NONE SEEN

## 2016-01-17 LAB — URINE MICROSCOPIC-ADD ON

## 2016-01-17 LAB — POCT PREGNANCY, URINE: Preg Test, Ur: NEGATIVE

## 2016-01-17 MED ORDER — METRONIDAZOLE 0.75 % VA GEL: 1.0000 | Freq: Every day | VAGINAL | Status: DC

## 2016-01-17 MED ORDER — IBUPROFEN 800 MG PO TABS: 800.0000 mg | ORAL_TABLET | Freq: Three times a day (TID) | ORAL | Status: DC

## 2016-01-17 MED ORDER — KETOROLAC TROMETHAMINE 60 MG/2ML IM SOLN
60.0000 mg | Freq: Once | INTRAMUSCULAR | Status: AC
  Administered 2016-01-17: 60 mg via INTRAMUSCULAR
  Filled 2016-01-17: qty 2

## 2016-01-17 MED ORDER — TRAMADOL HCL 50 MG PO TABS: 50.0000 mg | ORAL_TABLET | Freq: Four times a day (QID) | ORAL | Status: DC | PRN

## 2016-01-17 NOTE — Discharge Instructions (Signed)

## 2016-01-17 NOTE — MAU Note (Signed)
Pt states that she had a "squeezing" lower, left abdominal pain this morning at 0600.  States tried to sleep off pain, but pain has become increasingly worse, now a 9/10.  Pt also states has been spotting since 0600 this morning.  Pt states menstrual cycles are irregular since having baby in January.

## 2016-01-17 NOTE — MAU Provider Note (Signed)
History   [id number]   Chief Complaint  Patient presents with  . Abdominal Pain    HPI CLARITY Kristin Powers is a 25 y.o. female  [phone] here with lower left sided pelvic pain that started at 0600 this morning.  Pain is intermittent and has gotten progressively worse.  Pain is rated a 9/10.  Also spotting intermittently since having a baby in January.   Discharge is described as copious pink.  No report of odor.  Pain increases with coughing.  Also concerned regarding abnormal uterine bleeding.  Spotting every 2-3 weeks.  Desires depo-provera for birth control.  Last unprotected intercourse last night.  Unable to go to postpartum visit due to child visitor restriction.    She denies dysuria, denies a diet high in fried foods, or fast foods. She denies fever.   Patient's last menstrual period was 01/01/2016 (approximate).  OB History  Gravida Para Term Preterm AB SAB TAB Ectopic Multiple Living  0 1 0 1 0 0 3    # Outcome Date GA Lbr Len/2nd Weight Sex Delivery Anes PTL Lv  4 Term 10/23/15 [redacted]w[redacted]d 12:15 / 00:43 7 lb 10.1 oz (3.46 kg) F Vag-Spont EPI  Y  3 Term 04/07/11 [redacted]w[redacted]d  6 lb 2 oz (2.778 kg) F Vag-Spont   Y     Comments: induced measuring small  2 Term 04/05/10 [redacted]w[redacted]d  4 lb (1.814 kg) M Vag-Spont EPI Y Y     Comments: induced PIH at 36wks  1 TAB 10/04/01             Comments: System Generated. Please review and update pregnancy details.      Past Medical History  Diagnosis Date  . Pregnancy induced hypertension   . Depression   . Headache(784.0)   . Asthma   . Urinary tract infection   . Abnormal Pap smear   . Human papilloma virus   . Trichomonas   . Acid reflux   . Vaginal Pap smear, abnormal     Family History  Problem Relation Age of Onset  . Anesthesia problems Neg Hx   . Hypotension Neg Hx   . Malignant hyperthermia Neg Hx   . Pseudochol deficiency Neg Hx   . Other Neg Hx   . Depression Mother   . Hypertension Mother   . Diabetes Mother   . Hypertension  Father     Social History   Social History  . Marital Status: Single    Spouse Name: N/A  . Number of Children: N/A  . Years of Education: N/A   Social History Main Topics  . Smoking status: Former Smoker -- 0.50 packs/day for 2 years    Types: Cigarettes  . Smokeless tobacco: Never Used  . Alcohol Use: No  . Drug Use: No  . Sexual Activity: Yes    Birth Control/ Protection: None   Other Topics Concern  . None   Social History Narrative    No Known Allergies  No current facility-administered medications on file prior to encounter.   Current Outpatient Prescriptions on File Prior to Encounter  Medication Sig Dispense Refill  . ibuprofen (ADVIL,MOTRIN) 600 MG tablet Take 1 tablet (600 mg total) by mouth every 6 (six) hours. (Patient not taking: Reported on 01/17/2016) 30 tablet 0  . sertraline (ZOLOFT) 50 MG tablet Take 1 tablet (50 mg total) by mouth daily. Take 1 tablet daily for the first week. Then increase to 2 tablets ( ) daily. (Patient not taking:  Reported on 01/17/2016) 60 tablet 3     Review of Systems  Constitutional: Negative for fever and chills.  Gastrointestinal: Negative for nausea and vomiting.  Genitourinary: Positive for vaginal bleeding, vaginal discharge and pelvic pain. Negative for dysuria, hematuria and flank pain.  All other systems reviewed and are negative.    Physical Exam   Filed Vitals:   01/17/16 1750 01/17/16 1755  BP:  134/68  Pulse:  86  Temp: 98.9 F (37.2 C)   TempSrc: Oral   Resp: 18   Height: 5' 2.5" (1.588 m)     Physical Exam  Constitutional: She is oriented to person, place, and time. She appears well-developed and well-nourished. No distress.  HENT:  Head: Normocephalic.  Neck: Normal range of motion. Neck supple.  Cardiovascular: Normal rate, regular rhythm and normal heart sounds.   Respiratory: Effort normal and breath sounds normal. No respiratory distress.  GI: Soft. She exhibits no mass. There is no  tenderness. There is no rebound, no guarding and no CVA tenderness.  Genitourinary: Cervix exhibits no motion tenderness and no discharge. Vaginal discharge (white, thin, pink-tinged discharge; malodorous ) found.  Musculoskeletal: Normal range of motion. She exhibits no edema.  Neurological: She is alert and oriented to person, place, and time.  Skin: Skin is warm and dry.  Psychiatric: She has a normal mood and affect.    MAU Course  Procedures  MDM Toradol 60 mg IM  Results for orders placed or performed during the hospital encounter of 01/17/16 (from the past 24 hour(s))  Urinalysis, Routine w reflex microscopic ([facility])     Status: Abnormal   Collection Time: 01/17/16  5:35 PM  Result Value Ref Range   Color, Urine YELLOW YELLOW   APPearance HAZY (A) CLEAR   Specific Gravity, Urine >1.030 (H) 1.005 - 1.030   pH 6.0 5.0 - 8.0   Glucose, UA NEGATIVE NEGATIVE mg/dL   Hgb urine dipstick TRACE (A) NEGATIVE   Bilirubin Urine NEGATIVE NEGATIVE   Ketones, ur NEGATIVE NEGATIVE mg/dL   Protein, ur NEGATIVE NEGATIVE mg/dL   Nitrite NEGATIVE NEGATIVE   Leukocytes, UA NEGATIVE NEGATIVE  Urine microscopic-add on     Status: Abnormal   Collection Time: 01/17/16  5:35 PM  Result Value Ref Range   Squamous Epithelial / LPF 6-30 (A) NONE SEEN   WBC, UA 0-5 0 - 5 WBC/hpf   RBC / HPF 0-5 0 - 5 RBC/hpf   Bacteria, UA MANY (A) NONE SEEN   Urine-Other MUCOUS PRESENT   Pregnancy, urine POC     Status: None   Collection Time: 01/17/16  5:42 PM  Result Value Ref Range   Preg Test, Ur NEGATIVE NEGATIVE  Wet prep, genital     Status: Abnormal   Collection Time: 01/17/16  6:30 PM  Result Value Ref Range   Yeast Wet Prep HPF POC NONE SEEN NONE SEEN   Trich, Wet Prep NONE SEEN NONE SEEN   Clue Cells Wet Prep HPF POC PRESENT (A) NONE SEEN   WBC, Wet Prep HPF POC MODERATE (A) NONE SEEN   Sperm NONE SEEN    [place] Transvaginal Non-ob  01/17/2016  CLINICAL DATA:  Patient with left-sided pelvic  pain since this morning. Intermittent vaginal bleeding. EXAM: TRANSABDOMINAL AND TRANSVAGINAL ULTRASOUND OF PELVIS TECHNIQUE: Both transabdominal and transvaginal ultrasound examinations of the pelvis were performed. Transabdominal technique was performed for global imaging of the pelvis including uterus, ovaries, adnexal regions, and pelvic cul-de-sac. It was necessary to  proceed with endovaginal exam following the transabdominal exam to visualize the adnexal structures. COMPARISON:  None FINDINGS: Uterus Measurements: 10.1 x 5.3 x 6.0 cm. No fibroids or other mass visualized. Endometrium Thickness: 5 mm.  No focal abnormality visualized. Right ovary Measurements: 2.6 x 2.0 x 1.9 cm. Normal appearance/no adnexal mass. Left ovary Measurements: 4.1 x 3.5 x 3.3 cm. Normal appearance/no adnexal mass. Other findings No abnormal free fluid. IMPRESSION: Unremarkable pelvic ultrasound. Endometrium measures 5 mm. If bleeding remains unresponsive to hormonal or medical therapy, sonohysterogram should be considered for focal lesion work-up. (Ref: Radiological Reasoning: Algorithmic Workup of Abnormal Vaginal Bleeding with Endovaginal Sonography and Sonohysterography. AJR 2008; 045:W09-81) Electronically Signed   By: Annia Belt M.D.   On: 01/17/2016 19:49   US Pelvis Complete  01/17/2016  CLINICAL DATA:  Patient with left-sided pelvic pain since this morning. Intermittent vaginal bleeding. EXAM: TRANSABDOMINAL AND TRANSVAGINAL ULTRASOUND OF PELVIS TECHNIQUE: Both transabdominal and transvaginal ultrasound examinations of the pelvis were performed. Transabdominal technique was performed for global imaging of the pelvis including uterus, ovaries, adnexal regions, and pelvic cul-de-sac. It was necessary to proceed with endovaginal exam following the transabdominal exam to visualize the adnexal structures. COMPARISON:  None FINDINGS: Uterus Measurements: 10.1 x 5.3 x 6.0 cm. No fibroids or other mass visualized. Endometrium  Thickness: 5 mm.  No focal abnormality visualized. Right ovary Measurements: 2.6 x 2.0 x 1.9 cm. Normal appearance/no adnexal mass. Left ovary Measurements: 4.1 x 3.5 x 3.3 cm. Normal appearance/no adnexal mass. Other findings No abnormal free fluid. IMPRESSION: Unremarkable pelvic ultrasound. Endometrium measures 5 mm. If bleeding remains unresponsive to hormonal or medical therapy, sonohysterogram should be considered for focal lesion work-up. (Ref: Radiological Reasoning: Algorithmic Workup of Abnormal Vaginal Bleeding with Endovaginal Sonography and Sonohysterography. AJR 2008; 191:Y78-29) Electronically Signed   By: Annia Belt M.D.   On: 01/17/2016 19:49    Report given to J. Jatoria Kneeland who assumes care of patient  Marlis Edelson, CNM 01/17/2016 8:00 PM   Discussed Pelvic US findings with the patient; patient sitting comfortably in the bed on her cell phone when I presented to the room to discuss.  Patient again denies constipation or diarrhea, patient without urinary complaints. Patient requesting something other than Ibuprofen for pain. She states that the toradol helped some, however is concerned that it will not work well at home. I discussed with the patient that I would give her #5 of ultram and encouraged her to seek care at [facility] ED if symptoms worsened.   Assessment and Plan   A:  1. Bacterial vaginosis   2. Pelvic pain in female   3. Abdominal pain of unknown etiology     P:  Discharge home in stable condition RX: Metro gel, ibuprofen, ultram Return to MAU for GYN concerns, for emergencies only Follow up with PCP  Duane Lope, NP 01/17/2016 11:34 PM

## 2016-01-20 LAB — GC/CHLAMYDIA PROBE AMP (~~LOC~~) NOT AT ARMC
CHLAMYDIA, DNA PROBE: NEGATIVE
NEISSERIA GONORRHEA: NEGATIVE

## 2016-04-06 ENCOUNTER — Inpatient Hospital Stay (HOSPITAL_COMMUNITY)
Admission: AD | Admit: 2016-04-06 | Discharge: 2016-04-06 | Disposition: A | Discharge status: Home / Self Care | Payer: Medicaid Other | Source: Ambulatory Visit | Attending: Obstetrics and Gynecology | Admitting: Obstetrics and Gynecology

## 2016-04-06 ENCOUNTER — Encounter (HOSPITAL_COMMUNITY): Payer: Self-pay | Admitting: *Deleted

## 2016-04-06 DIAGNOSIS — B379 Candidiasis, unspecified: Secondary | ICD-10-CM | POA: Insufficient documentation

## 2016-04-06 DIAGNOSIS — R1011 Right upper quadrant pain: Secondary | ICD-10-CM

## 2016-04-06 DIAGNOSIS — D62 Acute posthemorrhagic anemia: Secondary | ICD-10-CM | POA: Diagnosis not present

## 2016-04-06 DIAGNOSIS — Z202 Contact with and (suspected) exposure to infections with a predominantly sexual mode of transmission: Secondary | ICD-10-CM

## 2016-04-06 DIAGNOSIS — N939 Abnormal uterine and vaginal bleeding, unspecified: Secondary | ICD-10-CM

## 2016-04-06 DIAGNOSIS — B373 Candidiasis of vulva and vagina: Secondary | ICD-10-CM

## 2016-04-06 DIAGNOSIS — Z87891 Personal history of nicotine dependence: Secondary | ICD-10-CM | POA: Insufficient documentation

## 2016-04-06 DIAGNOSIS — B3731 Acute candidiasis of vulva and vagina: Secondary | ICD-10-CM

## 2016-04-06 LAB — URINE MICROSCOPIC-ADD ON: RBC / HPF: NONE SEEN RBC/hpf (ref 0–5)

## 2016-04-06 LAB — COMPREHENSIVE METABOLIC PANEL
ALT: 51 U/L (ref 14–54)
AST: 37 U/L (ref 15–41)
Albumin: 3.6 g/dL (ref 3.5–5.0)
Alkaline Phosphatase: 97 U/L (ref 38–126)
Anion gap: 9 (ref 5–15)
BILIRUBIN TOTAL: 0.4 mg/dL (ref 0.3–1.2)
BUN: 5 mg/dL — AB (ref 6–20)
CALCIUM: 8.8 mg/dL — AB (ref 8.9–10.3)
CO2: 27 mmol/L (ref 22–32)
CREATININE: 0.55 mg/dL (ref 0.44–1.00)
Chloride: 101 mmol/L (ref 101–111)
GFR calc Af Amer: >60 mL/min (ref >60)
Glucose, Bld: 140 mg/dL — ABNORMAL HIGH (ref 65–99)
POTASSIUM: 2.9 mmol/L — AB (ref 3.5–5.1)
Sodium: 137 mmol/L (ref 135–145)
TOTAL PROTEIN: 7.7 g/dL (ref 6.5–8.1)

## 2016-04-06 LAB — CBC
HEMATOCRIT: 27.8 % — AB (ref 36.0–46.0)
Hemoglobin: 8.6 g/dL — ABNORMAL LOW (ref 12.0–15.0)
MCH: 22.4 pg — ABNORMAL LOW (ref 26.0–34.0)
MCHC: 30.9 g/dL (ref 30.0–36.0)
MCV: 72.4 fL — AB (ref 78.0–100.0)
Platelets: 468 10*3/uL — ABNORMAL HIGH (ref 150–400)
RBC: 3.84 MIL/uL — ABNORMAL LOW (ref 3.87–5.11)
RDW: 16.4 % — AB (ref 11.5–15.5)
WBC: 11.8 10*3/uL — AB (ref 4.0–10.5)

## 2016-04-06 LAB — WET PREP, GENITAL
Clue Cells Wet Prep HPF POC: NONE SEEN
Sperm: NONE SEEN
TRICH WET PREP: NONE SEEN

## 2016-04-06 LAB — URINALYSIS, ROUTINE W REFLEX MICROSCOPIC
Bilirubin Urine: NEGATIVE
GLUCOSE, UA: NEGATIVE mg/dL
Hgb urine dipstick: NEGATIVE
KETONES UR: NEGATIVE mg/dL
NITRITE: NEGATIVE
PROTEIN: 30 mg/dL — AB
Specific Gravity, Urine: 1.025 (ref 1.005–1.030)
pH: 6.5 (ref 5.0–8.0)

## 2016-04-06 LAB — POCT PREGNANCY, URINE: Preg Test, Ur: NEGATIVE

## 2016-04-06 MED ORDER — KETOROLAC TROMETHAMINE 10 MG PO TABS: 10.0000 mg | ORAL_TABLET | Freq: Four times a day (QID) | ORAL | Status: DC | PRN

## 2016-04-06 MED ORDER — FLUCONAZOLE 150 MG PO TABS: 150.0000 mg | ORAL_TABLET | Freq: Once | ORAL | Status: DC

## 2016-04-06 MED ORDER — KETOROLAC TROMETHAMINE 60 MG/2ML IM SOLN
60.0000 mg | Freq: Once | INTRAMUSCULAR | Status: AC
  Administered 2016-04-06: 60 mg via INTRAMUSCULAR
  Filled 2016-04-06: qty 2

## 2016-04-06 MED ORDER — CYCLOBENZAPRINE HCL 10 MG PO TABS: 10.0000 mg | ORAL_TABLET | Freq: Three times a day (TID) | ORAL | Status: DC | PRN

## 2016-04-06 MED ORDER — FLUCONAZOLE 150 MG PO TABS
150.0000 mg | ORAL_TABLET | Freq: Once | ORAL | Status: AC
  Administered 2016-04-06: 150 mg via ORAL
  Filled 2016-04-06: qty 1

## 2016-04-06 MED ORDER — AZITHROMYCIN 250 MG PO TABS
1000.0000 mg | ORAL_TABLET | Freq: Once | ORAL | Status: AC
  Administered 2016-04-06: 1000 mg via ORAL
  Filled 2016-04-06: qty 4

## 2016-04-06 MED ORDER — CEFTRIAXONE SODIUM 250 MG IJ SOLR
250.0000 mg | Freq: Once | INTRAMUSCULAR | Status: AC
  Administered 2016-04-06: 250 mg via INTRAMUSCULAR
  Filled 2016-04-06: qty 250

## 2016-04-06 MED ORDER — FERROUS SULFATE 325 (65 FE) MG PO TABS: 325.0000 mg | ORAL_TABLET | Freq: Two times a day (BID) | ORAL | Status: AC

## 2016-04-06 NOTE — MAU Note (Signed)
Pt states she is having upper right abdominal pain.  Pt states the pain is constant.  Pt states she thinks she has an STD because her boyfriend told her, "his penis is dripping."  Pt states she had a prolonged period.  Pt states she is having clumpy white discharge.

## 2016-04-06 NOTE — Discharge Instructions (Signed)
Sexually Transmitted Disease °A sexually transmitted disease (STD) is a disease or infection that may be passed (transmitted) from person to person, usually during sexual activity. This may happen by way of saliva, semen, blood, vaginal mucus, or urine. Common STDs include: °· Gonorrhea. °· Chlamydia. °· Syphilis. °· HIV and AIDS. °· Genital herpes. °· Hepatitis B and C. °· Trichomonas. °· Human papillomavirus (HPV). °· Pubic lice. °· Scabies. °· Mites. °· Bacterial vaginosis. °WHAT ARE CAUSES OF STDs? °An STD may be caused by bacteria, a virus, or parasites. STDs are often transmitted during sexual activity if one person is infected. However, they may also be transmitted through nonsexual means. STDs may be transmitted after:  °· Sexual intercourse with an infected person. °· Sharing sex toys with an infected person. °· Sharing needles with an infected person or using unclean piercing or tattoo needles. °· Having intimate contact with the genitals, mouth, or rectal areas of an infected person. °· Exposure to infected fluids during birth. °WHAT ARE THE SIGNS AND SYMPTOMS OF STDs? °Different STDs have different symptoms. Some people may not have any symptoms. If symptoms are present, they may include: °· Painful or bloody urination. °· Pain in the pelvis, abdomen, vagina, anus, throat, or eyes. °· A skin rash, itching, or irritation. °· Growths, ulcerations, blisters, or sores in the genital and anal areas. °· Abnormal vaginal discharge with or without bad odor. °· Penile discharge in men. °· Fever. °· Pain or bleeding during sexual intercourse. °· Swollen glands in the groin area. °· Yellow skin and eyes (jaundice). This is seen with hepatitis. °· Swollen testicles. °· Infertility. °· Sores and blisters in the mouth. °HOW ARE STDs DIAGNOSED? °To make a diagnosis, your health care provider may: °· Take a medical history. °· Perform a physical exam. °· Take a sample of any discharge to examine. °· Swab the throat,  cervix, opening to the penis, rectum, or vagina for testing. °· Test a sample of your first morning urine. °· Perform blood tests. °· Perform a Pap test, if this applies. °· Perform a colposcopy. °· Perform a laparoscopy. °HOW ARE STDs TREATED? °Treatment depends on the STD. Some STDs may be treated but not cured. °· Chlamydia, gonorrhea, trichomonas, and syphilis can be cured with antibiotic medicine. °· Genital herpes, hepatitis, and HIV can be treated, but not cured, with prescribed medicines. The medicines lessen symptoms. °· Genital warts from HPV can be treated with medicine or by freezing, burning (electrocautery), or surgery. Warts may come back. °· HPV cannot be cured with medicine or surgery. However, abnormal areas may be removed from the cervix, vagina, or vulva. °· If your diagnosis is confirmed, your recent sexual partners need treatment. This is true even if they are symptom-free or have a negative culture or evaluation. They should not have sex until their health care providers say it is okay. °· Your health care provider may test you for infection again 3 months after treatment. °HOW CAN I REDUCE MY RISK OF GETTING AN STD? °Take these steps to reduce your risk of getting an STD: °· Use latex condoms, dental dams, and water-soluble lubricants during sexual activity. Do not use petroleum jelly or oils. °· Avoid having multiple sex partners. °· Do not have sex with someone who has other sex partners °· Do not have sex with anyone you do not know or who is at high risk for an STD. °· Avoid risky sex practices that can break your skin. °· Do not have sex   if you have open sores on your mouth or skin.  Avoid drinking too much alcohol or taking illegal drugs. Alcohol and drugs can affect your judgment and put you in a vulnerable position.  Avoid engaging in oral and anal sex acts.  Get vaccinated for HPV and hepatitis. If you have not received these vaccines in the past, talk to your health care  provider about whether one or both might be right for you.  If you are at risk of being infected with HIV, it is recommended that you take a prescription medicine daily to prevent HIV infection. This is called pre-exposure prophylaxis (PrEP). You are considered at risk if:  You are a man who has sex with other men (MSM).  You are a heterosexual man or woman and are sexually active with more than one partner.  You take drugs by injection.  You are sexually active with a partner who has HIV.  Talk with your health care provider about whether you are at high risk of being infected with HIV. If you choose to begin PrEP, you should first be tested for HIV. You should then be tested every 3 months for as long as you are taking PrEP. WHAT SHOULD I DO IF I THINK I HAVE AN STD?  See your health care provider.  Tell your sexual partner(s). They should be tested and treated for any STDs.  Do not have sex until your health care provider says it is okay. WHEN SHOULD I GET IMMEDIATE MEDICAL CARE? Contact your health care provider right away if:   You have severe abdominal pain.  You are a man and notice swelling or pain in your testicles.  You are a woman and notice swelling or pain in your vagina.   This information is not intended to replace advice given to you by your health care provider. Make sure you discuss any questions you have with your health care provider.   Document Released: 12/11/2002 Document Revised: 10/11/2014 Document Reviewed: 04/10/2013 Elsevier Interactive Patient Education 2016 [employer]   Abnormal Uterine Bleeding Abnormal uterine bleeding can affect women at various stages in life, including teenagers, women in their reproductive years, pregnant women, and women who have reached menopause. Several kinds of uterine bleeding are considered abnormal, including:  Bleeding or spotting between periods.   Bleeding after sexual intercourse.   Bleeding that is  heavier or more than normal.   Periods that last longer than usual.  Bleeding after menopause.  Many cases of abnormal uterine bleeding are minor and simple to treat, while others are more serious. Any type of abnormal bleeding should be evaluated by your health care provider. Treatment will depend on the cause of the bleeding. HOME CARE INSTRUCTIONS Monitor your condition for any changes. The following actions may help to alleviate any discomfort you are experiencing:  Avoid the use of tampons and douches as directed by your health care provider.  Change your pads frequently. You should get regular pelvic exams and Pap tests. Keep all follow-up appointments for diagnostic tests as directed by your health care provider.  SEEK MEDICAL CARE IF:   Your bleeding lasts more than 1 week.   You feel dizzy at times.  SEEK IMMEDIATE MEDICAL CARE IF:   You pass out.   You are changing pads every 15 to 30 minutes.   You have abdominal pain.  You have a fever.   You become sweaty or weak.   You are passing large blood clots from the  vagina.   You start to feel nauseous and vomit. MAKE SURE YOU:   Understand these instructions.  Will watch your condition.  Will get help right away if you are not doing well or get worse.   This information is not intended to replace advice given to you by your health care provider. Make sure you discuss any questions you have with your health care provider.   Document Released: 09/20/2005 Document Revised: 09/25/2013 Document Reviewed: 04/19/2013 Elsevier Interactive Patient Education [employer]2016 [employer]
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
## 2016-04-07 LAB — GC/CHLAMYDIA PROBE AMP (~~LOC~~) NOT AT ARMC
CHLAMYDIA, DNA PROBE: POSITIVE — AB
NEISSERIA GONORRHEA: NEGATIVE

## 2016-04-07 LAB — HIV ANTIBODY (ROUTINE TESTING W REFLEX): HIV Screen 4th Generation wRfx: NONREACTIVE

## 2016-04-08 LAB — RPR: RPR Ser Ql: NONREACTIVE

## 2016-04-12 ENCOUNTER — Other Ambulatory Visit: Payer: Self-pay | Admitting: Advanced Practice Midwife

## 2016-04-12 DIAGNOSIS — E876 Hypokalemia: Secondary | ICD-10-CM

## 2016-04-12 MED ORDER — POTASSIUM CHLORIDE CRYS ER 20 MEQ PO TBCR: 20.0000 meq | EXTENDED_RELEASE_TABLET | Freq: Three times a day (TID) | ORAL | Status: DC

## 2016-04-12 NOTE — Progress Notes (Signed)
Incidental finding of low potassium at MAU visit, not noted before patient was discharged. Rx potassium supplement.

## 2016-04-16 NOTE — Progress Notes (Addendum)
Called pt to inform her of low potassium level and treatment prescribed. Left message for her to call back and state whether a detailed message of results can be left on her voice mail.  Pt needs to be informed that Rx has been sent to her pharmacy and it is recommended that she have follow up test of potassium level in 1 month @ her PCP or Urgent Care.   7/21  1145  Called pt again and left message that I am trying to reach her to discuss recent test results as well as treatment prescribed. This is not an urgent matter.  I will send a letter with these details and she may call back if she has any questions.

## 2016-04-23 ENCOUNTER — Encounter: Payer: Self-pay | Admitting: *Deleted

## 2016-04-26 ENCOUNTER — Ambulatory Visit: Payer: Medicaid Other | Admitting: Obstetrics and Gynecology

## 2016-07-25 ENCOUNTER — Emergency Department (HOSPITAL_COMMUNITY)
Admission: EM | Admit: 2016-07-25 | Discharge: 2016-07-25 | Disposition: A | Discharge status: Home / Self Care | Payer: Medicaid Other | Attending: Emergency Medicine | Admitting: Emergency Medicine

## 2016-07-25 ENCOUNTER — Encounter (HOSPITAL_COMMUNITY): Payer: Self-pay | Admitting: Emergency Medicine

## 2016-07-25 ENCOUNTER — Emergency Department (HOSPITAL_COMMUNITY): Payer: Medicaid Other

## 2016-07-25 DIAGNOSIS — J45909 Unspecified asthma, uncomplicated: Secondary | ICD-10-CM | POA: Insufficient documentation

## 2016-07-25 DIAGNOSIS — S0990XA Unspecified injury of head, initial encounter: Secondary | ICD-10-CM | POA: Insufficient documentation

## 2016-07-25 DIAGNOSIS — Z87891 Personal history of nicotine dependence: Secondary | ICD-10-CM | POA: Insufficient documentation

## 2016-07-25 DIAGNOSIS — Y999 Unspecified external cause status: Secondary | ICD-10-CM | POA: Insufficient documentation

## 2016-07-25 DIAGNOSIS — S0083XA Contusion of other part of head, initial encounter: Secondary | ICD-10-CM

## 2016-07-25 DIAGNOSIS — Y939 Activity, unspecified: Secondary | ICD-10-CM | POA: Diagnosis not present

## 2016-07-25 DIAGNOSIS — Y929 Unspecified place or not applicable: Secondary | ICD-10-CM | POA: Insufficient documentation

## 2016-07-25 DIAGNOSIS — S01312A Laceration without foreign body of left ear, initial encounter: Secondary | ICD-10-CM | POA: Diagnosis not present

## 2016-07-25 DIAGNOSIS — S0991XA Unspecified injury of ear, initial encounter: Secondary | ICD-10-CM | POA: Diagnosis present

## 2016-07-25 MED ORDER — ACETAMINOPHEN 325 MG PO TABS
650.0000 mg | ORAL_TABLET | Freq: Once | ORAL | Status: AC
  Administered 2016-07-25: 650 mg via ORAL
  Filled 2016-07-25: qty 2

## 2016-07-25 MED ORDER — NAPROXEN 250 MG PO TABS: 250.0000 mg | ORAL_TABLET | Freq: Two times a day (BID) | ORAL | 0 refills | Status: DC

## 2016-07-25 MED ORDER — IBUPROFEN 800 MG PO TABS
800.0000 mg | ORAL_TABLET | Freq: Once | ORAL | Status: AC
  Administered 2016-07-25: 800 mg via ORAL
  Filled 2016-07-25: qty 1
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
## 2016-07-25 NOTE — ED Notes (Signed)
Social Worker at the bedside.  
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
## 2016-07-25 NOTE — ED Notes (Signed)
PA to go back into room. Made aware of patient's pain.

## 2016-07-25 NOTE — ED Notes (Signed)
Pt returned from CT waiting on results
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
## 2017-02-07 ENCOUNTER — Emergency Department (HOSPITAL_COMMUNITY): Payer: Medicaid Other

## 2017-02-07 ENCOUNTER — Emergency Department (HOSPITAL_COMMUNITY)
Admission: EM | Admit: 2017-02-07 | Discharge: 2017-02-07 | Disposition: A | Discharge status: Home / Self Care | Payer: Medicaid Other | Attending: Emergency Medicine | Admitting: Emergency Medicine

## 2017-02-07 ENCOUNTER — Encounter (HOSPITAL_COMMUNITY): Payer: Self-pay | Admitting: Emergency Medicine

## 2017-02-07 DIAGNOSIS — Z79899 Other long term (current) drug therapy: Secondary | ICD-10-CM | POA: Diagnosis not present

## 2017-02-07 DIAGNOSIS — W228XXA Striking against or struck by other objects, initial encounter: Secondary | ICD-10-CM | POA: Insufficient documentation

## 2017-02-07 DIAGNOSIS — Y999 Unspecified external cause status: Secondary | ICD-10-CM | POA: Insufficient documentation

## 2017-02-07 DIAGNOSIS — Y929 Unspecified place or not applicable: Secondary | ICD-10-CM | POA: Diagnosis not present

## 2017-02-07 DIAGNOSIS — J45909 Unspecified asthma, uncomplicated: Secondary | ICD-10-CM | POA: Insufficient documentation

## 2017-02-07 DIAGNOSIS — S99922A Unspecified injury of left foot, initial encounter: Secondary | ICD-10-CM | POA: Diagnosis present

## 2017-02-07 DIAGNOSIS — Z87891 Personal history of nicotine dependence: Secondary | ICD-10-CM | POA: Diagnosis not present

## 2017-02-07 DIAGNOSIS — Y939 Activity, unspecified: Secondary | ICD-10-CM | POA: Insufficient documentation

## 2017-02-07 MED ORDER — METHOCARBAMOL 500 MG PO TABS
500.0000 mg | ORAL_TABLET | Freq: Once | ORAL | Status: AC
  Administered 2017-02-07: 500 mg via ORAL
  Filled 2017-02-07: qty 1

## 2017-02-07 MED ORDER — METHOCARBAMOL 500 MG PO TABS: 500.0000 mg | ORAL_TABLET | Freq: Two times a day (BID) | ORAL | 0 refills | Status: DC

## 2017-02-07 NOTE — ED Triage Notes (Signed)
Pt to ER for evaluation of left great toe pain after stumping it on a step yesterday. Reports swelling and pain.

## 2017-02-07 NOTE — Discharge Instructions (Signed)
Use an ice bath 3-4 times daily alternating 20 minutes on, 20 minutes off. Alternate ibuprofen and Tylenol as needed for your pain. Take Robaxin twice daily as needed for muscle pain and spasms. Wear your postop shoe for support when walking. Please follow-up with Dr. Logan BoresEvans, a podiatrist, for further evaluation and treatment of your toe injury.

## 2017-02-07 NOTE — Discharge Planning (Signed)
Pt up for discharge. EDCM reviewed chart for possible CM needs.  No needs identified or communicated.  

## 2017-02-07 NOTE — ED Notes (Signed)
Papers reviewed and post op shoe applied. She verbalizes understanding. Pain still reported because she isn't going to report anything less

## 2017-02-07 NOTE — ED Provider Notes (Signed)
[facility]-EMERGENCY DEPT Provider Note   CSN: [id number] Arrival date & time: 02/07/17  [id number]   By signing my name below, I, Bobbie Stackhristopher Reid, attest that this documentation has been prepared under the direction and in the presence of Buel ReamAlexandra Linkin Vizzini, PA-C. Electronically Signed: Bobbie Stackhristopher Reid, Scribe. 02/07/17. 9:32 AM. History   Chief Complaint Chief Complaint  Patient presents with  . Toe Injury    The history is provided by the patient. No language interpreter was used.  HPI Comments: Kristin Powers is a 26 y.o. female who presents to the Emergency Department complaining of left great toe pain with some swelling since yesterday. She states that the pain shoots through her great toe up into her left foot. She rates her pain 10/10 currently. Pain worsens with palpation and movement. She states that she was running yesterday and hit her foot on a cement step. She states that she did not hit her head. She has tried putting ice on her foot. She has also tried taking ibuprofen and tylenol at home with no significant relief. She last took ibuprofen 800 mg at 6 am today with no relief. She denies any ankle or knee pain. Patient reports stubbing her toe in the past and having her toe nail grow back unevenly.  Past Medical History:  Diagnosis Date  . Abnormal Pap smear   . Acid reflux   . Asthma   . Depression   . Headache(784.0)   . Human papilloma virus   . Pregnancy induced hypertension   . Trichomonas   . Urinary tract infection   . Vaginal Pap smear, abnormal     Patient Active Problem List   Diagnosis Date Noted  . Group B Streptococcus carrier, +RV culture, currently pregnant 09/24/2015  . History of prior pregnancy with IUGR newborn 06/27/2015  . Asthma 06/27/2015  . Depression 06/27/2015  . [facility], [facility]  . Obesity 06/27/2015  . Positive urine drug screen 06/27/2015    Past Surgical History:  Procedure Laterality Date  . INDUCED  ABORTION  March 2013    OB History    Gravida Para Term Preterm AB Living   4 3 3  0 1 3   SAB TAB Ectopic Multiple Live Births   0 1 0 0 3       Home Medications    Prior to Admission medications   Medication Sig Start Date End Date Taking? Authorizing Provider  ferrous sulfate (FERROUSUL) 325 (65 FE) MG tablet Take 1 tablet (325 mg total) by mouth 2 (two) times daily. 04/06/16   Katrinka BlazingSmith, [place], CNM  fluconazole (DIFLUCAN) 150 MG tablet Take 1 tablet (150 mg total) by mouth once. Can take additional dose three days later if symptoms persist Patient not taking: Reported on 07/25/2016 04/06/16   Katrinka BlazingSmith, [place], CNM  ketorolac (TORADOL) 10 MG tablet Take 1 tablet (10 mg total) by mouth every 6 (six) hours as needed. Patient not taking: Reported on 07/25/2016 04/06/16   Katrinka BlazingSmith, [place], CNM  methocarbamol (ROBAXIN) 500 MG tablet Take 1 tablet (500 mg total) by mouth 2 (two) times daily. 02/07/17   Crysten Kaman, Waylan BogaAlexandra M, PA-C  naproxen (NAPROSYN) 250 MG tablet Take 1 tablet (250 mg total) by mouth 2 (two) times daily with a meal. 07/25/16   Everlene Farrieransie, William, PA-C  potassium chloride SA (K-DUR,KLOR-CON) 20 MEQ tablet Take 1 tablet (20 mEq total) by mouth 3 (three) times daily. Patient not taking: Reported on 07/25/2016 04/12/16   Dorathy KinsmanSmith, [place], CNM  Family History Family History  Problem Relation Age of Onset  . Depression Mother   . Hypertension Mother   . Diabetes Mother   . Hypertension Father   . Anesthesia problems Neg Hx   . Hypotension Neg Hx   . Malignant hyperthermia Neg Hx   . Pseudochol deficiency Neg Hx   . Other Neg Hx     Social History Social History  Substance Use Topics  . Smoking status: Former Smoker    Packs/day: 0.50    Years: 2.00    Types: Cigarettes  . Smokeless tobacco: Never Used  . Alcohol use No     Allergies   Patient has no known allergies.   Review of Systems Review of Systems  Constitutional: Negative for fever.  Musculoskeletal: Positive  for myalgias.       Right great toe and right foot pain.  Neurological: Negative for syncope and headaches.     Physical Exam Updated Vital Signs BP (!) 136/94 (BP Location: Left Arm)   Pulse 86   Temp 98.2 F (36.8 C) (Oral)   Resp 16   Ht 5' 2.5" (1.588 m)   Wt 86.2 kg   LMP 02/07/2017   SpO2 100%   BMI 34.20 kg/m   Physical Exam  Constitutional: She appears well-developed and well-nourished. No distress.  HENT:  Head: Normocephalic and atraumatic.  Mouth/Throat: Oropharynx is clear and moist. No oropharyngeal exudate.  Eyes: Conjunctivae are normal. Pupils are equal, round, and reactive to light. Right eye exhibits no discharge. Left eye exhibits no discharge. No scleral icterus.  Neck: Normal range of motion. Neck supple. No thyromegaly present.  Cardiovascular: Normal rate, regular rhythm, normal heart sounds and intact distal pulses.  Exam reveals no gallop and no friction rub.   No murmur heard. Pulmonary/Chest: Effort normal and breath sounds normal. No stridor. No respiratory distress. She has no wheezes. She has no rales.  Abdominal: Soft. Bowel sounds are normal. She exhibits no distension. There is no tenderness. There is no rebound and no guarding.  Musculoskeletal: She exhibits edema and tenderness.  L great toe: mild edema, tenderness to great toe and just distal; yellowed, thick nail intact; no ingrown nail; no infection at this time; normal sensation; cap refill <2secs; range of motion intact  Lymphadenopathy:    She has no cervical adenopathy.  Neurological: She is alert. Coordination normal.  Skin: Skin is warm and dry. No rash noted. She is not diaphoretic. No pallor.  Psychiatric: She has a normal mood and affect.  Nursing note and vitals reviewed.    ED Treatments / Results  DIAGNOSTIC STUDIES: Oxygen Saturation is 100% on RA, normal by my interpretation.    COORDINATION OF CARE: 9:16 AM Discussed treatment plan with pt at bedside and pt agreed to  plan. I will give her a referral to a podiatrist.  Labs (all labs ordered are listed, but only abnormal results are displayed) Labs Reviewed - No data to display  EKG  EKG Interpretation None       Radiology Dg Toe Great Left  Result Date: 02/07/2017 CLINICAL DATA:  Hit toe against cement block EXAM: LEFT FIRST TOE:  3  VIEWS COMPARISON:  None. FINDINGS: Frontal, oblique, and lateral views obtained. Ungual injury noted. No bony fracture or dislocation. Joint spaces appear normal. No erosive change. IMPRESSION: Goal injury. No bony fracture or dislocation. No appreciable arthropathy. Electronically Signed   By: Bretta Bang III M.D.   On: 02/07/2017 08:30  Procedures Procedures (including critical care time)  Medications Ordered in ED Medications  methocarbamol (ROBAXIN) tablet 500 mg (500 mg Oral Given 02/07/17 0926)     Initial Impression / Assessment and Plan / ED Course  I have reviewed the triage vital signs and the nursing notes.  Pertinent labs & imaging results that were available during my care of the patient were reviewed by me and considered in my medical decision making (see chart for details).     X-ray of great toe shows ungal injury, no bony fracture or dislocation or arthropathy.No toenail involvement or laceration. No subungual hematoma noted. Patient to follow-up with podiatry for further evaluation and treatment of injury. Patient requested a muscle relaxer for injury in addition to ibuprofen and Tylenol. Discharged home with short course of Robaxin. I advised elevation and ice packs. Return precautions discussed. Patient understands and agrees with plan. Patient vitals stable throughout ED course and discharged in satisfactory condition.  Final Clinical Impressions(s) / ED Diagnoses   Final diagnoses:  Injury of toe on left foot, initial encounter    New Prescriptions Discharge Medication List as of 02/07/2017  9:34 AM    START taking these  medications   Details  methocarbamol (ROBAXIN) 500 MG tablet Take 1 tablet (500 mg total) by mouth 2 (two) times daily., Starting Mon 02/07/2017, Print       I personally performed the services described in this documentation, which was scribed in my presence. The recorded information has been reviewed and is accurate.        Emi Holes, PA-C 02/07/17 1612    Raeford Razor, MD 02/08/17 2108

## 2017-02-07 NOTE — Progress Notes (Signed)
Orthopedic Tech Progress Note Patient Details:  Kristin Powers [birth year] [id number]  Ortho Devices Type of Ortho Device: Postop shoe/boot Ortho Device/Splint Location: lle Ortho Device/Splint Interventions: Application   Elsworth Ledin 02/07/2017, 9:37 AM

## 2017-02-07 NOTE — ED Notes (Signed)
Ortho tech paged due to no stock of post op shoes in department at this time.

## 2017-11-03 ENCOUNTER — Emergency Department (HOSPITAL_COMMUNITY)
Admission: EM | Admit: 2017-11-03 | Discharge: 2017-11-04 | Disposition: A | Discharge status: Home / Self Care | Payer: Medicaid Other | Attending: Emergency Medicine | Admitting: Emergency Medicine

## 2017-11-03 ENCOUNTER — Encounter (HOSPITAL_COMMUNITY): Payer: Self-pay | Admitting: Emergency Medicine

## 2017-11-03 DIAGNOSIS — Z79899 Other long term (current) drug therapy: Secondary | ICD-10-CM | POA: Insufficient documentation

## 2017-11-03 DIAGNOSIS — R51 Headache: Secondary | ICD-10-CM | POA: Insufficient documentation

## 2017-11-03 DIAGNOSIS — Z87891 Personal history of nicotine dependence: Secondary | ICD-10-CM | POA: Insufficient documentation

## 2017-11-03 DIAGNOSIS — R519 Headache, unspecified: Secondary | ICD-10-CM

## 2017-11-03 DIAGNOSIS — J45909 Unspecified asthma, uncomplicated: Secondary | ICD-10-CM | POA: Insufficient documentation
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
## 2017-11-04 ENCOUNTER — Emergency Department (HOSPITAL_COMMUNITY): Payer: Medicaid Other

## 2017-11-04 MED ORDER — DIPHENHYDRAMINE HCL 50 MG/ML IJ SOLN
25.0000 mg | Freq: Once | INTRAMUSCULAR | Status: AC
  Administered 2017-11-04: 25 mg via INTRAVENOUS
  Filled 2017-11-04: qty 1

## 2017-11-04 MED ORDER — METOCLOPRAMIDE HCL 5 MG/ML IJ SOLN
10.0000 mg | Freq: Once | INTRAMUSCULAR | Status: AC
  Administered 2017-11-04: 10 mg via INTRAVENOUS
  Filled 2017-11-04: qty 2

## 2017-11-04 MED ORDER — SODIUM CHLORIDE 0.9 % IV BOLUS (SEPSIS)
1000.0000 mL | Freq: Once | INTRAVENOUS | Status: AC
  Administered 2017-11-04: 1000 mL via INTRAVENOUS

## 2017-11-04 NOTE — ED Notes (Signed)
ED Provider at bedside. 

## 2017-11-04 NOTE — ED Notes (Signed)
Patient transported to CT 
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
## 2017-11-04 NOTE — Discharge Instructions (Signed)
Your head CT was negative.  Swelling should improve over the next few days, can continue using ice as needed. Follow-up with your primary care doctor. He should return here for any new/acute changes.

## 2018-09-28 ENCOUNTER — Encounter (HOSPITAL_COMMUNITY): Payer: Self-pay

## 2018-09-28 ENCOUNTER — Ambulatory Visit (HOSPITAL_COMMUNITY)
Admission: EM | Admit: 2018-09-28 | Discharge: 2018-09-28 | Disposition: A | Discharge status: Home / Self Care | Payer: Medicaid Other | Attending: Family Medicine | Admitting: Family Medicine

## 2018-09-28 DIAGNOSIS — Z113 Encounter for screening for infections with a predominantly sexual mode of transmission: Secondary | ICD-10-CM | POA: Diagnosis present

## 2018-09-28 DIAGNOSIS — N898 Other specified noninflammatory disorders of vagina: Secondary | ICD-10-CM

## 2018-09-28 DIAGNOSIS — B373 Candidiasis of vulva and vagina: Secondary | ICD-10-CM

## 2018-09-28 DIAGNOSIS — B3731 Acute candidiasis of vulva and vagina: Secondary | ICD-10-CM

## 2018-09-28 MED ORDER — TERCONAZOLE 0.8 % VA CREA: 1.0000 | TOPICAL_CREAM | Freq: Every day | VAGINAL | 0 refills | Status: DC

## 2018-09-28 MED ORDER — AZITHROMYCIN 250 MG PO TABS
1000.0000 mg | ORAL_TABLET | Freq: Once | ORAL | Status: AC
  Administered 2018-09-28: 1000 mg via ORAL

## 2018-09-28 MED ORDER — CEFTRIAXONE SODIUM 250 MG IJ SOLR
250.0000 mg | Freq: Once | INTRAMUSCULAR | Status: AC
  Administered 2018-09-28: 250 mg via INTRAMUSCULAR

## 2018-09-28 MED ORDER — AZITHROMYCIN 250 MG PO TABS
ORAL_TABLET | ORAL | Status: AC
  Filled 2018-09-28: qty 4

## 2018-09-28 MED ORDER — LIDOCAINE HCL (PF) 1 % IJ SOLN
INTRAMUSCULAR | Status: AC
  Filled 2018-09-28: qty 2

## 2018-09-28 MED ORDER — CEFTRIAXONE SODIUM 250 MG IJ SOLR
INTRAMUSCULAR | Status: AC
  Filled 2018-09-28: qty 250

## 2018-09-28 NOTE — ED Triage Notes (Signed)
Pt presents with vaginal discharge, itching and burning.

## 2018-09-28 NOTE — Discharge Instructions (Addendum)

## 2018-09-29 LAB — CERVICOVAGINAL ANCILLARY ONLY
BACTERIAL VAGINITIS: POSITIVE — AB
CANDIDA VAGINITIS: NEGATIVE
CHLAMYDIA, DNA PROBE: NEGATIVE
Neisseria Gonorrhea: NEGATIVE
TRICH (WINDOWPATH): NEGATIVE

## 2018-10-02 ENCOUNTER — Telehealth (HOSPITAL_COMMUNITY): Payer: Self-pay | Admitting: Emergency Medicine

## 2018-10-02 MED ORDER — METRONIDAZOLE 500 MG PO TABS: 500.0000 mg | ORAL_TABLET | Freq: Two times a day (BID) | ORAL | 0 refills | Status: AC

## 2018-10-02 NOTE — Telephone Encounter (Signed)
Bacterial vaginosis is positive. This was not treated at the urgent care visit.  Flagyl 500 mg BID x 7 days #14 no refills sent to patients pharmacy of choice.    Contacted patient about results. All questions answered.

## 2018-10-03 NOTE — ED Provider Notes (Signed)
[facility]   [id number] 09/28/18 Arrival Time: 1607  ASSESSMENT & PLAN:  1. Vaginal discharge   2. Vaginal yeast infection    Meds ordered this encounter  Medications  . terconazole (TERAZOL 3) 0.8 % vaginal cream    Sig: Place 1 applicator vaginally at bedtime.    Dispense:  20 g    Refill:  0  . azithromycin (ZITHROMAX) tablet 1,000 mg  . cefTRIAXone (ROCEPHIN) injection 250 mg   Prefers empiric treatment for gonorrhea and chlamydia in addition to treatment of possible vaginal yeast infection. Discussed possibility of BV. Will await cytology results. No signs of PID at this time. Discussed.   Discharge Instructions     You have been given the following medications today for treatment of suspected gonorrhea and/or chlamydia:  cefTRIAXone (ROCEPHIN) injection 250 mg azithromycin (ZITHROMAX) tablet 1,000 mg  Even though we have treated you today, we have sent testing for sexually transmitted infections. We will notify you of any positive results once they are received. If required, we will prescribe any medications you might need.  Please refrain from all sexual activity for at least the next seven days.    Pending: Labs Reviewed  CERVICOVAGINAL ANCILLARY ONLY   Will notify of any positive results and additional treatment if needed. Instructed to refrain from sexual activity for at least seven days.  Reviewed expectations re: course of current medical issues. Questions answered. Outlined signs and symptoms indicating need for more acute intervention. Patient verbalized understanding. After Visit Summary given.   SUBJECTIVE:  Kristin Powers is a 27 y.o. female who presents with complaint of vaginal discharge and itching. Onset gradual, over the past several days. Describes discharge as thick and white/clumpy. H/O yeast infections; "this feels just like one". No known triggers. No recent antibiotic use. Urinary symptoms: none. Afebrile. No abdominal or  pelvic pain. No n/v. No rashes or lesions. Sexually active with single female partner. Home treatment: Reports that she has taken a dose of fluconazole without much help. History of STI: none reported. No h/o BV reported.  No LMP recorded. Patient has had an injection.  ROS: As per HPI. All other systems negative.   OBJECTIVE:  Vitals:   09/28/18 1730  BP: 140/83  Pulse: 90  Resp: 20  Temp: 98.5 F (36.9 C)  TempSrc: Oral  SpO2: 100%    General appearance: alert, cooperative, appears stated age and no distress Throat: lips, mucosa, and tongue normal; teeth and gums normal Cv: RRR Lungs: CTAB Back: no CVA tenderness; FROM at waist Abdomen: soft, non-tender; no inguinal LAD GU: deferred Skin: warm and dry Psychological: alert and cooperative; normal mood and affect.  No Known Allergies  Past Medical History:  Diagnosis Date  . Abnormal Pap smear   . Acid reflux   . Asthma   . Depression   . Headache(784.0)   . Human papilloma virus   . Pregnancy induced hypertension   . Trichomonas   . Urinary tract infection   . Vaginal Pap smear, abnormal    Family History  Problem Relation Age of Onset  . Depression Mother   . Hypertension Mother   . Diabetes Mother   . Hypertension Father   . Anesthesia problems Neg Hx   . Hypotension Neg Hx   . Malignant hyperthermia Neg Hx   . Pseudochol deficiency Neg Hx   . Other Neg Hx    Social History   Socioeconomic History  . Marital status: Single    Spouse  name: Not on file  . Number of children: Not on file  . Years of education: Not on file  . Highest education level: Not on file  Occupational History  . Not on file  Social Needs  . Financial resource strain: Not on file  . Food insecurity:    Worry: Not on file    Inability: Not on file  . Transportation needs:    Medical: Not on file    Non-medical: Not on file  Tobacco Use  . Smoking status: Former Smoker    Packs/day: 0.50    Years: 2.00    Pack years:  1.00    Types: Cigarettes  . Smokeless tobacco: Never Used  Substance and Sexual Activity  . Alcohol use: No  . Drug use: No    Types: Hydrocodone  . Sexual activity: Yes    Birth control/protection: None  Lifestyle  . Physical activity:    Days per week: Not on file    Minutes per session: Not on file  . Stress: Not on file  Relationships  . Social connections:    Talks on phone: Not on file    Gets together: Not on file    Attends religious service: Not on file    Active member of club or organization: Not on file    Attends meetings of clubs or organizations: Not on file    Relationship status: Not on file  . Intimate partner violence:    Fear of current or ex partner: Not on file    Emotionally abused: Not on file    Physically abused: Not on file    Forced sexual activity: Not on file  Other Topics Concern  . Not on file  Social History Narrative  . Not on file          [name], [name], MD 10/03/18 [phone]

## 2020-01-13 ENCOUNTER — Encounter (HOSPITAL_COMMUNITY): Payer: Self-pay | Admitting: Emergency Medicine

## 2020-01-13 ENCOUNTER — Emergency Department (HOSPITAL_COMMUNITY)
Admission: EM | Admit: 2020-01-13 | Discharge: 2020-01-13 | Disposition: A | Discharge status: Home / Self Care | Payer: Medicaid Other | Attending: Emergency Medicine | Admitting: Emergency Medicine

## 2020-01-13 ENCOUNTER — Other Ambulatory Visit: Payer: Self-pay

## 2020-01-13 DIAGNOSIS — Z79899 Other long term (current) drug therapy: Secondary | ICD-10-CM | POA: Insufficient documentation

## 2020-01-13 DIAGNOSIS — M79604 Pain in right leg: Secondary | ICD-10-CM | POA: Diagnosis present

## 2020-01-13 DIAGNOSIS — M5431 Sciatica, right side: Secondary | ICD-10-CM | POA: Diagnosis not present

## 2020-01-13 DIAGNOSIS — Z87891 Personal history of nicotine dependence: Secondary | ICD-10-CM | POA: Diagnosis not present

## 2020-01-13 LAB — POC URINE PREG, ED: Preg Test, Ur: NEGATIVE

## 2020-01-13 MED ORDER — METHOCARBAMOL 500 MG PO TABS: 500.0000 mg | ORAL_TABLET | Freq: Two times a day (BID) | ORAL | 0 refills | Status: DC

## 2020-01-13 MED ORDER — LIDOCAINE 5 % EX PTCH: 1.0000 | MEDICATED_PATCH | CUTANEOUS | 0 refills | Status: DC

## 2020-01-13 MED ORDER — NAPROXEN 500 MG PO TABS: 500.0000 mg | ORAL_TABLET | Freq: Two times a day (BID) | ORAL | 0 refills | Status: DC

## 2020-01-13 MED ORDER — LIDOCAINE 5 % EX PTCH
1.0000 | MEDICATED_PATCH | CUTANEOUS | Status: DC
  Administered 2020-01-13: 10:00:00 1 via TRANSDERMAL
  Filled 2020-01-13: qty 1

## 2020-01-13 MED ORDER — KETOROLAC TROMETHAMINE 15 MG/ML IJ SOLN
15.0000 mg | Freq: Once | INTRAMUSCULAR | Status: AC
  Administered 2020-01-13: 10:00:00 15 mg via INTRAMUSCULAR
  Filled 2020-01-13: qty 1

## 2020-01-13 NOTE — ED Triage Notes (Signed)
Pt reports pain in her R buttocks that radiates down her leg. States she used a Scientist, clinical (histocompatibility and immunogenetics) last night that made pain worse.

## 2020-01-13 NOTE — ED Provider Notes (Signed)
[facility] EMERGENCY DEPARTMENT Provider Note   CSN: [id number] Arrival date & time: 01/13/20  2595     History Leg pain   ARIYONNA TWICHELL is a 29 y.o. female with medical history significant for headaches, depression, asthma who presents for evaluation of leg pain.  Patient states starting yesterday evening she developed a sharp shooting pain which radiates from her right posterior buttocks down the posterior and lateral aspect of her right leg.  Worse with movement.  She is having difficulty getting comfortable.  No recent injury or trauma however states she does work on her feet for long periods of time.  She denies any midline back pain.  No IV drug use, bowel or bladder incontinence, saddle paresthesia, history of malignancy.  Has been taking Tylenol without relief.  Denies chance of pregnancy.  Denies fever, chills, nausea, vomiting, chest pain, shortness of breath, abdominal pain, diarrhea, dysuria, constipation, redness, swelling, warmth to extremities.  Denies additional aggravating or alleviating factors.  History obtained from patient and past medical records.  No interpreter is used.        Past Medical History:  Diagnosis Date  . Abnormal Pap smear   . Acid reflux   . Asthma   . Depression   . Headache(784.0)   . Human papilloma virus   . Pregnancy induced hypertension   . Trichomonas   . Urinary tract infection   . Vaginal Pap smear, abnormal     Patient Active Problem List   Diagnosis Date Noted  . Group B Streptococcus carrier, +RV culture, currently pregnant 09/24/2015  . History of prior pregnancy with IUGR newborn 06/27/2015  . Asthma 06/27/2015  . Depression 06/27/2015  . Prior pregnancy complicated by [facility], antepartum 06/27/2015  . Obesity 06/27/2015  . Positive urine drug screen 06/27/2015    Past Surgical History:  Procedure Laterality Date  . INDUCED ABORTION  March 2013     OB History    Gravida  4   Para  3   Term  3    Preterm  0   AB  1   Living  3     SAB  0   TAB  1   Ectopic  0   Multiple  0   Live Births  3           Family History  Problem Relation Age of Onset  . Depression Mother   . Hypertension Mother   . Diabetes Mother   . Hypertension Father   . Anesthesia problems Neg Hx   . Hypotension Neg Hx   . Malignant hyperthermia Neg Hx   . Pseudochol deficiency Neg Hx   . Other Neg Hx     Social History   Tobacco Use  . Smoking status: Former Smoker    Packs/day: 0.50    Years: 2.00    Pack years: 1.00    Types: Cigarettes  . Smokeless tobacco: Never Used  Substance Use Topics  . Alcohol use: No  . Drug use: No    Types: Hydrocodone    Home Medications Prior to Admission medications   Medication Sig Start Date End Date Taking? Authorizing Provider  ferrous sulfate (FERROUSUL) 325 (65 FE) MG tablet Take 1 tablet (325 mg total) by mouth 2 (two) times daily. 04/06/16   Katrinka Blazing, [place], CNM  ketorolac (TORADOL) 10 MG tablet Take 1 tablet (10 mg total) by mouth every 6 (six) hours as needed. Patient not taking: Reported on 07/25/2016 04/06/16  Tamala Julian, [place], CNM  lidocaine (LIDODERM) 5 % Place 1 patch onto the skin daily. Remove & Discard patch within 12 hours or as directed by MD 01/13/20   Lawsyn Heiler A, PA-C  methocarbamol (ROBAXIN) 500 MG tablet Take 1 tablet (500 mg total) by mouth 2 (two) times daily. 01/13/20   Erlene Devita A, PA-C  metroNIDAZOLE (FLAGYL) 500 MG tablet Take 1 tablet (500 mg total) by mouth 2 (two) times daily. 10/02/18   Raylene Everts, MD  naproxen (NAPROSYN) 500 MG tablet Take 1 tablet (500 mg total) by mouth 2 (two) times daily. 01/13/20   Linus Weckerly A, PA-C  potassium chloride SA (K-DUR,KLOR-CON) 20 MEQ tablet Take 1 tablet (20 mEq total) by mouth 3 (three) times daily. Patient not taking: Reported on 07/25/2016 04/12/16   Tamala Julian, [place], CNM  terconazole (TERAZOL 3) 0.8 % vaginal cream Place 1 applicator vaginally at  bedtime. 09/28/18   Vanessa Kick, MD    Allergies    Patient has no known allergies.  Review of Systems   Review of Systems  Constitutional: Negative.   HENT: Negative.   Respiratory: Negative.   Cardiovascular: Negative.   Gastrointestinal: Negative.   Genitourinary: Negative.   Musculoskeletal: Negative for arthralgias, neck pain and neck stiffness.       Right leg pain  Skin: Negative.   Neurological: Negative.   All other systems reviewed and are negative.   Physical Exam Updated Vital Signs BP (!) 138/93 (BP Location: Right Arm)   Pulse 84   Temp 98.6 F (37 C) (Oral)   Resp 14   Ht 5' 2.5" (1.588 m)   Wt 99.8 kg   SpO2 99%   BMI 39.60 kg/m   Physical Exam Physical Exam  Constitutional: Pt appears well-developed and well-nourished. No distress.  HENT:  Head: Normocephalic and atraumatic.  Mouth/Throat: Oropharynx is clear and moist. No oropharyngeal exudate.  Eyes: Conjunctivae are normal.  Neck: Normal range of motion. Neck supple.  Full ROM without pain  Cardiovascular: Normal rate, regular rhythm and intact distal pulses.   Pulmonary/Chest: Effort normal and breath sounds normal. No respiratory distress. Pt has no wheezes.  Abdominal: Soft. Pt exhibits no distension. There is no tenderness, rebound or guarding. No abd bruit or pulsatile mass Musculoskeletal:  Full range of motion of the T-spine and L-spine with flexion, hyperextension, and lateral flexion. No midline tenderness or stepoffs. No tenderness to palpation of the spinous processes of the T-spine or L-spine. Moderate tenderness to palpation of the paraspinous muscles of the L-spine. Positive  straight leg raise on right at 40'.  Tenderness to right piriformis and over SI joint. Lymphadenopathy:    Pt has no cervical adenopathy.  Neurological: Pt is alert. Pt has normal reflexes.  Reflex Scores:      Bicep reflexes are 2+ on the right side and 2+ on the left side.      Brachioradialis reflexes  are 2+ on the right side and 2+ on the left side.      Patellar reflexes are 2+ on the right side and 2+ on the left side.      Achilles reflexes are 2+ on the right side and 2+ on the left side. Speech is clear and goal oriented, follows commands Normal 5/5 strength in upper and lower extremities bilaterally including dorsiflexion and plantar flexion, strong and equal grip strength Sensation normal to light and sharp touch Moves extremities without ataxia, coordination intact Normal gait Normal balance No Clonus Skin: Skin is  warm and dry. No rash noted or lesions noted. Pt is not diaphoretic. No erythema, ecchymosis,edema or warmth.   No overlying skin changes. Psychiatric: Pt has a normal mood and affect. Behavior is normal.  Nursing note and vitals reviewed. ED Results / Procedures / Treatments   Labs (all labs ordered are listed, but only abnormal results are displayed) Labs Reviewed  POC URINE PREG, ED    EKG None  Radiology No results found.  Procedures Procedures (including critical care time)  Medications Ordered in ED Medications  ketorolac (TORADOL) 15 MG/ML injection 15 mg (has no administration in time range)  lidocaine (LIDODERM) 5 % 1 patch (has no administration in time range)    ED Course  I have reviewed the triage vital signs and the nursing notes.  Pertinent labs & imaging results that were available during my care of the patient were reviewed by me and considered in my medical decision making (see chart for details).  29 year old female who is otherwise well presents for evaluation of leg pain.  Began yesterday.  She is afebrile, nonseptic, non-ill-appearing.  Patient with right posterior buttocks pain and tenderness over her right piriformis and in her SI joint.  Positive straight leg raise on right at 40 degrees. Radiates down her posterior and lateral right leg. She has no overlying skin changes.  No midline back pain, recent injury or trauma.  She  has no red flags for back pain, specifically no history of IV drug use, bowel or bladder incontinence, saddle paresthesia or malignancy.  Does have palpable spasm over her right paraspinal muscle.  Do not feel need imaging at this time.  Unsure pregnancy so we will get pregnancy test any give IM Toradol.  Pelvis stable, nontender palpation.  No shortening or rotation of legs.  Low suspicion for cauda equina, discitis, osteomyelitis, transverse myelitis, psoas abscess, septic joint, gout, hemarthrosis, DVT, myositis, compartment syndrome, cellulitis, acute bacterial infectious process.  Pregnancy test negative  Question piriformis syndrome/ sciatica, will treat as such with anti-inflammatories, lidocaine patches.  Given follow-up with orthopedics.  She may return for new worsening symptoms.  The patient has been appropriately medically screened and/or stabilized in the ED. I have low suspicion for any other emergent medical condition which would require further screening, evaluation or treatment in the ED or require inpatient management.  Patient is hemodynamically stable and in no acute distress.  Patient able to ambulate in department prior to ED.  Evaluation does not show acute pathology that would require ongoing or additional emergent interventions while in the emergency department or further inpatient treatment.  I have discussed the diagnosis with the patient and answered all questions.  Pain is been managed while in the emergency department and patient has no further complaints prior to discharge.  Patient is comfortable with plan discussed in room and is stable for discharge at this time.  I have discussed strict return precautions for returning to the emergency department.  Patient was encouraged to follow-up with PCP/specialist refer to at discharge.    MDM Rules/Calculators/A&P                        MDM Number of Diagnoses or Management Options Sciatica of right side: new, no workup     Amount and/or Complexity of Data Reviewed Clinical lab tests: reviewed Tests in the radiology section of CPT: reviewed Tests in the medicine section of CPT: reviewed Discussion of test results with the performing providers: yes Decide  to obtain previous medical records or to obtain history from someone other than the patient: yes Obtain history from someone other than the patient: yes Review and summarize past medical records: yes Discuss the patient with other providers: no Independent visualization of images, tracings, or specimens: yes  Risk of Complications, Morbidity, and/or Mortality Presenting problems: low Diagnostic procedures: low Management options: low  Patient Progress Patient progress: improved   Final Clinical Impression(s) / ED Diagnoses Final diagnoses:  Sciatica of right side    Rx / DC Orders ED Discharge Orders         Ordered    naproxen (NAPROSYN) 500 MG tablet  2 times daily     01/13/20 0945    lidocaine (LIDODERM) 5 %  Every 24 hours     01/13/20 0945    methocarbamol (ROBAXIN) 500 MG tablet  2 times daily     01/13/20 0945           Shonette Rhames A, PA-C 01/13/20 1011    Cur[place], Adam, DO 01/13/20 1539

## 2020-01-13 NOTE — Discharge Instructions (Signed)
Take the medications as prescribed.  These will take a few days to fully kick in.  If you continue to have severe pain please follow-up with your primary care provider or orthopedics.  If you develop fever, bowel or bladder incontinence, numbness to your perineal region please seek reevaluation

## 2020-01-17 ENCOUNTER — Ambulatory Visit (INDEPENDENT_AMBULATORY_CARE_PROVIDER_SITE_OTHER): Payer: Medicaid Other | Admitting: Podiatry

## 2020-01-17 ENCOUNTER — Encounter (HOSPITAL_COMMUNITY): Payer: Self-pay

## 2020-01-17 ENCOUNTER — Emergency Department (HOSPITAL_COMMUNITY)
Admission: EM | Admit: 2020-01-17 | Discharge: 2020-01-17 | Disposition: A | Discharge status: Home / Self Care | Payer: Medicaid Other | Attending: Emergency Medicine | Admitting: Emergency Medicine

## 2020-01-17 DIAGNOSIS — Z5329 Procedure and treatment not carried out because of patient's decision for other reasons: Secondary | ICD-10-CM

## 2020-01-17 DIAGNOSIS — M5431 Sciatica, right side: Secondary | ICD-10-CM

## 2020-01-17 DIAGNOSIS — Z79899 Other long term (current) drug therapy: Secondary | ICD-10-CM | POA: Diagnosis not present

## 2020-01-17 DIAGNOSIS — Z87891 Personal history of nicotine dependence: Secondary | ICD-10-CM | POA: Diagnosis not present

## 2020-01-17 DIAGNOSIS — M545 Low back pain: Secondary | ICD-10-CM | POA: Diagnosis present

## 2020-01-17 DIAGNOSIS — J45909 Unspecified asthma, uncomplicated: Secondary | ICD-10-CM | POA: Insufficient documentation

## 2020-01-17 MED ORDER — HYDROCODONE-ACETAMINOPHEN 5-325 MG PO TABS: 1.0000 | ORAL_TABLET | ORAL | 0 refills | Status: DC | PRN

## 2020-01-17 MED ORDER — KETOROLAC TROMETHAMINE 60 MG/2ML IM SOLN
30.0000 mg | Freq: Once | INTRAMUSCULAR | Status: AC
  Administered 2020-01-17: 30 mg via INTRAMUSCULAR
  Filled 2020-01-17: qty 2

## 2020-01-17 MED ORDER — PREDNISONE 20 MG PO TABS: 20.0000 mg | ORAL_TABLET | Freq: Two times a day (BID) | ORAL | 0 refills | Status: DC

## 2020-01-17 NOTE — Progress Notes (Signed)
No show for appt. 

## 2020-01-17 NOTE — ED Triage Notes (Signed)
Pt reports lower back pain, seen here this week and given robaxin and naproxen without relief. Pt ambulatory

## 2020-01-17 NOTE — ED Provider Notes (Signed)
[place] EMERGENCY DEPARTMENT Provider Note   CSN: [id number] Arrival date & time: 01/17/20  9242     History Chief Complaint  Patient presents with  . Back Pain    Kristin Powers is a 29 y.o. female.  HPI She presents for persistent right buttock pain which radiates to right lumbar region and right upper leg.  Onset several days ago.  Evaluated here for same, 4 days ago, taking Naprosyn and methocarbamol as prescribed.  She requests "a shot, like before."  She believes that she can "manage my pain after that."  No trauma.  No fever.  No chronic issues of a similar nature.  There are no other known modifying factors.    Past Medical History:  Diagnosis Date  . Abnormal Pap smear   . Acid reflux   . Asthma   . Depression   . Headache(784.0)   . Human papilloma virus   . Pregnancy induced hypertension   . Trichomonas   . Urinary tract infection   . Vaginal Pap smear, abnormal     Patient Active Problem List   Diagnosis Date Noted  . Group B Streptococcus carrier, +RV culture, currently pregnant 09/24/2015  . History of prior pregnancy with IUGR newborn 06/27/2015  . Asthma 06/27/2015  . Depression 06/27/2015  . Prior pregnancy complicated by [facility], antepartum 06/27/2015  . Obesity 06/27/2015  . Positive urine drug screen 06/27/2015    Past Surgical History:  Procedure Laterality Date  . INDUCED ABORTION  March 2013     OB History    Gravida  4   Para  3   Term  3   Preterm  0   AB  1   Living  3     SAB  0   TAB  1   Ectopic  0   Multiple  0   Live Births  3           Family History  Problem Relation Age of Onset  . Depression Mother   . Hypertension Mother   . Diabetes Mother   . Hypertension Father   . Anesthesia problems Neg Hx   . Hypotension Neg Hx   . Malignant hyperthermia Neg Hx   . Pseudochol deficiency Neg Hx   . Other Neg Hx     Social History   Tobacco Use  . Smoking status: Former Smoker   Packs/day: 0.50    Years: 2.00    Pack years: 1.00    Types: Cigarettes  . Smokeless tobacco: Never Used  Substance Use Topics  . Alcohol use: No  . Drug use: No    Types: Hydrocodone    Home Medications Prior to Admission medications   Medication Sig Start Date End Date Taking? Authorizing Provider  ferrous sulfate (FERROUSUL) 325 (65 FE) MG tablet Take 1 tablet (325 mg total) by mouth 2 (two) times daily. 04/06/16   Tamala Julian, [place], CNM  HYDROcodone-acetaminophen (NORCO/VICODIN) 5-325 MG tablet Take 1 tablet by mouth every 4 (four) hours as needed for moderate pain. 01/17/20   Daleen Bo, MD  lidocaine (LIDODERM) 5 % Place 1 patch onto the skin daily. Remove & Discard patch within 12 hours or as directed by MD 01/13/20   Henderly, Britni A, PA-C  methocarbamol (ROBAXIN) 500 MG tablet Take 1 tablet (500 mg total) by mouth 2 (two) times daily. 01/13/20   Henderly, Britni A, PA-C  metroNIDAZOLE (FLAGYL) 500 MG tablet Take 1 tablet (500 mg total) by  mouth 2 (two) times daily. 10/02/18   Eustace Moore, MD  predniSONE (DELTASONE) 20 MG tablet Take 1 tablet (20 mg total) by mouth 2 (two) times daily. 01/17/20   Mancel Bale, MD  terconazole (TERAZOL 3) 0.8 % vaginal cream Place 1 applicator vaginally at bedtime. 09/28/18   Mardella Layman, MD  potassium chloride SA (K-DUR,KLOR-CON) 20 MEQ tablet Take 1 tablet (20 mEq total) by mouth 3 (three) times daily. Patient not taking: Reported on 07/25/2016 04/12/16 01/17/20  Dorathy Kinsman, CNM    Allergies    Patient has no known allergies.  Review of Systems   Review of Systems  All other systems reviewed and are negative.   Physical Exam Updated Vital Signs BP 120/64   Pulse 90   Temp 98 F (36.7 C) (Oral)   Resp 16   Ht 5' 7.5" (1.715 m)   Wt 99.8 kg   SpO2 99%   BMI 33.95 kg/m   Physical Exam Vitals and nursing note reviewed.  Constitutional:      General: She is not in acute distress.    Appearance: She is well-developed.  She is not ill-appearing, toxic-appearing or diaphoretic.  HENT:     Head: Normocephalic and atraumatic.     Right Ear: External ear normal.     Left Ear: External ear normal.  Eyes:     Conjunctiva/sclera: Conjunctivae normal.     Pupils: Pupils are equal, round, and reactive to light.  Neck:     Trachea: Phonation normal.  Cardiovascular:     Rate and Rhythm: Normal rate.  Pulmonary:     Effort: Pulmonary effort is normal.  Musculoskeletal:        General: Normal range of motion.     Cervical back: Normal range of motion and neck supple.     Comments: Tender right lower back, with preserved normal range of motion.  Normal range of motion lower extremities.  Skin:    General: Skin is warm and dry.  Neurological:     Mental Status: She is alert and oriented to person, place, and time.     Cranial Nerves: No cranial nerve deficit.     Sensory: No sensory deficit.     Motor: No abnormal muscle tone.     Coordination: Coordination normal.  Psychiatric:        Mood and Affect: Mood normal.        Behavior: Behavior normal.        Thought Content: Thought content normal.        Judgment: Judgment normal.     ED Results / Procedures / Treatments   Labs (all labs ordered are listed, but only abnormal results are displayed) Labs Reviewed - No data to display  EKG None  Radiology No results found.  Procedures Procedures (including critical care time)  Medications Ordered in ED Medications  ketorolac (TORADOL) injection 30 mg (30 mg Intramuscular Given 01/17/20 0844)    ED Course  I have reviewed the triage vital signs and the nursing notes.  Pertinent labs & imaging results that were available during my care of the patient were reviewed by me and considered in my medical decision making (see chart for details).    MDM Rules/Calculators/A&P                       Patient Vitals for the past 24 hrs:  BP Temp Temp src Pulse Resp SpO2 Height Weight  01/17/20 0805  120/64 - -  90 16 99 % - -  01/17/20 0728 127/89 98 F (36.7 C) Oral 95 14 100 % 5' 7.5" (1.715 m) 99.8 kg    8:55 AM Reevaluation with update and discussion. After initial assessment and treatment, an updated evaluation reveals no change in clinical status, findings discussed with patient and all questions were answered. Mancel Bale   Medical Decision Making:  This patient is presenting for evaluation of partially treated sciatica, which does require a range of treatment options, and is a complaint that involves a low risk of morbidity and mortality. The differential diagnoses include muscle strain, sciatica, lumbar radiculopathy. I decided  to review old records, and in summary healthy, overweight female, with benign prior history relative to musculoskeletal/spinal disorders. I did not additional historical information from anyone.   Critical Interventions-clinical evaluation  After These Interventions, the Patient was reevaluated and was found stable for discharge.  She has what appears to be right sided sciatica, without worrisome or complicating features.  Recently treated with Naprosyn, methocarbamol and lidocaine patches with partial improvement.  No indication for further interventions or imaging at this time.  CRITICAL CARE-no  Nursing Notes Reviewed/ Care Coordinated Applicable Imaging Reviewed Interpretation of Laboratory Data incorporated into ED treatment  The patient appears reasonably screened and/or stabilized for discharge and I doubt any other medical condition or other [facility] requiring further screening, evaluation, or treatment in the ED at this time prior to discharge.  Plan: Home Medications-hold Naprosyn while on prednisone; Home Treatments-heat to affected area; return here if the recommended treatment, does not improve the symptoms; Recommended follow up-PCP, as needed  Performed by: Mancel Bale   Final Clinical Impression(s) / ED Diagnoses Final diagnoses:   Sciatica of right side    Rx / DC Orders ED Discharge Orders         Ordered    HYDROcodone-acetaminophen (NORCO/VICODIN) 5-325 MG tablet  Every 4 hours PRN     01/17/20 0828    predniSONE (DELTASONE) 20 MG tablet  2 times daily     01/17/20 7209           Mancel Bale, MD 01/17/20 [phone]

## 2020-01-17 NOTE — ED Notes (Signed)
Pt d/c home per MD order. Discharge summary reviewed, pt verbalizes understanding. Ambulatory off unit. No s/s of acute distress noted.  

## 2020-01-17 NOTE — Discharge Instructions (Signed)
Sciatica usually takes a couple weeks to improve.  Make sure you rest and try to gently stretch out your back and right leg.  Use heat on the sore areas 3-4 times a day to improve your discomfort.  Stop taking the naproxen, while taking the prednisone.  You can continue the other previously prescribed medications.  Follow-up with your doctor as needed for problems.

## 2020-02-21 ENCOUNTER — Other Ambulatory Visit: Payer: Self-pay

## 2020-02-21 ENCOUNTER — Ambulatory Visit: Payer: Medicaid Other | Admitting: Podiatry

## 2020-02-21 DIAGNOSIS — M79676 Pain in unspecified toe(s): Secondary | ICD-10-CM

## 2020-02-21 DIAGNOSIS — B351 Tinea unguium: Secondary | ICD-10-CM | POA: Diagnosis not present

## 2020-02-21 MED ORDER — FLUCONAZOLE 150 MG PO TABS: 150.0000 mg | ORAL_TABLET | ORAL | 1 refills | Status: DC

## 2020-02-21 NOTE — Patient Instructions (Signed)

## 2020-03-14 ENCOUNTER — Ambulatory Visit: Payer: Medicaid Other | Admitting: Podiatry

## 2020-03-14 ENCOUNTER — Telehealth: Payer: Self-pay | Admitting: *Deleted

## 2020-03-14 NOTE — Telephone Encounter (Addendum)
Patient is calling to request pain medicine to get thru weekend. Her ankle and heel started to hurt when she got up today.

## 2020-03-14 NOTE — Telephone Encounter (Signed)
Left message informing pt, I had reviewed her clinicals and we had not seen her for foot pain, that she should rest, and ice the foot 3-4 times daily protecting the skin from the ice with a light cloth.

## 2020-03-14 NOTE — Telephone Encounter (Signed)
Pt states she was seen for foot pain and would like something to help and will rest, and ice.

## 2020-04-04 ENCOUNTER — Ambulatory Visit: Payer: Medicaid Other | Admitting: Podiatry

## 2020-04-06 NOTE — Progress Notes (Signed)
  Subjective:  Patient ID: Kristin Powers, female    DOB: 06-02-1991,  MRN: [id number]  Chief Complaint  Patient presents with  . Nail Problem    pt states that she is looking to get a toenail removal of the left great toenail medial side, pt states that the nail is painful to the medial side, pt also states that there has been no pus/ drainage in the foot    29 y.o. female presents with the above complaint. History confirmed with patient.   Objective:  Physical Exam: warm, good capillary refill, no trophic changes or ulcerative lesions, normal DP and PT pulses and normal sensory exam. Nails thickened dystrophic brown discoloration Assessment:   1. Pain due to onychomycosis of nail      Plan:  Patient was evaluated and treated and all questions answered.  Onychomycosis -Educated on etiology of nail fungus. -Rx fluconazole weekly.  Return in about 6 weeks (around 04/03/2020).

## 2020-04-11 ENCOUNTER — Ambulatory Visit (INDEPENDENT_AMBULATORY_CARE_PROVIDER_SITE_OTHER): Payer: Medicaid Other | Admitting: Podiatry

## 2020-04-11 DIAGNOSIS — Z5329 Procedure and treatment not carried out because of patient's decision for other reasons: Secondary | ICD-10-CM

## 2020-04-11 NOTE — Progress Notes (Signed)
No show for appt. Third no show.

## 2020-04-14 ENCOUNTER — Telehealth: Payer: Self-pay | Admitting: Podiatry

## 2020-04-14 NOTE — Telephone Encounter (Signed)
Pt called and wanted to know if she can receive something for pain

## 2020-04-14 NOTE — Telephone Encounter (Signed)
I spoke with pt and told her Dr. Samuella Cota wanted to see her prior to ordering pain medication. Pt states she was suppose to get something for the pain in her toenails. I told pt she needed to be seen and I transferred to the scheduler.

## 2020-05-02 ENCOUNTER — Other Ambulatory Visit: Payer: Self-pay | Admitting: Podiatry

## 2020-05-02 ENCOUNTER — Ambulatory Visit (INDEPENDENT_AMBULATORY_CARE_PROVIDER_SITE_OTHER): Payer: Medicaid Other

## 2020-05-02 ENCOUNTER — Ambulatory Visit: Payer: Medicaid Other | Admitting: Podiatry

## 2020-05-02 ENCOUNTER — Other Ambulatory Visit: Payer: Self-pay

## 2020-05-02 DIAGNOSIS — M722 Plantar fascial fibromatosis: Secondary | ICD-10-CM

## 2020-05-02 DIAGNOSIS — M216X2 Other acquired deformities of left foot: Secondary | ICD-10-CM | POA: Diagnosis not present

## 2020-05-02 DIAGNOSIS — M79672 Pain in left foot: Secondary | ICD-10-CM

## 2020-05-02 DIAGNOSIS — M7732 Calcaneal spur, left foot: Secondary | ICD-10-CM

## 2020-05-02 MED ORDER — MELOXICAM 15 MG PO TABS: 15.0000 mg | ORAL_TABLET | Freq: Every day | ORAL | 0 refills | Status: DC

## 2020-05-02 MED ORDER — METHYLPREDNISOLONE 4 MG PO TBPK: ORAL_TABLET | ORAL | 0 refills | Status: DC

## 2020-05-02 MED ORDER — CYCLOBENZAPRINE HCL 10 MG PO TABS: 10.0000 mg | ORAL_TABLET | Freq: Three times a day (TID) | ORAL | 0 refills | Status: DC | PRN

## 2020-05-06 ENCOUNTER — Encounter: Payer: Self-pay | Admitting: Podiatry

## 2020-05-06 NOTE — Progress Notes (Signed)
Subjective:  Patient ID: Kristin Powers, female    DOB: May 01, 1991,  MRN: [id number]  Chief Complaint  Patient presents with  . Foot Pain    pt is here for right foot bottom/ back of heel. Pt states that the pain has been going on for about a year. Pt states that the pain is elevated when she is not walking on it.    29 y.o. female presents with the above complaint.  Patient presents with a new complaint of right heel pain that has been going on for quite some time.  Patient states is hurting in the bottom and the back of the heel.  Patient states is painful to walk on.  Is been going on for about a year.  Patient states is worse when getting out of the bed and taking the first.  Patient states there is burning sensation.  Pain is elevated when walking on it.  Patient tried Epson salts but has not helped much.  She denies seeing anyone else for this.  She denies any other acute treatment options.   Review of Systems: Negative except as noted in the HPI. Denies N/V/F/Ch.  Past Medical History:  Diagnosis Date  . Abnormal Pap smear   . Acid reflux   . Asthma   . Depression   . Headache(784.0)   . Human papilloma virus   . Pregnancy induced hypertension   . Trichomonas   . Urinary tract infection   . Vaginal Pap smear, abnormal     Current Outpatient Medications:  .  ferrous sulfate (FERROUSUL) 325 (65 FE) MG tablet, Take 1 tablet (325 mg total) by mouth 2 (two) times daily., Disp: 60 tablet, Rfl: 1 .  fluconazole (DIFLUCAN) 150 MG tablet, Take 1 tablet (150 mg total) by mouth once a week., Disp: 6 tablet, Rfl: 1 .  HYDROcodone-acetaminophen (NORCO/VICODIN) 5-325 MG tablet, Take 1 tablet by mouth every 4 (four) hours as needed for moderate pain., Disp: 12 tablet, Rfl: 0 .  lidocaine (LIDODERM) 5 %, Place 1 patch onto the skin daily. Remove & Discard patch within 12 hours or as directed by MD, Disp: 30 patch, Rfl: 0 .  methocarbamol (ROBAXIN) 500 MG tablet, Take 1 tablet (500 mg total)  by mouth 2 (two) times daily., Disp: 20 tablet, Rfl: 0 .  metroNIDAZOLE (FLAGYL) 500 MG tablet, Take 1 tablet (500 mg total) by mouth 2 (two) times daily., Disp: 14 tablet, Rfl: 0 .  predniSONE (DELTASONE) 20 MG tablet, Take 1 tablet (20 mg total) by mouth 2 (two) times daily., Disp: 10 tablet, Rfl: 0 .  terconazole (TERAZOL 3) 0.8 % vaginal cream, Place 1 applicator vaginally at bedtime., Disp: 20 g, Rfl: 0 .  cyclobenzaprine (FLEXERIL) 10 MG tablet, Take 1 tablet (10 mg total) by mouth 3 (three) times daily as needed for muscle spasms., Disp: 30 tablet, Rfl: 0 .  meloxicam (MOBIC) 15 MG tablet, Take 1 tablet (15 mg total) by mouth daily., Disp: 30 tablet, Rfl: 0 .  methylPREDNISolone (MEDROL DOSEPAK) 4 MG TBPK tablet, Use as directed, Disp: 1 each, Rfl: 0  Social History   Tobacco Use  Smoking Status Former Smoker  . Packs/day: 0.50  . Years: 2.00  . Pack years: 1.00  . Types: Cigarettes  Smokeless Tobacco Never Used    No Known Allergies Objective:  There were no vitals filed for this visit. There is no height or weight on file to calculate BMI. Constitutional Well developed. Well nourished.  Vascular Dorsalis  pedis pulses palpable bilaterally. Posterior tibial pulses palpable bilaterally. Capillary refill normal to all digits.  No cyanosis or clubbing noted. Pedal hair growth normal.  Neurologic Normal speech. Oriented to person, place, and time. Epicritic sensation to light touch grossly present bilaterally.  Dermatologic Nails well groomed and normal in appearance. No open wounds. No skin lesions.  Orthopedic: Normal joint ROM without pain or crepitus bilaterally. No visible deformities. Tender to palpation at the calcaneal tuber left No pain with calcaneal squeeze left Ankle ROM diminished range of motion left Silfverskiold Test: positive left   Radiographs: Taken and reviewed. No acute fractures or dislocations. No evidence of stress fracture.  Plantar heel spur  present. Posterior heel spur absent.   Assessment:   1. Foot pain, left    Plan:  Patient was evaluated and treated and all questions answered.  Plantar Fasciitis, left with underlying heel spur - XR reviewed as above.  - Educated on icing and stretching. Instructions given.  - Injection delivered to the plantar fascia as below. - DME: None - Pharmacologic management: Meloxicam/Medrol Dose Pak/Flexeril. Educated on risks/benefits and proper taking of medication.  Procedure: Injection Tendon/Ligament Location: Left plantar fascia at the glabrous junction; medial approach. Skin Prep: alcohol Injectate: 0.5 cc 0.5% marcaine plain, 0.5 cc of 1% Lidocaine, 0.5 cc kenalog 10. Disposition: Patient tolerated procedure well. Injection site dressed with a band-aid.  No follow-ups on file.

## 2020-05-13 ENCOUNTER — Ambulatory Visit: Payer: Medicaid Other | Admitting: Podiatry

## 2020-05-30 ENCOUNTER — Other Ambulatory Visit: Payer: Self-pay

## 2020-05-30 ENCOUNTER — Ambulatory Visit (INDEPENDENT_AMBULATORY_CARE_PROVIDER_SITE_OTHER): Payer: Medicaid Other | Admitting: Podiatry

## 2020-05-30 DIAGNOSIS — M722 Plantar fascial fibromatosis: Secondary | ICD-10-CM | POA: Diagnosis not present

## 2020-05-30 DIAGNOSIS — M7732 Calcaneal spur, left foot: Secondary | ICD-10-CM

## 2020-05-30 DIAGNOSIS — L600 Ingrowing nail: Secondary | ICD-10-CM | POA: Diagnosis not present

## 2020-05-30 DIAGNOSIS — L603 Nail dystrophy: Secondary | ICD-10-CM

## 2020-06-02 ENCOUNTER — Telehealth: Payer: Self-pay | Admitting: Podiatry

## 2020-06-02 NOTE — Telephone Encounter (Signed)
Pt wanted to know if you were supposed to get a cream on to put on her toe where the nail was removed. She also wanted to know what she can take for the swelling.

## 2020-06-02 NOTE — Telephone Encounter (Signed)
She can just apply triple antibiotic and a Band-Aid.  Unfortunately there is nothing to take for swelling however she can aggressively elevate and ice the area to help with the swelling.

## 2020-06-03 ENCOUNTER — Other Ambulatory Visit: Payer: Self-pay

## 2020-06-03 ENCOUNTER — Encounter (HOSPITAL_COMMUNITY): Payer: Self-pay

## 2020-06-03 ENCOUNTER — Emergency Department (HOSPITAL_COMMUNITY)
Admission: EM | Admit: 2020-06-03 | Discharge: 2020-06-03 | Disposition: A | Discharge status: Home / Self Care | Payer: Medicaid Other | Attending: Emergency Medicine | Admitting: Emergency Medicine

## 2020-06-03 ENCOUNTER — Encounter: Payer: Self-pay | Admitting: Podiatry

## 2020-06-03 DIAGNOSIS — K029 Dental caries, unspecified: Secondary | ICD-10-CM | POA: Diagnosis not present

## 2020-06-03 DIAGNOSIS — Z87891 Personal history of nicotine dependence: Secondary | ICD-10-CM | POA: Insufficient documentation

## 2020-06-03 DIAGNOSIS — K0889 Other specified disorders of teeth and supporting structures: Secondary | ICD-10-CM | POA: Insufficient documentation

## 2020-06-03 DIAGNOSIS — J45909 Unspecified asthma, uncomplicated: Secondary | ICD-10-CM | POA: Insufficient documentation

## 2020-06-03 MED ORDER — IBUPROFEN 800 MG PO TABS: 800.0000 mg | ORAL_TABLET | Freq: Four times a day (QID) | ORAL | 1 refills | Status: AC | PRN

## 2020-06-03 MED ORDER — OXYCODONE-ACETAMINOPHEN 5-325 MG PO TABS
1.0000 | ORAL_TABLET | Freq: Once | ORAL | Status: AC
  Administered 2020-06-03: 1 via ORAL
  Filled 2020-06-03: qty 1

## 2020-06-03 MED ORDER — CYCLOBENZAPRINE HCL 10 MG PO TABS: 10.0000 mg | ORAL_TABLET | Freq: Three times a day (TID) | ORAL | 0 refills | Status: AC | PRN

## 2020-06-03 NOTE — Telephone Encounter (Signed)
Called patient and gave her instructions. Patient states she has been to the ED because gauze was stuck to toe. She is requesting prescription for  Ibuprofen 800 mg because she is taking several OTC ibuprofen at one time for pain. Pharmacy is [facility] on [address]. She would also like you to give her a call to clarify your soaking instructions. She also states she has no follow up appointment. Would you like to reappoint?

## 2020-06-03 NOTE — Progress Notes (Signed)
Subjective:  Patient ID: Kristin Powers, female    DOB: 08/09/1991,  MRN: [id number]  Chief Complaint  Patient presents with  . Wound Check    4wk f/u- pt state she is requesting an additional injection today- helped majorly w pain   . Ingrown Toenail    possible ingrown lt great toenail- further evaluation     29 y.o. female presents with the above complaint.  Patient presents with a follow-up of left plantar fasciitis.  Patient states that injection has helped a lot.  She states that it she is 80% improved.  She would like another injection get her closer to 100%.  She also has secondary complaint of left hallux nail dystrophy that has been causing her a lot of pain.  She would like to have it removed as well as made the ingrown portion of the nail permanent on both medial and lateral side.  She has not had this done in the past.  She denies any other acute complaints.   Review of Systems: Negative except as noted in the HPI. Denies N/V/F/Ch.  Past Medical History:  Diagnosis Date  . Abnormal Pap smear   . Acid reflux   . Asthma   . Depression   . Headache(784.0)   . Human papilloma virus   . Pregnancy induced hypertension   . Trichomonas   . Urinary tract infection   . Vaginal Pap smear, abnormal     Current Outpatient Medications:  .  cyclobenzaprine (FLEXERIL) 10 MG tablet, Take 1 tablet (10 mg total) by mouth 3 (three) times daily as needed for muscle spasms., Disp: 30 tablet, Rfl: 0 .  ferrous sulfate (FERROUSUL) 325 (65 FE) MG tablet, Take 1 tablet (325 mg total) by mouth 2 (two) times daily., Disp: 60 tablet, Rfl: 1 .  fluconazole (DIFLUCAN) 150 MG tablet, Take 1 tablet (150 mg total) by mouth once a week., Disp: 6 tablet, Rfl: 1 .  HYDROcodone-acetaminophen (NORCO/VICODIN) 5-325 MG tablet, Take 1 tablet by mouth every 4 (four) hours as needed for moderate pain., Disp: 12 tablet, Rfl: 0 .  lidocaine (LIDODERM) 5 %, Place 1 patch onto the skin daily. Remove & Discard  patch within 12 hours or as directed by MD, Disp: 30 patch, Rfl: 0 .  meloxicam (MOBIC) 15 MG tablet, Take 1 tablet (15 mg total) by mouth daily., Disp: 30 tablet, Rfl: 0 .  methocarbamol (ROBAXIN) 500 MG tablet, Take 1 tablet (500 mg total) by mouth 2 (two) times daily., Disp: 20 tablet, Rfl: 0 .  methylPREDNISolone (MEDROL DOSEPAK) 4 MG TBPK tablet, Use as directed, Disp: 1 each, Rfl: 0 .  metroNIDAZOLE (FLAGYL) 500 MG tablet, Take 1 tablet (500 mg total) by mouth 2 (two) times daily., Disp: 14 tablet, Rfl: 0 .  predniSONE (DELTASONE) 20 MG tablet, Take 1 tablet (20 mg total) by mouth 2 (two) times daily., Disp: 10 tablet, Rfl: 0 .  terconazole (TERAZOL 3) 0.8 % vaginal cream, Place 1 applicator vaginally at bedtime., Disp: 20 g, Rfl: 0  Social History   Tobacco Use  Smoking Status Former Smoker  . Packs/day: 0.50  . Years: 2.00  . Pack years: 1.00  . Types: Cigarettes  Smokeless Tobacco Never Used    No Known Allergies Objective:  There were no vitals filed for this visit. There is no height or weight on file to calculate BMI. Constitutional Well developed. Well nourished.  Vascular Dorsalis pedis pulses palpable bilaterally. Posterior tibial pulses palpable bilaterally. Capillary refill normal  to all digits.  No cyanosis or clubbing noted. Pedal hair growth normal.  Neurologic Normal speech. Oriented to person, place, and time. Epicritic sensation to light touch grossly present bilaterally.  Dermatologic Pain on palpation of the entire/total nail on 1st digit of the left No open wounds. No skin lesions.  Orthopedic: Normal joint ROM without pain or crepitus bilaterally. No visible deformities. Mild tender to palpation at the calcaneal tuber left No pain with calcaneal squeeze left Ankle ROM diminished range of motion left Silfverskiold Test: positive left   Radiographs: Taken and reviewed. No acute fractures or dislocations. No evidence of stress fracture.  Plantar heel  spur present. Posterior heel spur absent.   Assessment:   1. Plantar fasciitis, left   2. Nail dystrophy   3. Heel spur, left   4. Ingrown left big toenail    Plan:  Patient was evaluated and treated and all questions answered.  Plantar Fasciitis, left with underlying heel spur - XR reviewed as above.  - Educated on icing and stretching. Instructions given.  -Second injection delivered to the plantar fascia as below. - DME: None - Pharmacologic management: None  Procedure: Injection Tendon/Ligament Location: Left plantar fascia at the glabrous junction; medial approach. Skin Prep: alcohol Injectate: 0.5 cc 0.5% marcaine plain, 0.5 cc of 1% Lidocaine, 0.5 cc kenalog 10. Disposition: Patient tolerated procedure well. Injection site dressed with a band-aid.  Nail contusion/dystrophy hallux, left  -Patient elects to proceed with minor surgery to remove entire toenail today. Consent reviewed and signed by patient. -Entire/total nail excised. See procedure note. -Educated on post-procedure care including soaking. Written instructions provided and reviewed. -Patient to follow up in 2 weeks for nail check.  Procedure: Excision of entire/total nail with phenol matricectomy Location: Left 1st toe digit Anesthesia: Lidocaine 1% plain; 1.5 mL and Marcaine 0.5% plain; 1.5 mL, digital block. Skin Prep: Betadine. Dressing: Silvadene; telfa; dry, sterile, compression dressing. Technique: Following skin prep, the toe was exsanguinated and a tourniquet was secured at the base of the toe. The affected nail border was freed and excised. The tourniquet was then removed and sterile dressing applied. Disposition: Patient tolerated procedure well. Patient to return in 2 weeks for follow-up.   No follow-ups on file. No follow-ups on file.

## 2020-06-03 NOTE — ED Notes (Signed)
Patient Alert and oriented to baseline. Stable and ambulatory to baseline. Patient verbalized understanding of the discharge instructions.  Patient belongings were taken by the patient.   

## 2020-06-03 NOTE — Addendum Note (Signed)
Addended by: Nicholes Rough on: 06/03/2020 12:10 PM   Modules accepted: Orders

## 2020-06-03 NOTE — ED Provider Notes (Signed)
[facility] EMERGENCY DEPARTMENT Provider Note   CSN: [id number] Arrival date & time: 06/03/20  2202     History No chief complaint on file.   Kristin Powers is a 29 y.o. female.  The history is provided by the patient. No language interpreter was used.   Kristin Powers is a 29 y.o. female who presents to the Emergency Department complaining of pain. She presents the emergency department complaining of 3 to 4 months of right sided dental pain. She states that she is had pain in her right upper back tooth as well as her right lower to back teeth. She has taken ibuprofen, Tylenol, cold compresses, warm compresses with no improvement in pain. She has seen her Medicaid dentist twice so far and is told that she cannot follow-up until late September to have her teeth pulled. She states that she has severe pain despite these medications and comes in seeking assistance. She has been on amoxicillin for nearly 2 months.    Past Medical History:  Diagnosis Date  . Abnormal Pap smear   . Acid reflux   . Asthma   . Depression   . Headache(784.0)   . Human papilloma virus   . Pregnancy induced hypertension   . Trichomonas   . Urinary tract infection   . Vaginal Pap smear, abnormal     Patient Active Problem List   Diagnosis Date Noted  . Group B Streptococcus carrier, +RV culture, currently pregnant 09/24/2015  . History of prior pregnancy with IUGR newborn 06/27/2015  . Asthma 06/27/2015  . Depression 06/27/2015  . Prior pregnancy complicated by [facility], antepartum 06/27/2015  . Obesity 06/27/2015  . Positive urine drug screen 06/27/2015    Past Surgical History:  Procedure Laterality Date  . INDUCED ABORTION  March 2013     OB History    Gravida  4   Para  3   Term  3   Preterm  0   AB  1   Living  3     SAB  0   TAB  1   Ectopic  0   Multiple  0   Live Births  3           Family History  Problem Relation Age of Onset  . Depression  Mother   . Hypertension Mother   . Diabetes Mother   . Hypertension Father   . Anesthesia problems Neg Hx   . Hypotension Neg Hx   . Malignant hyperthermia Neg Hx   . Pseudochol deficiency Neg Hx   . Other Neg Hx     Social History   Tobacco Use  . Smoking status: Former Smoker    Packs/day: 0.50    Years: 2.00    Pack years: 1.00    Types: Cigarettes  . Smokeless tobacco: Never Used  Substance Use Topics  . Alcohol use: No  . Drug use: No    Types: Hydrocodone    Home Medications Prior to Admission medications   Medication Sig Start Date End Date Taking? Authorizing Provider  cyclobenzaprine (FLEXERIL) 10 MG tablet Take 1 tablet (10 mg total) by mouth 3 (three) times daily as needed for muscle spasms. 05/02/20   Candelaria Stagers, DPM  ferrous sulfate (FERROUSUL) 325 (65 FE) MG tablet Take 1 tablet (325 mg total) by mouth 2 (two) times daily. 04/06/16   Katrinka Blazing, [place], CNM  fluconazole (DIFLUCAN) 150 MG tablet Take 1 tablet (150 mg total) by mouth once  a week. 02/21/20   Park Liter, DPM  HYDROcodone-acetaminophen (NORCO/VICODIN) 5-325 MG tablet Take 1 tablet by mouth every 4 (four) hours as needed for moderate pain. 01/17/20   Mancel Bale, MD  lidocaine (LIDODERM) 5 % Place 1 patch onto the skin daily. Remove & Discard patch within 12 hours or as directed by MD 01/13/20   Henderly, Britni A, PA-C  meloxicam (MOBIC) 15 MG tablet Take 1 tablet (15 mg total) by mouth daily. 05/02/20   Candelaria Stagers, DPM  methocarbamol (ROBAXIN) 500 MG tablet Take 1 tablet (500 mg total) by mouth 2 (two) times daily. 01/13/20   Henderly, Britni A, PA-C  methylPREDNISolone (MEDROL DOSEPAK) 4 MG TBPK tablet Use as directed 05/02/20   Candelaria Stagers, DPM  metroNIDAZOLE (FLAGYL) 500 MG tablet Take 1 tablet (500 mg total) by mouth 2 (two) times daily. 10/02/18   Eustace Moore, MD  predniSONE (DELTASONE) 20 MG tablet Take 1 tablet (20 mg total) by mouth 2 (two) times daily. 01/17/20   Mancel Bale,  MD  terconazole (TERAZOL 3) 0.8 % vaginal cream Place 1 applicator vaginally at bedtime. 09/28/18   Mardella Layman, MD  potassium chloride SA (K-DUR,KLOR-CON) 20 MEQ tablet Take 1 tablet (20 mEq total) by mouth 3 (three) times daily. Patient not taking: Reported on 07/25/2016 04/12/16 01/17/20  Dorathy Kinsman, CNM    Allergies    Patient has no known allergies.  Review of Systems   Review of Systems  All other systems reviewed and are negative.   Physical Exam Updated Vital Signs BP (!) 141/112 (BP Location: Left Arm)   Pulse 83   Temp 99.1 F (37.3 C) (Oral)   Resp 18   Ht 5' 2.5" (1.588 m)   Wt 96.2 kg   SpO2 99%   BMI 38.16 kg/m   Physical Exam Vitals and nursing note reviewed.  Constitutional:      Appearance: She is well-developed.  HENT:     Head: Normocephalic and atraumatic.     Comments: TMs clear bilaterally. There is a single cavity on tooth 32. There is no surrounding edema or erythema. Cardiovascular:     Rate and Rhythm: Normal rate and regular rhythm.     Heart sounds: No murmur heard.   Pulmonary:     Effort: Pulmonary effort is normal. No respiratory distress.     Breath sounds: Normal breath sounds.  Musculoskeletal:        General: No tenderness.  Skin:    General: Skin is warm and dry.  Neurological:     Mental Status: She is alert and oriented to person, place, and time.  Psychiatric:        Behavior: Behavior normal.     ED Results / Procedures / Treatments   Labs (all labs ordered are listed, but only abnormal results are displayed) Labs Reviewed - No data to display  EKG None  Radiology No results found.  Procedures Procedures (including critical care time)  Medications Ordered in ED Medications  oxyCODONE-acetaminophen (PERCOCET/ROXICET) 5-325 MG per tablet 1 tablet (has no administration in time range)    ED Course  I have reviewed the triage vital signs and the nursing notes.  Pertinent labs & imaging results that were  available during my care of the patient were reviewed by me and considered in my medical decision making (see chart for details).    MDM Rules/Calculators/A&P  Patient here for evaluation of recurrent dental pain. She is non-toxic appearing on evaluation with no external evidence of infection. She is frustrated due to ongoing pain and having her dental appointments so far out. Offered patient resources for dentistry follow-up. Discussed ibuprofen over-the-counter. Will provide one-time dose of Percocet for pain control in the emergency department.  Final Clinical Impression(s) / ED Diagnoses Final diagnoses:  Pain due to dental caries    Rx / DC Orders ED Discharge Orders    None       Tilden Fossa, MD 06/03/20 [phone]

## 2020-06-03 NOTE — ED Triage Notes (Signed)
Patient complains of 3-4 months of right sided dental pain. States that she has seen the dentist multiple times and only prescribe antibiotics. Patient states cant take the pain anymore

## 2020-06-10 DIAGNOSIS — F315 Bipolar disorder, current episode depressed, severe, with psychotic features: Secondary | ICD-10-CM | POA: Diagnosis not present

## 2020-06-17 DIAGNOSIS — F315 Bipolar disorder, current episode depressed, severe, with psychotic features: Secondary | ICD-10-CM | POA: Diagnosis not present

## 2020-07-01 DIAGNOSIS — F315 Bipolar disorder, current episode depressed, severe, with psychotic features: Secondary | ICD-10-CM | POA: Diagnosis not present

## 2020-07-08 DIAGNOSIS — F315 Bipolar disorder, current episode depressed, severe, with psychotic features: Secondary | ICD-10-CM | POA: Diagnosis not present

## 2020-07-17 DIAGNOSIS — F315 Bipolar disorder, current episode depressed, severe, with psychotic features: Secondary | ICD-10-CM | POA: Diagnosis not present

## 2020-07-24 DIAGNOSIS — F315 Bipolar disorder, current episode depressed, severe, with psychotic features: Secondary | ICD-10-CM | POA: Diagnosis not present

## 2020-07-31 DIAGNOSIS — F315 Bipolar disorder, current episode depressed, severe, with psychotic features: Secondary | ICD-10-CM | POA: Diagnosis not present

## 2020-08-20 DIAGNOSIS — F315 Bipolar disorder, current episode depressed, severe, with psychotic features: Secondary | ICD-10-CM | POA: Diagnosis not present

## 2020-08-25 DIAGNOSIS — F315 Bipolar disorder, current episode depressed, severe, with psychotic features: Secondary | ICD-10-CM | POA: Diagnosis not present

## 2020-09-10 DIAGNOSIS — F315 Bipolar disorder, current episode depressed, severe, with psychotic features: Secondary | ICD-10-CM | POA: Diagnosis not present

## 2020-09-17 DIAGNOSIS — F315 Bipolar disorder, current episode depressed, severe, with psychotic features: Secondary | ICD-10-CM | POA: Diagnosis not present

## 2020-10-01 DIAGNOSIS — F315 Bipolar disorder, current episode depressed, severe, with psychotic features: Secondary | ICD-10-CM | POA: Diagnosis not present

## 2020-10-08 DIAGNOSIS — F315 Bipolar disorder, current episode depressed, severe, with psychotic features: Secondary | ICD-10-CM | POA: Diagnosis not present

## 2020-10-15 DIAGNOSIS — F315 Bipolar disorder, current episode depressed, severe, with psychotic features: Secondary | ICD-10-CM | POA: Diagnosis not present

## 2020-10-22 DIAGNOSIS — F315 Bipolar disorder, current episode depressed, severe, with psychotic features: Secondary | ICD-10-CM | POA: Diagnosis not present

## 2020-10-24 DIAGNOSIS — F315 Bipolar disorder, current episode depressed, severe, with psychotic features: Secondary | ICD-10-CM | POA: Diagnosis not present

## 2020-10-29 DIAGNOSIS — F315 Bipolar disorder, current episode depressed, severe, with psychotic features: Secondary | ICD-10-CM | POA: Diagnosis not present

## 2020-11-06 DIAGNOSIS — F315 Bipolar disorder, current episode depressed, severe, with psychotic features: Secondary | ICD-10-CM | POA: Diagnosis not present

## 2020-11-10 DIAGNOSIS — F315 Bipolar disorder, current episode depressed, severe, with psychotic features: Secondary | ICD-10-CM | POA: Diagnosis not present

## 2020-11-12 DIAGNOSIS — F315 Bipolar disorder, current episode depressed, severe, with psychotic features: Secondary | ICD-10-CM | POA: Diagnosis not present

## 2020-11-19 DIAGNOSIS — F315 Bipolar disorder, current episode depressed, severe, with psychotic features: Secondary | ICD-10-CM | POA: Diagnosis not present

## 2020-11-26 DIAGNOSIS — F315 Bipolar disorder, current episode depressed, severe, with psychotic features: Secondary | ICD-10-CM | POA: Diagnosis not present

## 2020-12-03 DIAGNOSIS — F315 Bipolar disorder, current episode depressed, severe, with psychotic features: Secondary | ICD-10-CM | POA: Diagnosis not present

## 2020-12-19 DIAGNOSIS — F315 Bipolar disorder, current episode depressed, severe, with psychotic features: Secondary | ICD-10-CM | POA: Diagnosis not present

## 2020-12-23 DIAGNOSIS — F315 Bipolar disorder, current episode depressed, severe, with psychotic features: Secondary | ICD-10-CM | POA: Diagnosis not present

## 2020-12-26 DIAGNOSIS — F315 Bipolar disorder, current episode depressed, severe, with psychotic features: Secondary | ICD-10-CM | POA: Diagnosis not present

## 2021-02-12 DIAGNOSIS — H538 Other visual disturbances: Secondary | ICD-10-CM | POA: Diagnosis not present

## 2021-02-12 DIAGNOSIS — F315 Bipolar disorder, current episode depressed, severe, with psychotic features: Secondary | ICD-10-CM | POA: Diagnosis not present

## 2021-02-18 DIAGNOSIS — F315 Bipolar disorder, current episode depressed, severe, with psychotic features: Secondary | ICD-10-CM | POA: Diagnosis not present

## 2021-02-19 DIAGNOSIS — H5213 Myopia, bilateral: Secondary | ICD-10-CM | POA: Diagnosis not present

## 2021-02-25 DIAGNOSIS — F315 Bipolar disorder, current episode depressed, severe, with psychotic features: Secondary | ICD-10-CM | POA: Diagnosis not present

## 2021-03-04 DIAGNOSIS — H5213 Myopia, bilateral: Secondary | ICD-10-CM | POA: Diagnosis not present

## 2021-03-10 DIAGNOSIS — F315 Bipolar disorder, current episode depressed, severe, with psychotic features: Secondary | ICD-10-CM | POA: Diagnosis not present

## 2021-03-17 DIAGNOSIS — F315 Bipolar disorder, current episode depressed, severe, with psychotic features: Secondary | ICD-10-CM | POA: Diagnosis not present

## 2021-03-25 DIAGNOSIS — F315 Bipolar disorder, current episode depressed, severe, with psychotic features: Secondary | ICD-10-CM | POA: Diagnosis not present

## 2021-04-01 DIAGNOSIS — F315 Bipolar disorder, current episode depressed, severe, with psychotic features: Secondary | ICD-10-CM | POA: Diagnosis not present

## 2021-04-08 DIAGNOSIS — F315 Bipolar disorder, current episode depressed, severe, with psychotic features: Secondary | ICD-10-CM | POA: Diagnosis not present

## 2021-04-15 DIAGNOSIS — F315 Bipolar disorder, current episode depressed, severe, with psychotic features: Secondary | ICD-10-CM | POA: Diagnosis not present

## 2021-04-20 DIAGNOSIS — F315 Bipolar disorder, current episode depressed, severe, with psychotic features: Secondary | ICD-10-CM | POA: Diagnosis not present

## 2021-04-29 DIAGNOSIS — F315 Bipolar disorder, current episode depressed, severe, with psychotic features: Secondary | ICD-10-CM | POA: Diagnosis not present

## 2021-05-06 DIAGNOSIS — F315 Bipolar disorder, current episode depressed, severe, with psychotic features: Secondary | ICD-10-CM | POA: Diagnosis not present

## 2021-05-13 DIAGNOSIS — F315 Bipolar disorder, current episode depressed, severe, with psychotic features: Secondary | ICD-10-CM | POA: Diagnosis not present

## 2021-05-20 DIAGNOSIS — F315 Bipolar disorder, current episode depressed, severe, with psychotic features: Secondary | ICD-10-CM | POA: Diagnosis not present

## 2021-05-27 DIAGNOSIS — F315 Bipolar disorder, current episode depressed, severe, with psychotic features: Secondary | ICD-10-CM | POA: Diagnosis not present

## 2021-06-03 DIAGNOSIS — F315 Bipolar disorder, current episode depressed, severe, with psychotic features: Secondary | ICD-10-CM | POA: Diagnosis not present

## 2021-06-04 DIAGNOSIS — F315 Bipolar disorder, current episode depressed, severe, with psychotic features: Secondary | ICD-10-CM | POA: Diagnosis not present

## 2021-06-17 DIAGNOSIS — F315 Bipolar disorder, current episode depressed, severe, with psychotic features: Secondary | ICD-10-CM | POA: Diagnosis not present

## 2021-07-30 DIAGNOSIS — M792 Neuralgia and neuritis, unspecified: Secondary | ICD-10-CM | POA: Diagnosis not present

## 2021-07-30 DIAGNOSIS — Z6837 Body mass index (BMI) 37.0-37.9, adult: Secondary | ICD-10-CM | POA: Diagnosis not present

## 2021-07-30 DIAGNOSIS — Z0000 Encounter for general adult medical examination without abnormal findings: Secondary | ICD-10-CM | POA: Diagnosis not present

## 2021-08-12 DIAGNOSIS — F315 Bipolar disorder, current episode depressed, severe, with psychotic features: Secondary | ICD-10-CM | POA: Diagnosis not present

## 2021-08-13 DIAGNOSIS — Z7251 High risk heterosexual behavior: Secondary | ICD-10-CM | POA: Diagnosis not present

## 2021-08-13 DIAGNOSIS — Z1159 Encounter for screening for other viral diseases: Secondary | ICD-10-CM | POA: Diagnosis not present

## 2021-08-13 DIAGNOSIS — Z114 Encounter for screening for human immunodeficiency virus [HIV]: Secondary | ICD-10-CM | POA: Diagnosis not present

## 2021-08-17 DIAGNOSIS — Z30011 Encounter for initial prescription of contraceptive pills: Secondary | ICD-10-CM | POA: Diagnosis not present

## 2021-08-26 DIAGNOSIS — F315 Bipolar disorder, current episode depressed, severe, with psychotic features: Secondary | ICD-10-CM | POA: Diagnosis not present

## 2021-09-02 ENCOUNTER — Ambulatory Visit: Payer: Medicaid Other | Admitting: Women's Health

## 2021-09-15 DIAGNOSIS — F315 Bipolar disorder, current episode depressed, severe, with psychotic features: Secondary | ICD-10-CM | POA: Diagnosis not present

## 2021-10-06 DIAGNOSIS — N730 Acute parametritis and pelvic cellulitis: Secondary | ICD-10-CM | POA: Diagnosis not present

## 2021-10-06 DIAGNOSIS — Z7251 High risk heterosexual behavior: Secondary | ICD-10-CM | POA: Diagnosis not present

## 2021-10-06 DIAGNOSIS — B9689 Other specified bacterial agents as the cause of diseases classified elsewhere: Secondary | ICD-10-CM | POA: Diagnosis not present

## 2021-10-06 DIAGNOSIS — R300 Dysuria: Secondary | ICD-10-CM | POA: Diagnosis not present

## 2021-10-06 DIAGNOSIS — N760 Acute vaginitis: Secondary | ICD-10-CM | POA: Diagnosis not present

## 2021-10-06 DIAGNOSIS — Z3200 Encounter for pregnancy test, result unknown: Secondary | ICD-10-CM | POA: Diagnosis not present

## 2021-10-06 DIAGNOSIS — Z114 Encounter for screening for human immunodeficiency virus [HIV]: Secondary | ICD-10-CM | POA: Diagnosis not present

## 2021-10-13 DIAGNOSIS — F315 Bipolar disorder, current episode depressed, severe, with psychotic features: Secondary | ICD-10-CM | POA: Diagnosis not present

## 2021-11-04 ENCOUNTER — Ambulatory Visit: Payer: Medicaid Other

## 2021-11-17 ENCOUNTER — Ambulatory Visit: Payer: Medicaid Other | Admitting: Obstetrics and Gynecology

## 2021-11-17 ENCOUNTER — Ambulatory Visit: Payer: Medicaid Other | Admitting: Obstetrics

## 2021-12-04 DIAGNOSIS — F315 Bipolar disorder, current episode depressed, severe, with psychotic features: Secondary | ICD-10-CM | POA: Diagnosis not present

## 2021-12-22 ENCOUNTER — Ambulatory Visit: Payer: Medicaid Other | Admitting: Obstetrics

## 2021-12-24 ENCOUNTER — Ambulatory Visit: Payer: Medicaid Other | Admitting: Obstetrics

## 2022-02-11 DIAGNOSIS — R21 Rash and other nonspecific skin eruption: Secondary | ICD-10-CM | POA: Diagnosis not present

## 2022-02-11 DIAGNOSIS — R87619 Unspecified abnormal cytological findings in specimens from cervix uteri: Secondary | ICD-10-CM | POA: Diagnosis not present

## 2022-02-11 DIAGNOSIS — Z3009 Encounter for other general counseling and advice on contraception: Secondary | ICD-10-CM | POA: Diagnosis not present

## 2022-04-13 DIAGNOSIS — B3731 Acute candidiasis of vulva and vagina: Secondary | ICD-10-CM | POA: Diagnosis not present

## 2022-04-13 DIAGNOSIS — Z7251 High risk heterosexual behavior: Secondary | ICD-10-CM | POA: Diagnosis not present

## 2022-04-13 DIAGNOSIS — B9689 Other specified bacterial agents as the cause of diseases classified elsewhere: Secondary | ICD-10-CM | POA: Diagnosis not present

## 2022-04-13 DIAGNOSIS — G8929 Other chronic pain: Secondary | ICD-10-CM | POA: Diagnosis not present

## 2022-04-13 DIAGNOSIS — M545 Low back pain, unspecified: Secondary | ICD-10-CM | POA: Diagnosis not present

## 2022-04-13 DIAGNOSIS — N760 Acute vaginitis: Secondary | ICD-10-CM | POA: Diagnosis not present

## 2022-04-13 DIAGNOSIS — R7303 Prediabetes: Secondary | ICD-10-CM | POA: Diagnosis not present

## 2022-04-13 DIAGNOSIS — Z79899 Other long term (current) drug therapy: Secondary | ICD-10-CM | POA: Diagnosis not present

## 2022-04-13 DIAGNOSIS — R21 Rash and other nonspecific skin eruption: Secondary | ICD-10-CM | POA: Diagnosis not present

## 2022-04-15 ENCOUNTER — Ambulatory Visit: Payer: Medicaid Other | Admitting: Podiatry

## 2022-06-02 ENCOUNTER — Emergency Department (HOSPITAL_COMMUNITY)
Admission: EM | Admit: 2022-06-02 | Discharge: 2022-06-03 | Discharge status: Against Medical Advice | Payer: Medicaid Other | Attending: Emergency Medicine | Admitting: Emergency Medicine

## 2022-06-02 ENCOUNTER — Other Ambulatory Visit: Payer: Self-pay

## 2022-06-02 ENCOUNTER — Encounter (HOSPITAL_COMMUNITY): Payer: Self-pay | Admitting: Emergency Medicine

## 2022-06-02 DIAGNOSIS — R109 Unspecified abdominal pain: Secondary | ICD-10-CM | POA: Diagnosis present

## 2022-06-02 DIAGNOSIS — R197 Diarrhea, unspecified: Secondary | ICD-10-CM | POA: Insufficient documentation

## 2022-06-02 DIAGNOSIS — K5900 Constipation, unspecified: Secondary | ICD-10-CM | POA: Insufficient documentation

## 2022-06-02 DIAGNOSIS — R112 Nausea with vomiting, unspecified: Secondary | ICD-10-CM | POA: Insufficient documentation

## 2022-06-02 DIAGNOSIS — Z7952 Long term (current) use of systemic steroids: Secondary | ICD-10-CM | POA: Diagnosis not present

## 2022-06-02 LAB — COMPREHENSIVE METABOLIC PANEL
ALT: 8 U/L (ref 0–44)
AST: 12 U/L — ABNORMAL LOW (ref 15–41)
Albumin: 3.8 g/dL (ref 3.5–5.0)
Alkaline Phosphatase: 46 U/L (ref 38–126)
Anion gap: 6 (ref 5–15)
BUN: 6 mg/dL (ref 6–20)
CO2: 25 mmol/L (ref 22–32)
Calcium: 9.1 mg/dL (ref 8.9–10.3)
Chloride: 108 mmol/L (ref 98–111)
Creatinine, Ser: 0.67 mg/dL (ref 0.44–1.00)
GFR, Estimated: >60 mL/min (ref >60)
Glucose, Bld: 92 mg/dL (ref 70–99)
Potassium: 3.4 mmol/L — ABNORMAL LOW (ref 3.5–5.1)
Sodium: 139 mmol/L (ref 135–145)
Total Bilirubin: 0.3 mg/dL (ref 0.3–1.2)
Total Protein: 6.7 g/dL (ref 6.5–8.1)

## 2022-06-02 LAB — CBC
HCT: 33.6 % — ABNORMAL LOW (ref 36.0–46.0)
Hemoglobin: 11.3 g/dL — ABNORMAL LOW (ref 12.0–15.0)
MCH: 28.8 pg (ref 26.0–34.0)
MCHC: 33.6 g/dL (ref 30.0–36.0)
MCV: 85.5 fL (ref 80.0–100.0)
Platelets: 305 10*3/uL (ref 150–400)
RBC: 3.93 MIL/uL (ref 3.87–5.11)
RDW: 13.8 % (ref 11.5–15.5)
WBC: 8.1 10*3/uL (ref 4.0–10.5)
nRBC: 0 % (ref 0.0–0.2)

## 2022-06-02 LAB — URINALYSIS, ROUTINE W REFLEX MICROSCOPIC
Bilirubin Urine: NEGATIVE
Glucose, UA: NEGATIVE mg/dL
Hgb urine dipstick: NEGATIVE
Ketones, ur: NEGATIVE mg/dL
Leukocytes,Ua: NEGATIVE
Nitrite: NEGATIVE
Protein, ur: NEGATIVE mg/dL
Specific Gravity, Urine: 1.028 (ref 1.005–1.030)
pH: 5 (ref 5.0–8.0)

## 2022-06-02 LAB — LIPASE, BLOOD: Lipase: 52 U/L — ABNORMAL HIGH (ref 11–51)

## 2022-06-02 NOTE — ED Triage Notes (Signed)
Pt c/o abdominal pain and constipation x 4 weeks, reports nausea/vomiting started last week. States she used a suppository tonight with little relief.

## 2022-06-03 ENCOUNTER — Encounter (HOSPITAL_BASED_OUTPATIENT_CLINIC_OR_DEPARTMENT_OTHER): Payer: Self-pay | Admitting: Emergency Medicine

## 2022-06-03 ENCOUNTER — Emergency Department (HOSPITAL_BASED_OUTPATIENT_CLINIC_OR_DEPARTMENT_OTHER): Payer: Medicaid Other | Admitting: Radiology

## 2022-06-03 ENCOUNTER — Emergency Department (HOSPITAL_BASED_OUTPATIENT_CLINIC_OR_DEPARTMENT_OTHER)
Admission: EM | Admit: 2022-06-03 | Discharge: 2022-06-03 | Disposition: A | Discharge status: Home / Self Care | Payer: Medicaid Other | Source: Home / Self Care | Attending: Emergency Medicine | Admitting: Emergency Medicine

## 2022-06-03 ENCOUNTER — Emergency Department (HOSPITAL_BASED_OUTPATIENT_CLINIC_OR_DEPARTMENT_OTHER): Payer: Medicaid Other

## 2022-06-03 DIAGNOSIS — K5900 Constipation, unspecified: Secondary | ICD-10-CM | POA: Insufficient documentation

## 2022-06-03 DIAGNOSIS — J45909 Unspecified asthma, uncomplicated: Secondary | ICD-10-CM | POA: Insufficient documentation

## 2022-06-03 DIAGNOSIS — R197 Diarrhea, unspecified: Secondary | ICD-10-CM | POA: Insufficient documentation

## 2022-06-03 DIAGNOSIS — Z7952 Long term (current) use of systemic steroids: Secondary | ICD-10-CM | POA: Insufficient documentation

## 2022-06-03 DIAGNOSIS — K6289 Other specified diseases of anus and rectum: Secondary | ICD-10-CM | POA: Diagnosis not present

## 2022-06-03 DIAGNOSIS — K649 Unspecified hemorrhoids: Secondary | ICD-10-CM | POA: Diagnosis not present

## 2022-06-03 DIAGNOSIS — R112 Nausea with vomiting, unspecified: Secondary | ICD-10-CM | POA: Insufficient documentation

## 2022-06-03 LAB — URINALYSIS, ROUTINE W REFLEX MICROSCOPIC
Bilirubin Urine: NEGATIVE
Glucose, UA: NEGATIVE mg/dL
Ketones, ur: NEGATIVE mg/dL
Leukocytes,Ua: NEGATIVE
Nitrite: NEGATIVE
Protein, ur: 30 mg/dL — AB
Specific Gravity, Urine: 1.031 — ABNORMAL HIGH (ref 1.005–1.030)
pH: 6.5 (ref 5.0–8.0)

## 2022-06-03 LAB — PREGNANCY, URINE: Preg Test, Ur: NEGATIVE

## 2022-06-03 MED ORDER — FLEET ENEMA 7-19 GM/118ML RE ENEM
1.0000 | ENEMA | Freq: Once | RECTAL | Status: AC
  Administered 2022-06-03: 1 via RECTAL
  Filled 2022-06-03: qty 1

## 2022-06-03 MED ORDER — LIDOCAINE HCL URETHRAL/MUCOSAL 2 % EX GEL
1.0000 | Freq: Once | CUTANEOUS | Status: AC
  Administered 2022-06-03: 1 via TOPICAL
  Filled 2022-06-03: qty 11

## 2022-06-03 MED ORDER — PODOFILOX 0.5 % EX SOLN: Freq: Two times a day (BID) | CUTANEOUS | 0 refills | Status: DC

## 2022-06-03 NOTE — ED Notes (Signed)
Discharge instructions, follow up care, and prescription reviewed and explained, pt verbalized understanding. Pt refused to stay for ordered x-ray/PA aware. Pt had no further questions on d/c. Pt caox4 and ambulatory on d/c.

## 2022-06-03 NOTE — ED Notes (Signed)
ED Provider at bedside. 

## 2022-06-03 NOTE — ED Provider Notes (Signed)
[facility] EMERGENCY DEPT Provider Note   CSN: [id number] Arrival date & time: 06/03/22  1528     History PMH: STD, Obesity, Asthma Chief Complaint  Patient presents with  . Constipation    Kristin Powers is a 31 y.o. female.  Presents with constipation.  She states she has not had a regular bowel movement for about 1 month now.  She has had intermittent small amounts of loose stools since then.  That a week ago, she developed nausea and vomiting for 3 days.  She feels like she has a lot of pressure in her rectal area and thinks that there is something impacting her stool.  She states that she is taken multiple doses of MiraLAX and Dulcolax without any relief.  She is not passing much gas.  She states that she was seen at her PCP today and they tried to do a rectal exam but were not able due to the pain.  She also states that she has a bulge in her rectal area as well that is new.  She denies any fevers or chills, abdominal pain, dysuria, hematuria.   Constipation Associated symptoms: diarrhea   Associated symptoms: no abdominal pain and no fever        Home Medications Prior to Admission medications   Medication Sig Start Date End Date Taking? Authorizing Provider  podofilox (CONDYLOX) 0.5 % external solution Apply topically 2 (two) times daily. 06/03/22  Yes Deetta Siegmann, Finis Bud, PA-C  cyclobenzaprine (FLEXERIL) 10 MG tablet Take 1 tablet (10 mg total) by mouth 3 (three) times daily as needed for muscle spasms. 05/02/20   Candelaria Stagers, DPM  cyclobenzaprine (FLEXERIL) 10 MG tablet Take 1 tablet (10 mg total) by mouth 3 (three) times daily as needed for muscle spasms. 06/03/20   Candelaria Stagers, DPM  ferrous sulfate (FERROUSUL) 325 (65 FE) MG tablet Take 1 tablet (325 mg total) by mouth 2 (two) times daily. 04/06/16   Katrinka Blazing, [place], CNM  fluconazole (DIFLUCAN) 150 MG tablet Take 1 tablet (150 mg total) by mouth once a week. 02/21/20   Park Liter, DPM   HYDROcodone-acetaminophen (NORCO/VICODIN) 5-325 MG tablet Take 1 tablet by mouth every 4 (four) hours as needed for moderate pain. 01/17/20   Mancel Bale, MD  ibuprofen (ADVIL) 800 MG tablet Take 1 tablet (800 mg total) by mouth every 6 (six) hours as needed. 06/03/20   Candelaria Stagers, DPM  lidocaine (LIDODERM) 5 % Place 1 patch onto the skin daily. Remove & Discard patch within 12 hours or as directed by MD 01/13/20   Henderly, Britni A, PA-C  meloxicam (MOBIC) 15 MG tablet Take 1 tablet (15 mg total) by mouth daily. 05/02/20   Candelaria Stagers, DPM  methocarbamol (ROBAXIN) 500 MG tablet Take 1 tablet (500 mg total) by mouth 2 (two) times daily. 01/13/20   Henderly, Britni A, PA-C  methylPREDNISolone (MEDROL DOSEPAK) 4 MG TBPK tablet Use as directed 05/02/20   Candelaria Stagers, DPM  metroNIDAZOLE (FLAGYL) 500 MG tablet Take 1 tablet (500 mg total) by mouth 2 (two) times daily. 10/02/18   Eustace Moore, MD  predniSONE (DELTASONE) 20 MG tablet Take 1 tablet (20 mg total) by mouth 2 (two) times daily. 01/17/20   Mancel Bale, MD  terconazole (TERAZOL 3) 0.8 % vaginal cream Place 1 applicator vaginally at bedtime. 09/28/18   Mardella Layman, MD  potassium chloride SA (K-DUR,KLOR-CON) 20 MEQ tablet Take 1 tablet (20 mEq total) by mouth 3 (  three) times daily. Patient not taking: Reported on 07/25/2016 04/12/16 01/17/20  Dorathy Kinsman, CNM      Allergies    Patient has no known allergies.    Review of Systems   Review of Systems  Constitutional:  Negative for fever.  Gastrointestinal:  Positive for constipation, diarrhea and rectal pain. Negative for abdominal distention and abdominal pain.  All other systems reviewed and are negative.   Physical Exam Updated Vital Signs BP (!) 152/97   Pulse 60   Temp 98.5 F (36.9 C) (Oral)   Resp 12   SpO2 100%  Physical Exam Vitals and nursing note reviewed.  Constitutional:      General: She is not in acute distress.    Appearance: Normal appearance.  She is well-developed. She is not ill-appearing, toxic-appearing or diaphoretic.  HENT:     Head: Normocephalic and atraumatic.     Nose: No nasal deformity.     Mouth/Throat:     Lips: Pink. No lesions.  Eyes:     General: Gaze aligned appropriately. No scleral icterus.       Right eye: No discharge.        Left eye: No discharge.     Conjunctiva/sclera: Conjunctivae normal.     Right eye: Right conjunctiva is not injected. No exudate or hemorrhage.    Left eye: Left conjunctiva is not injected. No exudate or hemorrhage. Pulmonary:     Effort: Pulmonary effort is normal. No respiratory distress.  Abdominal:     General: Abdomen is flat. Bowel sounds are normal. There is no distension.     Palpations: Abdomen is soft.     Tenderness: There is no abdominal tenderness. There is no right CVA tenderness, left CVA tenderness, guarding or rebound.  Skin:    General: Skin is warm and dry.  Neurological:     Mental Status: She is alert and oriented to person, place, and time.  Psychiatric:        Mood and Affect: Mood normal.        Speech: Speech normal.        Behavior: Behavior normal. Behavior is cooperative.     ED Results / Procedures / Treatments   Labs (all labs ordered are listed, but only abnormal results are displayed) Labs Reviewed  URINALYSIS, ROUTINE W REFLEX MICROSCOPIC - Abnormal; Notable for the following components:      Result Value   Specific Gravity, Urine 1.031 (*)    Hgb urine dipstick LARGE (*)    Protein, ur 30 (*)    All other components within normal limits  PREGNANCY, URINE    EKG None  Radiology No results found.  Procedures Procedures   Medications Ordered in ED Medications  lidocaine (XYLOCAINE) 2 % jelly 1 Application (1 Application Topical Given 06/03/22 1651)  sodium phosphate (FLEET) 7-19 GM/118ML enema 1 enema (1 enema Rectal Given 06/03/22 1718)    ED Course/ Medical Decision Making/ A&P                            Medical  Decision Making Amount and/or Complexity of Data Reviewed Labs: ordered. Radiology: ordered.  Risk OTC drugs. Prescription drug management.    MDM  This is a 31 y.o. female who presents to the ED with constipation and rectal pain The differential of this patient includes but is not limited to stool impaction, hemorrhoid, anal fissure, bowel obstruction  Initial Impression  Well appearing in  no acute distress.  Stable vitals Benign abdominal exam Will perform rectal exam I reviewed labs from yesterday and no notable abnormalities.   I personally ordered, reviewed, and interpreted all laboratory work and imaging and agree with radiologist interpretation. Results interpreted below:  Negative pregnancy test today as well as unremarkable urine.  Assessment/Plan:  Patient has no continued nausea or vomiting. Tolerating PO. Abdominal exam is unremarkable.  Low concern for bowel obstruction at this time.  I have offered her an x-ray of her abdomen, but patient refuses this.  I have done a rectal exam and could not palpate any fecal impaction.  No evidence of thrombosed hemorrhoid, external hemorrhoids, anal fissure, or other abnormality.  We have done a Fleet enema and patient had some small movement following this, however not substantial.  He thinks this may be because she has not really eaten a lot recently.  She is requesting discharge at this time.  I provided her with gastroenterology information given her return precautions.   Charting Requirements Additional history is obtained from:  Independent historian External Records from outside source obtained and reviewed including: Recent Labs from yesterday Social Determinants of Health:  none Pertinant PMH that complicates patient's illness: n/a  Patient Care Problems that were addressed during this visit: - Constipation: Acute illness with systemic symptoms Medications given in ED: Fleet Enema Reevaluation of the patient after  these medicines showed that the patient improved I have reviewed home medications and made changes accordingly.  Critical Care Interventions: n/a Consultations: n/a Disposition: discharge  Portions of this note were generated with Dragon dictation software. Dictation errors may occur despite best attempts at proofreading.    Final Clinical Impression(s) / ED Diagnoses Final diagnoses:  Constipation, unspecified constipation type    Rx / DC Orders ED Discharge Orders          Ordered    podofilox (CONDYLOX) 0.5 % external solution  2 times daily        06/03/22 1701              Tatem Holsonback, Finis Bud, PA-C 06/03/22 1811    Alvira Monday, MD 06/05/22 [phone]

## 2022-06-03 NOTE — ED Triage Notes (Signed)
Pt arrives to ED with c/o constipation x2 weeks. Has tried laxatives w/o relief.

## 2022-06-03 NOTE — Discharge Instructions (Signed)
Please continue to eat foods high in fiber. Continue daily Miralax. Schedule appointment with gastroenterologist.  Please return if worsening or concerning symptoms.

## 2022-06-03 NOTE — ED Notes (Signed)
Pt left AMA °

## 2022-12-29 DIAGNOSIS — Z7251 High risk heterosexual behavior: Secondary | ICD-10-CM | POA: Diagnosis not present

## 2022-12-29 DIAGNOSIS — B3731 Acute candidiasis of vulva and vagina: Secondary | ICD-10-CM | POA: Diagnosis not present

## 2023-02-28 DIAGNOSIS — Z7251 High risk heterosexual behavior: Secondary | ICD-10-CM | POA: Diagnosis not present

## 2023-02-28 DIAGNOSIS — N9089 Other specified noninflammatory disorders of vulva and perineum: Secondary | ICD-10-CM | POA: Diagnosis not present

## 2023-02-28 DIAGNOSIS — N898 Other specified noninflammatory disorders of vagina: Secondary | ICD-10-CM | POA: Diagnosis not present

## 2023-02-28 DIAGNOSIS — Z6834 Body mass index (BMI) 34.0-34.9, adult: Secondary | ICD-10-CM | POA: Diagnosis not present

## 2023-02-28 DIAGNOSIS — N760 Acute vaginitis: Secondary | ICD-10-CM | POA: Diagnosis not present

## 2023-02-28 DIAGNOSIS — B9689 Other specified bacterial agents as the cause of diseases classified elsewhere: Secondary | ICD-10-CM | POA: Diagnosis not present

## 2023-02-28 DIAGNOSIS — Z3200 Encounter for pregnancy test, result unknown: Secondary | ICD-10-CM | POA: Diagnosis not present

## 2023-03-31 ENCOUNTER — Ambulatory Visit: Payer: Medicaid Other | Admitting: Podiatry

## 2023-04-14 ENCOUNTER — Ambulatory Visit (INDEPENDENT_AMBULATORY_CARE_PROVIDER_SITE_OTHER): Payer: Medicaid Other

## 2023-04-14 ENCOUNTER — Encounter: Payer: Self-pay | Admitting: Podiatry

## 2023-04-14 ENCOUNTER — Ambulatory Visit: Payer: Medicaid Other | Admitting: Podiatry

## 2023-04-14 DIAGNOSIS — M779 Enthesopathy, unspecified: Secondary | ICD-10-CM | POA: Diagnosis not present

## 2023-04-14 DIAGNOSIS — M79671 Pain in right foot: Secondary | ICD-10-CM | POA: Diagnosis not present

## 2023-04-14 DIAGNOSIS — M7751 Other enthesopathy of right foot: Secondary | ICD-10-CM | POA: Diagnosis not present

## 2023-04-14 MED ORDER — DICLOFENAC SODIUM 75 MG PO TBEC: 75.0000 mg | DELAYED_RELEASE_TABLET | Freq: Two times a day (BID) | ORAL | 2 refills | Status: DC

## 2023-04-14 MED ORDER — TRIAMCINOLONE ACETONIDE 10 MG/ML IJ SUSP
10.0000 mg | Freq: Once | INTRAMUSCULAR | Status: AC
  Administered 2023-04-14: 10 mg

## 2023-04-14 NOTE — Progress Notes (Signed)
Subjective:   Patient ID: Kristin Powers, female   DOB: 32 y.o.   MRN: [id number]   HPI Patient presents stating she has had a lot of pain in the outside of her right foot does not remember specific injury   ROS      Objective:  Physical Exam  Neurovascular status intact inflammation pain of the lateral right foot no indication of tendon dysfunction but appears to be the peroneal tendon as it comes under the lateral malleolus     Assessment:  Peroneal tendinitis right with inflammation     Plan:  Sterile prep discussed injection explained risk she wants injection and I then did a sheath injection 3 mg Dexasone Kenalog 5 mg Xylocaine advised on reduced activity ice therapy support shoes reappoint as symptoms indicate

## 2023-04-18 ENCOUNTER — Other Ambulatory Visit: Payer: Self-pay | Admitting: Podiatry

## 2023-04-18 DIAGNOSIS — M79671 Pain in right foot: Secondary | ICD-10-CM

## 2023-04-18 DIAGNOSIS — M779 Enthesopathy, unspecified: Secondary | ICD-10-CM

## 2023-05-05 ENCOUNTER — Ambulatory Visit: Payer: Medicaid Other | Admitting: Podiatry

## 2023-05-11 DIAGNOSIS — B3731 Acute candidiasis of vulva and vagina: Secondary | ICD-10-CM | POA: Diagnosis not present

## 2023-05-11 DIAGNOSIS — Z7689 Persons encountering health services in other specified circumstances: Secondary | ICD-10-CM | POA: Diagnosis not present

## 2023-06-10 DIAGNOSIS — B9689 Other specified bacterial agents as the cause of diseases classified elsewhere: Secondary | ICD-10-CM | POA: Diagnosis not present

## 2023-06-10 DIAGNOSIS — N760 Acute vaginitis: Secondary | ICD-10-CM | POA: Diagnosis not present

## 2023-06-10 DIAGNOSIS — N898 Other specified noninflammatory disorders of vagina: Secondary | ICD-10-CM | POA: Diagnosis not present

## 2023-06-10 DIAGNOSIS — Z7251 High risk heterosexual behavior: Secondary | ICD-10-CM | POA: Diagnosis not present

## 2023-06-10 DIAGNOSIS — R300 Dysuria: Secondary | ICD-10-CM | POA: Diagnosis not present

## 2023-06-13 ENCOUNTER — Other Ambulatory Visit: Payer: Self-pay

## 2023-06-13 ENCOUNTER — Emergency Department (HOSPITAL_COMMUNITY)
Admission: EM | Admit: 2023-06-13 | Discharge: 2023-06-13 | Discharge status: Against Medical Advice | Payer: Medicaid Other | Attending: Emergency Medicine | Admitting: Emergency Medicine

## 2023-06-13 ENCOUNTER — Emergency Department (HOSPITAL_BASED_OUTPATIENT_CLINIC_OR_DEPARTMENT_OTHER)
Admission: EM | Admit: 2023-06-13 | Discharge: 2023-06-13 | Disposition: A | Discharge status: Home / Self Care | Payer: Medicaid Other | Source: Home / Self Care | Attending: Emergency Medicine | Admitting: Emergency Medicine

## 2023-06-13 ENCOUNTER — Encounter (HOSPITAL_BASED_OUTPATIENT_CLINIC_OR_DEPARTMENT_OTHER): Payer: Self-pay

## 2023-06-13 ENCOUNTER — Encounter (HOSPITAL_COMMUNITY): Payer: Self-pay | Admitting: *Deleted

## 2023-06-13 DIAGNOSIS — J45909 Unspecified asthma, uncomplicated: Secondary | ICD-10-CM | POA: Insufficient documentation

## 2023-06-13 DIAGNOSIS — H5711 Ocular pain, right eye: Secondary | ICD-10-CM | POA: Insufficient documentation

## 2023-06-13 DIAGNOSIS — H209 Unspecified iridocyclitis: Secondary | ICD-10-CM | POA: Insufficient documentation

## 2023-06-13 DIAGNOSIS — Z5321 Procedure and treatment not carried out due to patient leaving prior to being seen by health care provider: Secondary | ICD-10-CM | POA: Insufficient documentation

## 2023-06-13 MED ORDER — PREDNISOLONE ACETATE 1 % OP SUSP
1.0000 [drp] | Freq: Once | OPHTHALMIC | Status: AC
  Administered 2023-06-13: 1 [drp] via OPHTHALMIC
  Filled 2023-06-13: qty 5

## 2023-06-13 MED ORDER — CYCLOPENTOLATE HCL 1 % OP SOLN
1.0000 [drp] | Freq: Once | OPHTHALMIC | Status: AC
  Administered 2023-06-13: 1 [drp] via OPHTHALMIC
  Filled 2023-06-13: qty 2

## 2023-06-13 MED ORDER — KETOROLAC TROMETHAMINE 30 MG/ML IJ SOLN
30.0000 mg | Freq: Once | INTRAMUSCULAR | Status: AC
  Administered 2023-06-13: 30 mg via INTRAMUSCULAR
  Filled 2023-06-13: qty 1

## 2023-06-13 MED ORDER — FLUORESCEIN SODIUM 1 MG OP STRP
1.0000 | ORAL_STRIP | Freq: Once | OPHTHALMIC | Status: AC
  Administered 2023-06-13: 1 via OPHTHALMIC
  Filled 2023-06-13: qty 1

## 2023-06-13 MED ORDER — CYCLOPENTOLATE HCL 1 % OP SOLN: 1.0000 [drp] | Freq: Two times a day (BID) | OPHTHALMIC | 0 refills | Status: DC

## 2023-06-13 MED ORDER — PREDNISOLONE ACETATE 1 % OP SUSP: 1.0000 [drp] | OPHTHALMIC | 0 refills | Status: DC

## 2023-06-13 MED ORDER — TETRACAINE HCL 0.5 % OP SOLN
1.0000 [drp] | Freq: Once | OPHTHALMIC | Status: AC
  Administered 2023-06-13: 1 [drp] via OPHTHALMIC
  Filled 2023-06-13: qty 4

## 2023-06-13 NOTE — Discharge Instructions (Signed)
You were seen today with pain in your right eye.  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  Pain worsens with changes in the pupil and response to light.  Use eyedrops as recommended.  You need a dilated eye exam by an ophthalmologist as soon as possible.  Follow-up provided.

## 2023-06-13 NOTE — ED Triage Notes (Signed)
Pt states that she thinks she scratched her right eye with her contact yesterday. Pt's right conjunctiva  is red and painful to bright lights. Pt states vision is blurred in that eye.

## 2023-06-13 NOTE — ED Triage Notes (Signed)
The pt has rt eye pain around 0000  she scraped her  lens down  across the eye attempting to  remove a contact lens  painful since then very sensitive to light  lmp aug 21

## 2023-06-13 NOTE — ED Notes (Signed)
Pt left AMA °

## 2023-06-13 NOTE — ED Provider Notes (Signed)
[place] EMERGENCY DEPARTMENT AT [facility] Provider Note   CSN: [id number] Arrival date & time: 06/13/23  0214     History  Chief Complaint  Patient presents with   Eye Pain    Kristin Powers is a 32 y.o. female.  HPI     This is a 32 year old female who presents with right eye pain.  Patient reports that she excellently hit her right eye when taking out her contact yesterday.  Since that time she has had worsening pain and redness of the eye.  She reports photophobia.  She states that she is even having difficulty opening her left eye because the light bothers her right eye.  She reports some blurry vision.  She currently does not have a contact in the right eye but is wearing contact in the left eye.  Home Medications Prior to Admission medications   Medication Sig Start Date End Date Taking? Authorizing Provider  cyclopentolate (CYCLODRYL,CYCLOGYL) 1 % ophthalmic solution Place 1 drop into the right eye 2 (two) times daily. 06/13/23  Yes Jerie Basford, Mayer Masker, MD  prednisoLONE acetate (PRED FORTE) 1 % ophthalmic suspension Place 1 drop into the right eye every 2 (two) hours while awake. 06/13/23  Yes Jasmynn Pfalzgraf, Mayer Masker, MD  cyclobenzaprine (FLEXERIL) 10 MG tablet Take 1 tablet (10 mg total) by mouth 3 (three) times daily as needed for muscle spasms. 05/02/20   Candelaria Stagers, DPM  cyclobenzaprine (FLEXERIL) 10 MG tablet Take 1 tablet (10 mg total) by mouth 3 (three) times daily as needed for muscle spasms. 06/03/20   Candelaria Stagers, DPM  diclofenac (VOLTAREN) 75 MG EC tablet Take 1 tablet (75 mg total) by mouth 2 (two) times daily. 04/14/23   Lenn Sink, DPM  ferrous sulfate (FERROUSUL) 325 (65 FE) MG tablet Take 1 tablet (325 mg total) by mouth 2 (two) times daily. 04/06/16   Katrinka Blazing, [place], CNM  fluconazole (DIFLUCAN) 150 MG tablet Take 1 tablet (150 mg total) by mouth once a week. 02/21/20   Park Liter, DPM  HYDROcodone-acetaminophen (NORCO/VICODIN) 5-325 MG tablet  Take 1 tablet by mouth every 4 (four) hours as needed for moderate pain. 01/17/20   Mancel Bale, MD  ibuprofen (ADVIL) 800 MG tablet Take 1 tablet (800 mg total) by mouth every 6 (six) hours as needed. 06/03/20   Candelaria Stagers, DPM  lidocaine (LIDODERM) 5 % Place 1 patch onto the skin daily. Remove & Discard patch within 12 hours or as directed by MD 01/13/20   Henderly, Britni A, PA-C  meloxicam (MOBIC) 15 MG tablet Take 1 tablet (15 mg total) by mouth daily. 05/02/20   Candelaria Stagers, DPM  methocarbamol (ROBAXIN) 500 MG tablet Take 1 tablet (500 mg total) by mouth 2 (two) times daily. 01/13/20   Henderly, Britni A, PA-C  methylPREDNISolone (MEDROL DOSEPAK) 4 MG TBPK tablet Use as directed 05/02/20   Candelaria Stagers, DPM  metroNIDAZOLE (FLAGYL) 500 MG tablet Take 1 tablet (500 mg total) by mouth 2 (two) times daily. 10/02/18   Eustace Moore, MD  podofilox (CONDYLOX) 0.5 % external solution Apply topically 2 (two) times daily. 06/03/22   Loeffler, Finis Bud, PA-C  predniSONE (DELTASONE) 20 MG tablet Take 1 tablet (20 mg total) by mouth 2 (two) times daily. 01/17/20   Mancel Bale, MD  terconazole (TERAZOL 3) 0.8 % vaginal cream Place 1 applicator vaginally at bedtime. 09/28/18   Mardella Layman, MD  potassium chloride SA (K-DUR,KLOR-CON) 20 MEQ tablet  Take 1 tablet (20 mEq total) by mouth 3 (three) times daily. Patient not taking: Reported on 07/25/2016 04/12/16 01/17/20  Dorathy Kinsman, CNM      Allergies    Patient has no known allergies.    Review of Systems   Review of Systems  Constitutional:  Negative for fever.  Eyes:  Positive for photophobia, pain, redness and visual disturbance. Negative for discharge and itching.  All other systems reviewed and are negative.   Physical Exam Updated Vital Signs BP (!) 152/101   Pulse 71   Temp 97.7 F (36.5 C) (Oral)   Resp 20   LMP 05/25/2023 (Exact Date)   SpO2 100%  Physical Exam Vitals and nursing note reviewed.  Constitutional:       Appearance: She is well-developed. She is not ill-appearing.  HENT:     Head: Normocephalic and atraumatic.     Nose: Nose normal.  Eyes:     General: Lids are normal. Vision grossly intact. Gaze aligned appropriately. No allergic shiner, visual field deficit or scleral icterus.       Right eye: No foreign body, discharge or hordeolum.     Extraocular Movements: Extraocular movements intact.     Conjunctiva/sclera:     Right eye: Right conjunctiva is injected. No hemorrhage.    Pupils: Pupils are equal, round, and reactive to light.     Comments: Fluorescein exam without significant uptake at the cornea, negative Seidel's,, no obvious infiltrates or epithelial defects, no obvious corneal ulcer, pupils directly and consensually appropriately reactive 20/30 left eye, corrected 20/200 right eye, uncorrected 20/30 both eyes  Cardiovascular:     Rate and Rhythm: Normal rate and regular rhythm.     Heart sounds: Normal heart sounds.  Pulmonary:     Effort: Pulmonary effort is normal. No respiratory distress.     Breath sounds: No wheezing.  Abdominal:     General: Bowel sounds are normal.     Palpations: Abdomen is soft.  Musculoskeletal:     Cervical back: Neck supple.  Skin:    General: Skin is warm and dry.  Neurological:     Mental Status: She is alert and oriented to person, place, and time.     ED Results / Procedures / Treatments   Labs (all labs ordered are listed, but only abnormal results are displayed) Labs Reviewed - No data to display  EKG None  Radiology No results found.  Procedures Procedures    Medications Ordered in ED Medications  tetracaine (PONTOCAINE) 0.5 % ophthalmic solution 1 drop (1 drop Right Eye Given 06/13/23 0253)  fluorescein ophthalmic strip 1 strip (1 strip Right Eye Given 06/13/23 0253)  ketorolac (TORADOL) 30 MG/ML injection 30 mg (30 mg Intramuscular Given 06/13/23 0321)  cyclopentolate (CYCLODRYL,CYCLOGYL) 1 % ophthalmic solution 1 drop (1  drop Right Eye Given 06/13/23 0322)  prednisoLONE acetate (PRED FORTE) 1 % ophthalmic suspension 1 drop (1 drop Right Eye Given 06/13/23 0320)    ED Course/ Medical Decision Making/ A&P                                 Medical Decision Making Risk Prescription drug management.   This patient presents to the ED for concern of right eye pain, this involves an extensive number of treatment options, and is a complaint that carries with it a high risk of complications and morbidity.  I considered the following differential and admission for  this acute, potentially life threatening condition.  The differential diagnosis includes corneal abrasion, corneal ulcer, traumatic iritis, open globe  MDM:    This is a 32 year old female who presents after an injury to the right eye.  Feels she scratched her eye when she was removing her contact yesterday.  No evidence of corneal abrasion on exam.  Most of her complaints are regarding photophobia even in a concentric manner.  Feel this is highly suggestive of traumatic iritis.  She does not have significant improvement with topical analgesics.  Low suspicion for Keratouveitis.  Will start on topical cyclopentolate and Pred forte.  Patient states that she does not have an ophthalmologist.  Will refer her to outpatient ophthalmology for formal exam.  She should not wear contact in that right eye until cleared by ophthalmology.  (Labs, imaging, consults)  Labs: I Ordered, and personally interpreted labs.  The pertinent results include: None  Imaging Studies ordered: I ordered imaging studies including none none I independently visualized and interpreted imaging. I agree with the radiologist interpretation  Additional history obtained from chart review.  External records from outside source obtained and reviewed including prior evaluations  Cardiac Monitoring: The patient was not maintained on a cardiac monitor.  If on the cardiac monitor, I personally viewed  and interpreted the cardiac monitored which showed an underlying rhythm of: N/A  Reevaluation: After the interventions noted above, I reevaluated the patient and found that they have :stayed the same  Social Determinants of Health:  lives independently  Disposition: Discharge  Co morbidities that complicate the patient evaluation  Past Medical History:  Diagnosis Date   Abnormal Pap smear    Acid reflux    Asthma    Depression    Headache(784.0)    Human papilloma virus    Pregnancy induced hypertension    Trichomonas    Urinary tract infection    Vaginal Pap smear, abnormal      Medicines Meds ordered this encounter  Medications   tetracaine (PONTOCAINE) 0.5 % ophthalmic solution 1 drop   fluorescein ophthalmic strip 1 strip   ketorolac (TORADOL) 30 MG/ML injection 30 mg   cyclopentolate (CYCLODRYL,CYCLOGYL) 1 % ophthalmic solution 1 drop   prednisoLONE acetate (PRED FORTE) 1 % ophthalmic suspension 1 drop   prednisoLONE acetate (PRED FORTE) 1 % ophthalmic suspension    Sig: Place 1 drop into the right eye every 2 (two) hours while awake.    Dispense:  5 mL    Refill:  0   cyclopentolate (CYCLODRYL,CYCLOGYL) 1 % ophthalmic solution    Sig: Place 1 drop into the right eye 2 (two) times daily.    Dispense:  2 mL    Refill:  0    I have reviewed the patients home medicines and have made adjustments as needed  Problem List / ED Course: Problem List Items Addressed This Visit   None Visit Diagnoses     Traumatic iritis    -  Primary                   Final Clinical Impression(s) / ED Diagnoses Final diagnoses:  Traumatic iritis    Rx / DC Orders ED Discharge Orders          Ordered    prednisoLONE acetate (PRED FORTE) 1 % ophthalmic suspension  Every 2 hours while awake        06/13/23 0326    cyclopentolate (CYCLODRYL,CYCLOGYL) 1 % ophthalmic solution  2 times daily  06/13/23 1610              Shon Baton, MD 06/13/23  [phone]

## 2023-06-13 NOTE — ED Notes (Signed)
Pt educated on eye drop admin and dose schedule. Pt instructed to NOT wear contact and to follow up with Optometrist ASAP. PT verbalized complete understanding and denies questions at this time.

## 2023-06-21 DIAGNOSIS — Z0000 Encounter for general adult medical examination without abnormal findings: Secondary | ICD-10-CM | POA: Diagnosis not present

## 2023-06-21 DIAGNOSIS — Z6833 Body mass index (BMI) 33.0-33.9, adult: Secondary | ICD-10-CM | POA: Diagnosis not present

## 2023-06-21 DIAGNOSIS — R5383 Other fatigue: Secondary | ICD-10-CM | POA: Diagnosis not present

## 2023-06-21 DIAGNOSIS — Z3200 Encounter for pregnancy test, result unknown: Secondary | ICD-10-CM | POA: Diagnosis not present

## 2023-06-21 DIAGNOSIS — E6609 Other obesity due to excess calories: Secondary | ICD-10-CM | POA: Diagnosis not present

## 2023-06-21 DIAGNOSIS — E559 Vitamin D deficiency, unspecified: Secondary | ICD-10-CM | POA: Diagnosis not present

## 2023-06-24 DIAGNOSIS — M545 Low back pain, unspecified: Secondary | ICD-10-CM | POA: Diagnosis not present

## 2023-07-15 DIAGNOSIS — Z1231 Encounter for screening mammogram for malignant neoplasm of breast: Secondary | ICD-10-CM | POA: Diagnosis not present

## 2023-08-10 DIAGNOSIS — J029 Acute pharyngitis, unspecified: Secondary | ICD-10-CM | POA: Diagnosis not present

## 2023-10-17 DIAGNOSIS — N7689 Other specified inflammation of vagina and vulva: Secondary | ICD-10-CM | POA: Diagnosis not present

## 2023-10-17 DIAGNOSIS — Z3202 Encounter for pregnancy test, result negative: Secondary | ICD-10-CM | POA: Diagnosis not present

## 2023-10-17 DIAGNOSIS — Z202 Contact with and (suspected) exposure to infections with a predominantly sexual mode of transmission: Secondary | ICD-10-CM | POA: Diagnosis not present

## 2024-02-22 ENCOUNTER — Ambulatory Visit (HOSPITAL_COMMUNITY): Admission: EM | Admit: 2024-02-22 | Discharge: 2024-02-22 | Discharge status: Against Medical Advice

## 2024-02-22 NOTE — ED Notes (Signed)
 Pt called to come in from waiting in car. Pt states she left and will return tomorrow.

## 2024-05-10 DIAGNOSIS — E6609 Other obesity due to excess calories: Secondary | ICD-10-CM | POA: Diagnosis not present

## 2024-05-10 DIAGNOSIS — G8929 Other chronic pain: Secondary | ICD-10-CM | POA: Diagnosis not present

## 2024-05-10 DIAGNOSIS — M545 Low back pain, unspecified: Secondary | ICD-10-CM | POA: Diagnosis not present

## 2024-05-10 DIAGNOSIS — N760 Acute vaginitis: Secondary | ICD-10-CM | POA: Diagnosis not present

## 2024-05-10 DIAGNOSIS — B9689 Other specified bacterial agents as the cause of diseases classified elsewhere: Secondary | ICD-10-CM | POA: Diagnosis not present

## 2024-05-10 DIAGNOSIS — Z1159 Encounter for screening for other viral diseases: Secondary | ICD-10-CM | POA: Diagnosis not present

## 2024-05-10 DIAGNOSIS — Z0000 Encounter for general adult medical examination without abnormal findings: Secondary | ICD-10-CM | POA: Diagnosis not present

## 2024-05-28 ENCOUNTER — Ambulatory Visit: Admitting: Physician Assistant

## 2024-06-14 ENCOUNTER — Ambulatory Visit: Admitting: Obstetrics and Gynecology

## 2024-06-14 ENCOUNTER — Other Ambulatory Visit (HOSPITAL_COMMUNITY)
Admission: RE | Admit: 2024-06-14 | Discharge: 2024-06-14 | Disposition: A | Discharge status: Home / Self Care | Source: Ambulatory Visit | Attending: Obstetrics and Gynecology | Admitting: Obstetrics and Gynecology

## 2024-06-14 ENCOUNTER — Encounter: Payer: Self-pay | Admitting: Obstetrics and Gynecology

## 2024-06-14 VITALS — BP 126/86 | HR 76 | Ht 62.5 in | Wt 173.0 lb

## 2024-06-14 DIAGNOSIS — Z01419 Encounter for gynecological examination (general) (routine) without abnormal findings: Secondary | ICD-10-CM

## 2024-06-14 DIAGNOSIS — Z124 Encounter for screening for malignant neoplasm of cervix: Secondary | ICD-10-CM | POA: Insufficient documentation

## 2024-06-14 DIAGNOSIS — F419 Anxiety disorder, unspecified: Secondary | ICD-10-CM

## 2024-06-14 DIAGNOSIS — F32A Depression, unspecified: Secondary | ICD-10-CM | POA: Diagnosis not present

## 2024-06-14 DIAGNOSIS — Z113 Encounter for screening for infections with a predominantly sexual mode of transmission: Secondary | ICD-10-CM | POA: Insufficient documentation

## 2024-06-14 NOTE — Progress Notes (Signed)
 33 y.o. New GYN presents for AEX/PAP/STD screening. C/o low libido, recurrent BV, vaginal discharge, vaginal dryness.   PHQ-9=7  //  GAD-7=12  Referral done.

## 2024-06-14 NOTE — Progress Notes (Signed)
 ANNUAL GYNECOLOGY VISIT Chief Complaint  Patient presents with   NEW PATIENT/GYN     Subjective:  Kristin Powers is a 33 y.o. [phone] who presents for annual exam.  Notes hx recurrent vaginitis. Denies symptoms currently. Mostly BV usually. Also notes previously in therapy for anxiety/depression. Interested in [facility] referral today. Notes issues with vaginal dryness during sex  Gyn History: Patient's last menstrual period was 06/01/2024 (exact date). Sexually active: yes/no: Yes Contraception: spermicide History of STIs: Yes:   Last pap: No results found for: DIAGPAP, HPV, HPVHIGH History of abnormal pap: Yes: hx cryo ~2022 Periods: regular     Upstream - 06/14/24 1548       Pregnancy Intention Screening   Does the patient want to become pregnant in the next year? No    Does the patient's partner want to become pregnant in the next year? No    Would the patient like to discuss contraceptive options today? No         The pregnancy intention screening data noted above was reviewed. Potential methods of contraception were discussed. The patient elected to proceed with No data recorded.       06/14/2024    3:49 PM  Depression screen PHQ 2/9  Decreased Interest 1  Down, Depressed, Hopeless 2  PHQ - 2 Score 3  Altered sleeping 0  Tired, decreased energy 0  Change in appetite 0  Feeling bad or failure about yourself  1  Trouble concentrating 1  Moving slowly or fidgety/restless 2  Suicidal thoughts 0  PHQ-9 Score 7  Difficult doing work/chores Not difficult at all        06/14/2024    3:49 PM  GAD 7 : Generalized Anxiety Score  Nervous, Anxious, on Edge 1  Control/stop worrying 2  Worry too much - different things 3  Trouble relaxing 2  Restless 0  Easily annoyed or irritable 3  Afraid - awful might happen 1  Total GAD 7 Score 12  Anxiety Difficulty Not difficult at all      OB History     Gravida  4   Para  3   Term  3   Preterm  0    AB  1   Living  3      SAB  0   IAB  1   Ectopic  0   Multiple  0   Live Births  3           Past Medical History:  Diagnosis Date   Abnormal Pap smear    Acid reflux    Anemia    Anxiety    Asthma    Depression    Gestational diabetes    Headache(784.0)    Human papilloma virus    Pregnancy induced hypertension    Trichomonas    Urinary tract infection    Vaginal Pap smear, abnormal     Past Surgical History:  Procedure Laterality Date   cryro surgery     INDUCED ABORTION  12/03/2011    Social History   Socioeconomic History   Marital status: Single    Spouse name: Not on file   Number of children: 3   Years of education: Not on file   Highest education level: Not on file  Occupational History   Not on file  Tobacco Use   Smoking status: Former    Current packs/day: 0.50    Average packs/day: 0.5 packs/day for 2.0 years (1.0 ttl pk-yrs)  Types: Cigarettes   Smokeless tobacco: Current  Vaping Use   Vaping status: Every Day  Substance and Sexual Activity   Alcohol use: No   Drug use: No    Types: Hydrocodone    Sexual activity: Yes    Birth control/protection: None, Spermicide  Other Topics Concern   Not on file  Social History Narrative   Not on file   Social Drivers of Health   Financial Resource Strain: Not on file  Food Insecurity: Not on file  Transportation Needs: Not on file  Physical Activity: Not on file  Stress: Not on file  Social Connections: Not on file    Family History  Problem Relation Age of Onset   Depression Mother    Hypertension Mother    Diabetes Mother    Hypertension Father    Cancer Maternal Grandmother    Anesthesia problems Neg Hx    Hypotension Neg Hx    Malignant hyperthermia Neg Hx    Pseudochol deficiency Neg Hx    Other Neg Hx     Current Outpatient Medications on File Prior to Visit  Medication Sig Dispense Refill   cyclobenzaprine  (FLEXERIL ) 10 MG tablet Take 1 tablet (10 mg total) by  mouth 3 (three) times daily as needed for muscle spasms. 30 tablet 0   gabapentin (NEURONTIN) 300 MG capsule Take 300 mg by mouth 2 (two) times daily.     ibuprofen  (ADVIL ) 800 MG tablet Take 1 tablet (800 mg total) by mouth every 6 (six) hours as needed. 60 tablet 1   metroNIDAZOLE  (FLAGYL ) 500 MG tablet Take 1 tablet (500 mg total) by mouth 2 (two) times daily. 14 tablet 0   triamcinolone  cream (KENALOG ) 0.1 % Apply topically 2 (two) times daily.     cyclobenzaprine  (FLEXERIL ) 10 MG tablet Take 1 tablet (10 mg total) by mouth 3 (three) times daily as needed for muscle spasms. 30 tablet 0   ferrous sulfate  (FERROUSUL) 325 (65 FE) MG tablet Take 1 tablet (325 mg total) by mouth 2 (two) times daily. (Patient not taking: Reported on 06/14/2024) 60 tablet 1   prednisoLONE  acetate (PRED FORTE ) 1 % ophthalmic suspension Place 1 drop into the right eye every 2 (two) hours while awake. 5 mL 0   [DISCONTINUED] potassium chloride  SA (K-DUR,KLOR-CON ) 20 MEQ tablet Take 1 tablet (20 mEq total) by mouth 3 (three) times daily. (Patient not taking: Reported on 07/25/2016) 9 tablet 0   No current facility-administered medications on file prior to visit.    No Known Allergies   Objective:   Vitals:   06/14/24 1545  BP: 126/86  Pulse: 76  Weight: 173 lb (78.5 kg)  Height: 5' 2.5 (1.588 m)   Physical Examination:   General appearance - well appearing, and in no distress  Mental status - alert, oriented to person, place, and time  Psych:  normal mood and affect  Skin - warm and dry, normal color, no suspicious lesions noted  Breasts - breasts appear normal, no suspicious masses, no skin or nipple changes or  axillary nodes  Abdomen - soft, nontender, nondistended, no masses or organomegaly  Pelvic -  VULVA: normal appearing vulva with no masses, tenderness or lesions   VAGINA: normal appearing vagina with normal color and discharge, no lesions   CERVIX: normal appearing cervix without discharge or  lesions, no CMT  Thin prep pap is done with HR HPV cotesting  UTERUS: uterus is felt to be normal size, shape, consistency and nontender  ADNEXA: No adnexal masses or tenderness noted.  Extremities:  No swelling or varicosities noted  Chaperone present for exam  Assessment and Plan:  1. Well woman exam with routine gynecological exam (Primary) Pap/HPV Normal clinical breast exam, reviewed self breast exams, mammograms to start at age 52 STI screening Discussed lubricants during sexual intercourse  2. Cervical cancer screening - Cytology - PAP( [facility])  3. Routine screening for STI (sexually transmitted infection) - HIV antibody (with reflex) - Hepatitis C Antibody - Hepatitis B Surface AntiGEN - RPR  4. Anxiety and depression Encouraged [facility] referral and f/u with PCP to discuss medical interventions - Ambulatory referral to Integrated Behavioral Health   Rollo ONEIDA Bring, MD, FACOG Obstetrician & Gynecologist, [facility] for [facility], [facility]

## 2024-06-15 LAB — CERVICOVAGINAL ANCILLARY ONLY
Chlamydia: NEGATIVE
Comment: NEGATIVE
Comment: NEGATIVE
Comment: NORMAL
Neisseria Gonorrhea: NEGATIVE
Trichomonas: NEGATIVE

## 2024-06-15 LAB — HIV ANTIBODY (ROUTINE TESTING W REFLEX): HIV Screen 4th Generation wRfx: NONREACTIVE

## 2024-06-15 LAB — RPR: RPR Ser Ql: NONREACTIVE

## 2024-06-15 LAB — HEPATITIS C ANTIBODY: Hep C Virus Ab: NONREACTIVE

## 2024-06-15 LAB — HEPATITIS B SURFACE ANTIGEN: Hepatitis B Surface Ag: NEGATIVE

## 2024-06-18 ENCOUNTER — Ambulatory Visit: Payer: Self-pay | Admitting: Obstetrics and Gynecology

## 2024-06-19 LAB — CYTOLOGY - PAP
Comment: NEGATIVE
Diagnosis: NEGATIVE
High risk HPV: NEGATIVE

## 2024-07-23 ENCOUNTER — Ambulatory Visit (INDEPENDENT_AMBULATORY_CARE_PROVIDER_SITE_OTHER): Payer: Self-pay | Admitting: Licensed Clinical Social Worker

## 2024-07-23 DIAGNOSIS — F419 Anxiety disorder, unspecified: Secondary | ICD-10-CM

## 2024-07-23 DIAGNOSIS — F32A Depression, unspecified: Secondary | ICD-10-CM | POA: Diagnosis not present

## 2024-07-23 NOTE — BH Specialist Note (Unsigned)
 Integrated Behavioral Health via Telemedicine Visit  07/26/2024 Kristin Powers [id number]  Number of Integrated Behavioral Health Clinician visits: 1- Initial Visit  Session Start time: 1545   Session End time: 1645  Total time in minutes: 60    Referring Provider: Abigail, MD Patient/Family location: At home [facility] Provider location: Remote Office All persons participating in visit: Patient and Kristin Powers Types of Service: Individual psychotherapy and Video visit  I connected with Kristin Powers and/or Kristin Powers's patient via  Telephone or Engineer, civil (consulting)  (Video is Surveyor, mining) and verified that I am speaking with the correct person using two identifiers. Discussed confidentiality: Yes   I discussed the limitations of telemedicine and the availability of in person appointments.  Discussed there is a possibility of technology failure and discussed alternative modes of communication if that failure occurs.  I discussed that engaging in this telemedicine visit, they consent to the provision of behavioral healthcare and the services will be billed under their insurance.  Patient and/or legal guardian expressed understanding and consented to Telemedicine visit: Yes   Presenting Concerns: Patient and/or family reports the following symptoms/concerns: Ongoing anxiety and depressive home. Duration of problem: Months; Severity of problem: moderate  Patient and/or Family's Strengths/Protective Factors: Social and Emotional competence, Concrete supports in place (healthy food, safe environments, etc.), Physical Health (exercise, healthy diet, medication compliance, etc.), and Caregiver has knowledge of parenting & child development  Goals Addressed: Patient will:  Reduce symptoms of: anxiety and depression   Increase knowledge and/or ability of: coping skills, healthy habits, and self-management skills   Demonstrate ability to: Increase healthy  adjustment to current life circumstances and Increase adequate support systems for patient/family  Progress towards Goals: Ongoing   Interventions: Interventions utilized:  Mindfulness or Management consultant, Supportive Counseling, Psychoeducation and/or Health Education, and Supportive Reflection Standardized Assessments completed: Not Needed   Patient and/or Family Response: Patient was present for today's virtual session and reported being the mother of three children, ages 8, 62, and 48. She processed unresolved childhood trauma, acknowledging that although she believed she had moved past it, she continues to experience intrusive thoughts related to those events. Patient shared a history of prior therapy and diagnoses of depression, anxiety, schizophrenia, and PTSD. She stated she is not interested in medication at this time, citing past experiences of limited benefit. Patient reported ongoing feelings of loneliness and overwhelm, describing a long-standing pattern of having to manage responsibilities independently since childhood. During the session, she identified activities that promote a sense of ease and engaged in discussion about self-care and its emotional benefits.   Clinical Assessment/Diagnosis  Anxiety and depression    Assessment: Patient currently experiencing emotional distress related to unresolved childhood trauma, feelings of loneliness, and ongoing overwhelm associated with long-term caregiving and self-reliance..   Patient may benefit from continued support of behavioral health services.  Plan: Follow up with behavioral health clinician on : 07/31/2024 Behavioral recommendations: Recommended continued therapy to process trauma and develop coping strategies for emotional regulation and self-compassion. Encourage consistent engagement in self-care practices and exploration of supportive social connections to reduce isolation. Referral(s): Integrated [facility] (In Clinic)  I discussed the assessment and treatment plan with the patient and/or parent/guardian. They were provided an opportunity to ask questions and all were answered. They agreed with the plan and demonstrated an understanding of the instructions.   They were advised to call back or seek an in-person evaluation if the symptoms worsen or if the  condition fails to improve as anticipated.  Kristin Powers, LCSWA

## 2024-07-31 ENCOUNTER — Encounter: Payer: Self-pay | Admitting: Licensed Clinical Social Worker

## 2024-08-07 ENCOUNTER — Ambulatory Visit: Payer: Self-pay | Admitting: Licensed Clinical Social Worker

## 2024-08-07 ENCOUNTER — Encounter: Payer: Self-pay | Admitting: Licensed Clinical Social Worker

## 2024-08-07 DIAGNOSIS — F32A Depression, unspecified: Secondary | ICD-10-CM

## 2024-08-07 DIAGNOSIS — F419 Anxiety disorder, unspecified: Secondary | ICD-10-CM

## 2024-08-07 NOTE — BH Specialist Note (Signed)
 Integrated Behavioral Health via Telemedicine Visit  08/13/2024 Kristin Powers [id number]  Number of Integrated Behavioral Health Clinician visits: 2- Second Visit  Session Start time: 1615   Session End time: 1712  Total time in minutes: 57    Referring Provider: Abigail, MD Powers/Family location: At home [facility] Provider location: Remote Office All persons participating in visit: Powers and [name] Types of Service: Individual psychotherapy and Video visit  I connected with Kristin Powers and/or Kristin Powers via  Telephone or Engineer, Civil (consulting)  (Video is Surveyor, mining) and verified that I am speaking with the correct person using two identifiers. Discussed confidentiality: Yes   I discussed the limitations of telemedicine and the availability of in person appointments.  Discussed there is a possibility of technology failure and discussed alternative modes of communication if that failure occurs.  I discussed that engaging in this telemedicine visit, they consent to the provision of behavioral healthcare and the services will be billed under their insurance.  Powers and/or legal guardian expressed understanding and consented to Telemedicine visit: Yes   Presenting Concerns: Powers and/or family reports the following symptoms/concerns: Ongoing stressors, conflict with manager on the job Duration of problem: Months; Severity of problem: moderate  Powers and/or Family's Strengths/Protective Factors: Social and Emotional competence, Concrete supports in place (healthy food, safe environments, etc.), Physical Health (exercise, healthy diet, medication compliance, etc.), Caregiver has knowledge of parenting & child development, and Parental Resilience  Goals Addressed: Powers will:  Reduce symptoms of: anxiety and depression   Increase knowledge and/or ability of: coping skills, healthy habits, and self-management skills   Demonstrate  ability to: Increase healthy adjustment to current life circumstances and Increase adequate support systems for Powers/family  Progress towards Goals: Ongoing    Interventions: Interventions utilized:  Mindfulness or Management Consultant, Supportive Counseling, Psychoeducation and/or Health Education, Communication Skills, and Supportive Reflection Standardized Assessments completed: Not Needed    Powers and/or Family Response: Powers was present for today's virtual session. She reported feeling agitated, emotional, and sad, with increased emotional intensity correlated with the onset of her menstrual cycle. Initially, she attributed these mood changes to financial stressors related to the beginning of the month but later recognized a pattern consistent with premenstrual mood fluctuations ("cycle blues"). Aside from these symptoms, the Powers reported overall mental stability and improved emotional regulation. Psychoeducation was provided on cycle syncing, and the Powers expressed interest in further exploring this strategy. She also discussed increased conflict at work, noting that this has contributed to heightened emotional distress. The Powers demonstrated understanding of conflict resolution skills, and together we explored effective communication strategies for addressing concerns with her supervisor.  Clinical Assessment/Diagnosis  Anxiety and depression    Assessment: Powers currently experiencing increased emotional sensitivity, agitation, and sadness associated with her menstrual cycle, as well as heightened stress due to ongoing workplace conflict. Despite these challenges, she reports overall mental stability and improved ability to manage her emotions..   Powers may benefit from continued integrated behavioral health services.  Plan: Follow up with behavioral health clinician on : 08/14/2024 Behavioral recommendations: Powers to continue monitoring mood changes related  to her menstrual cycle and practice cycle syncing strategies to support emotional regulation. Additionally, she should apply conflict resolution and communication skills at work to reduce stress and maintain emotional stability. Referral(s): Integrated [employer] (In Clinic)  I discussed the assessment and treatment plan with the Powers and/or parent/guardian. They were provided an opportunity to ask questions and all  were answered. They agreed with the plan and demonstrated an understanding of the instructions.   They were advised to call back or seek an in-person evaluation if the symptoms worsen or if the condition fails to improve as anticipated.  Pavan Bring LITTIE Seats, LCSWA

## 2024-08-08 ENCOUNTER — Encounter: Payer: Self-pay | Admitting: Licensed Clinical Social Worker

## 2024-08-14 ENCOUNTER — Ambulatory Visit (INDEPENDENT_AMBULATORY_CARE_PROVIDER_SITE_OTHER): Payer: Self-pay | Admitting: Licensed Clinical Social Worker

## 2024-08-14 DIAGNOSIS — F32A Depression, unspecified: Secondary | ICD-10-CM | POA: Diagnosis not present

## 2024-08-14 DIAGNOSIS — F419 Anxiety disorder, unspecified: Secondary | ICD-10-CM | POA: Diagnosis not present

## 2024-08-21 NOTE — BH Specialist Note (Signed)
 Integrated Behavioral Health via Telemedicine Visit  08/21/2024 Dyanne A Leblond [id number]  Number of Integrated Behavioral Health Clinician visits: 3- Third Visit  Session Start time: 1552   Session End time: 1629  Total time in minutes: 37    Referring Provider: Abigail, MD Patient/Family location: At home [facility] Provider location: Remote office All persons participating in visit: Patient and [facility] Types of Service: Individual psychotherapy and Video visit  I connected with Egan A Heck and/or Herta A Templeton's patient via  Telephone or Engineer, Civil (consulting)  (Video is Surveyor, mining) and verified that I am speaking with the correct person using two identifiers. Discussed confidentiality: Yes   I discussed the limitations of telemedicine and the availability of in person appointments.  Discussed there is a possibility of technology failure and discussed alternative modes of communication if that failure occurs.  I discussed that engaging in this telemedicine visit, they consent to the provision of behavioral healthcare and the services will be billed under their insurance.  Patient and/or legal guardian expressed understanding and consented to Telemedicine visit: Yes   Presenting Concerns: Patient and/or family reports the following symptoms/concerns: Improvements with mood Duration of problem: Months; Severity of problem: moderate  Patient and/or Family's Strengths/Protective Factors: Social and Emotional competence, Sense of purpose, and Caregiver has knowledge of parenting & child development  Goals Addressed: Patient will:  Reduce symptoms of: anxiety and depression   Increase knowledge and/or ability of: coping skills, healthy habits, and self-management skills   Demonstrate ability to: Increase healthy adjustment to current life circumstances and Increase adequate support systems for patient/family  Progress towards  Goals: Ongoing    Interventions: Interventions utilized:  Solution-Focused Strategies, Supportive Counseling, Psychoeducation and/or Health Education, and Supportive Reflection Standardized Assessments completed: Not Needed    Patient and/or Family Response: Patient attended today's virtual session and reported overall improvement in mood, anxiety, and depressive symptoms this week. She stated that work has been going well and described progress in assertive communication, noting she has begun speaking up to her supervisor when feeling disrespected, which has resulted in increased confidence and emotional relief. Patient also shared excitement about starting school soon and expressed motivation to set future career goals to enhance her ability to support herself and her children.  Clinical Assessment/Diagnosis  Anxiety and depression    Assessment: Patient currently experiencing  improved mood, reduced anxiety, and greater confidence in managing depressive symptoms. She reports feeling more empowered at work and optimistic about upcoming life changes, including starting school..   Patient may benefit from continued support of integrated behavioral health services Plan: Follow up with behavioral health clinician on : 08/22/2024 Behavioral recommendations: include continuing to practice assertive communication and maintaining routines that support emotional stability and confidence. She is also encouraged to sustain goal-directed activities, such as preparing for school, to reinforce her progress and sense of empowerment. Referral(s): Integrated [employer] (In Clinic)  I discussed the assessment and treatment plan with the patient and/or parent/guardian. They were provided an opportunity to ask questions and all were answered. They agreed with the plan and demonstrated an understanding of the instructions.   They were advised to call back or seek an in-person evaluation if  the symptoms worsen or if the condition fails to improve as anticipated.  Jaycelyn Orrison LITTIE Seats, LCSWA

## 2024-08-22 ENCOUNTER — Ambulatory Visit (INDEPENDENT_AMBULATORY_CARE_PROVIDER_SITE_OTHER): Payer: Self-pay | Admitting: Licensed Clinical Social Worker

## 2024-08-22 DIAGNOSIS — F419 Anxiety disorder, unspecified: Secondary | ICD-10-CM

## 2024-08-22 DIAGNOSIS — F32A Depression, unspecified: Secondary | ICD-10-CM
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
## 2024-09-05 ENCOUNTER — Encounter: Payer: Self-pay | Admitting: Licensed Clinical Social Worker

## 2024-09-12 ENCOUNTER — Ambulatory Visit (INDEPENDENT_AMBULATORY_CARE_PROVIDER_SITE_OTHER): Payer: Self-pay | Admitting: Licensed Clinical Social Worker

## 2024-09-12 DIAGNOSIS — F32A Depression, unspecified: Secondary | ICD-10-CM

## 2024-09-12 DIAGNOSIS — F419 Anxiety disorder, unspecified: Secondary | ICD-10-CM

## 2024-09-12 NOTE — BH Specialist Note (Unsigned)
 Integrated Behavioral Health via Telemedicine Visit  09/17/2024 Verginia A Varghese [id number]  Number of Integrated Behavioral Health Clinician visits: 5-Fifth Visit  Session Start time: 1615   Session End time: 1700  Total time in minutes: 45    Referring Provider: Abigail, MD Patient/Family location: At home [facility] Provider location: Remote Office All persons participating in visit: Patient and [facility] Types of Service: Individual psychotherapy and Video visit  I connected with Margaretta A Schepers and/or Raigan A Bergman's patient via  Telephone or Engineer, Civil (consulting)  (Video is Surveyor, mining) and verified that I am speaking with the correct person using two identifiers. Discussed confidentiality: Yes   I discussed the limitations of telemedicine and the availability of in person appointments.  Discussed there is a possibility of technology failure and discussed alternative modes of communication if that failure occurs.  I discussed that engaging in this telemedicine visit, they consent to the provision of behavioral healthcare and the services will be billed under their insurance.  Patient and/or legal guardian expressed understanding and consented to Telemedicine visit: Yes   Presenting Concerns: Patient and/or family reports the following symptoms/concerns: ongoing depression and anxiety symptoms.  Duration of problem: Months; Severity of problem: moderate  Patient and/or Family's Strengths/Protective Factors: Social connections, Social and Emotional competence, Concrete supports in place (healthy food, safe environments, etc.), Physical Health (exercise, healthy diet, medication compliance, etc.), and Caregiver has knowledge of parenting & child development  Goals Addressed: Patient will:  Reduce symptoms of: anxiety and depression   Increase knowledge and/or ability of: coping skills, healthy habits, and self-management skills   Demonstrate ability to:  Increase healthy adjustment to current life circumstances and Increase adequate support systems for patient/family  Progress towards Goals: Ongoing    Interventions: Interventions utilized:  Mindfulness or Management Consultant, Supportive Counseling, Psychoeducation and/or Health Education, Communication Skills, and Supportive Reflection Standardized Assessments completed: Not Needed   Patient and/or Family Response: The patient was present for todays virtual session and actively engaged throughout. She processed increased feelings of comfort and confidence related to addressing her management when she feels disrespected. The patient also explored social difficulties, particularly her tendency to end phone conversations when there are pauses, as she interprets silence as a sign that she is boring or that the other person is uninterested. She reported ongoing feelings of social awkwardness and difficulty forming new friendships. Toward the end of the session, the patient participated in practicing positive affirmations, acknowledging this as an area for growth, as she rarely engages in positive self-talk.     Clinical Assessment/Diagnosis  Anxiety and depression    Assessment: Patient currently experiencing heightened self-doubt in social interactions and demonstrates negative self-perception that contributes to avoidance behaviors and decreased social confidence..   Patient may benefit from continued support of integrated behavioral health services..  Plan: Follow up with behavioral health clinician on : 09/24/2024 Behavioral recommendations: patient to continue working on cognitive restructuring to challenge negative assumptions in social situations. She is also encouraged to practice daily positive affirmations and gradually tolerate pauses in conversations to build confidence and improve social engagement. Referral(s): Integrated [employer] (In Clinic)  I discussed  the assessment and treatment plan with the patient and/or parent/guardian. They were provided an opportunity to ask questions and all were answered. They agreed with the plan and demonstrated an understanding of the instructions.   They were advised to call back or seek an in-person evaluation if the symptoms worsen or if the condition fails to  improve as anticipated.  Enya Bureau LITTIE Seats, LCSWA

## 2024-09-20 ENCOUNTER — Telehealth: Admitting: Family Medicine

## 2024-09-20 NOTE — Progress Notes (Signed)
 Error Needs to speak to PCP or GYN about norethindrone use.

## 2024-09-24 ENCOUNTER — Encounter: Payer: Self-pay | Admitting: Licensed Clinical Social Worker

## 2024-10-03 ENCOUNTER — Ambulatory Visit (INDEPENDENT_AMBULATORY_CARE_PROVIDER_SITE_OTHER): Payer: Self-pay | Admitting: Licensed Clinical Social Worker

## 2024-10-03 DIAGNOSIS — F419 Anxiety disorder, unspecified: Secondary | ICD-10-CM | POA: Diagnosis not present

## 2024-10-03 DIAGNOSIS — F32A Depression, unspecified: Secondary | ICD-10-CM

## 2024-10-03 NOTE — BH Specialist Note (Signed)
 "   Integrated Behavioral Health via Telemedicine Visit  10/09/2024 Kristin Powers [id number]  Number of Integrated Behavioral Health Clinician visits: 6-Sixth Visit  Session Start time: 1545   Session End time: 1626  Total time in minutes: 41    Referring Provider: Abigail, MD Patient/Family location: Home [facility] Provider location: Remote Office All persons participating in visit: Patient and [facility] Types of Service: Individual psychotherapy and Video visit  I connected with Kristin Powers and/or Kristin Powers's patient via  Telephone or Engineer, Civil (consulting)  (Video is Surveyor, mining) and verified that I am speaking with the correct person using two identifiers. Discussed confidentiality: Yes   I discussed the limitations of telemedicine and the availability of in person appointments.  Discussed there is a possibility of technology failure and discussed alternative modes of communication if that failure occurs.  I discussed that engaging in this telemedicine visit, they consent to the provision of behavioral healthcare and the services will be billed under their insurance.  Patient and/or legal guardian expressed understanding and consented to Telemedicine visit: Yes   Presenting Concerns: Patient and/or family reports the following symptoms/concerns: ongoing depressive symptoms.  Duration of problem: Months; Severity of problem: moderate  Patient and/or Family's Strengths/Protective Factors: Social connections, Social and Emotional competence, Concrete supports in place (healthy food, safe environments, etc.), and Sense of purpose  Goals Addressed: Patient will:  Reduce symptoms of: anxiety and depression   Increase knowledge and/or ability of: coping skills, healthy habits, and self-management skills   Demonstrate ability to: Increase healthy adjustment to current life circumstances and Increase adequate support systems for patient/family  Progress  towards Goals: Ongoing    Interventions: Interventions utilized:  Mindfulness or Management Consultant, Supportive Counseling, Psychoeducation and/or Health Education, Communication Skills, and Supportive Reflection Standardized Assessments completed: Not Needed    Patient and/or Family Response: The patient was present for todays virtual session. She reported having a positive Christmas experience and expressed excitement about the upcoming year, identifying anticipated goals including obtaining a new car, securing new employment, and purchasing a new home. During the session, the patient processed ongoing feelings of loneliness and limited social supports, which she reported have contributed to increased depressive symptoms. She described perceiving others as actively enjoying their lives while she feels confined to a routine focused on work, household responsibilities, and caring for her children. The patient was able to explore personal goals for 2026, including increasing social engagement, spending more time with friends, and pursuing dating opportunities. She identified several strategies to enhance her dating life, such as increasing social outings, attending church, and utilizing dating applications.   Clinical Assessment/Diagnosis  Anxiety and depression    Assessment: Patient currently experiencing feelings of loneliness and limited social support, which are contributing to increased depressive symptoms. She reports feeling stuck in a routine centered on responsibilities while perceiving others as more socially engaged and fulfilled..   Patient may benefit from continued support of integrated behavioral health services.  Plan: Follow up with behavioral health clinician on : 10/12/2023 Behavioral recommendations: Recommendations include increasing social engagement to reduce feelings of isolation by spending more time with friends and participating in community activities. The  patient is encouraged to explore dating opportunities through social outings, church involvement, and dating applications to support emotional well-being. Referral(s): Integrated [employer] (In Clinic)  I discussed the assessment and treatment plan with the patient and/or parent/guardian. They were provided an opportunity to ask questions and all were answered. They agreed  with the plan and demonstrated an understanding of the instructions.   They were advised to call back or seek an in-person evaluation if the symptoms worsen or if the condition fails to improve as anticipated.  Brainard Highfill LITTIE Seats, LCSWA "

## 2024-10-11 ENCOUNTER — Ambulatory Visit: Payer: Self-pay | Admitting: Licensed Clinical Social Worker

## 2024-10-11 DIAGNOSIS — F419 Anxiety disorder, unspecified: Secondary | ICD-10-CM

## 2024-10-11 DIAGNOSIS — F32A Depression, unspecified: Secondary | ICD-10-CM

## 2024-10-11 NOTE — BH Specialist Note (Unsigned)
"     Integrated Behavioral Health via Telemedicine Visit  10/11/2024 Kristin Powers [id number]  Number of Integrated Behavioral Health Clinician visits: 6-Sixth Visit  Session Start time: 1545   Session End time: 1626  Total time in minutes: 41    Referring Provider: *** Patient/Family location: [facility] Main Provider location: *** All persons participating in visit: *** Types of Service: {CHL AMB TYPE OF SERVICE:[phone]}  I connected with Kristin Powers and/or Kristin Powers's {family members:20773} via  Telephone or Engineer, Civil (consulting)  (Video is Caregility application) and verified that I am speaking with the correct person using two identifiers. Discussed confidentiality: {YES/NO:21197}  I discussed the limitations of telemedicine and the availability of in person appointments.  Discussed there is a possibility of technology failure and discussed alternative modes of communication if that failure occurs.  I discussed that engaging in this telemedicine visit, they consent to the provision of behavioral healthcare and the services will be billed under their insurance.  Patient and/or legal guardian expressed understanding and consented to Telemedicine visit: {YES/NO:21197}  Presenting Concerns: Patient and/or family reports the following symptoms/concerns: *** Duration of problem: ***; Severity of problem: {Mild/Moderate/Severe:20260}  Patient and/or Family's Strengths/Protective Factors: {CHL AMB BH PROTECTIVE FACTORS:[phone]}  Goals Addressed: Patient will:  Reduce symptoms of: {IBH Symptoms:21014056}   Increase knowledge and/or ability of: {IBH Patient Tools:21014057}   Demonstrate ability to: {IBH Goals:21014053}  Progress towards Goals: {CHL AMB BH PROGRESS TOWARDS [phone]}    Interventions: Interventions utilized:  {IBH Interventions:21014054} Standardized Assessments completed: {IBH Screening Tools:21014051}    Patient and/or  Family Response: got a work boo that 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 Found out she was pregnant and now he saying he is the god baby.   She found out that that was talking last week and this hurt her feeling. Felt tired that people   Clinical Assessment/Diagnosis  No diagnosis found.    Assessment: Patient currently experiencing ***.   Patient may benefit from ***.  Plan: Follow up with behavioral health clinician on : *** Behavioral recommendations: *** Referral(s): {IBH Referrals:21014055}  I discussed the assessment and treatment plan with the patient and/or parent/guardian. They were provided an opportunity to ask questions and all were answered. They agreed with the plan and demonstrated an understanding of the instructions.   They were advised to call back or seek an in-person evaluation if the symptoms worsen or if the condition fails to improve as anticipated.  Kristin Powers, LCSWA "

## 2024-10-15 ENCOUNTER — Ambulatory Visit: Admitting: Family Medicine

## 2024-10-15 ENCOUNTER — Encounter: Payer: Self-pay | Admitting: Family Medicine

## 2024-10-15 VITALS — BP 114/76 | HR 83 | Ht 63.0 in | Wt 168.8 lb

## 2024-10-15 DIAGNOSIS — M25532 Pain in left wrist: Secondary | ICD-10-CM | POA: Diagnosis not present

## 2024-10-15 DIAGNOSIS — M79676 Pain in unspecified toe(s): Secondary | ICD-10-CM | POA: Diagnosis not present

## 2024-10-15 DIAGNOSIS — M8560 Other cyst of bone, unspecified site: Secondary | ICD-10-CM | POA: Diagnosis not present

## 2024-10-15 DIAGNOSIS — M722 Plantar fascial fibromatosis: Secondary | ICD-10-CM | POA: Diagnosis not present

## 2024-10-15 DIAGNOSIS — N949 Unspecified condition associated with female genital organs and menstrual cycle: Secondary | ICD-10-CM | POA: Diagnosis not present

## 2024-10-15 DIAGNOSIS — M5432 Sciatica, left side: Secondary | ICD-10-CM | POA: Diagnosis not present

## 2024-10-15 MED ORDER — GABAPENTIN 300 MG PO CAPS: 300.0000 mg | ORAL_CAPSULE | Freq: Two times a day (BID) | ORAL | 1 refills | Status: AC

## 2024-10-15 MED ORDER — CYCLOBENZAPRINE HCL 10 MG PO TABS: 10.0000 mg | ORAL_TABLET | Freq: Three times a day (TID) | ORAL | 0 refills | Status: AC | PRN

## 2024-10-15 MED ORDER — METRONIDAZOLE 0.75 % VA GEL: 1.0000 | Freq: Every day | VAGINAL | 0 refills | Status: AC

## 2024-10-15 MED ORDER — MELOXICAM 15 MG PO TABS: 15.0000 mg | ORAL_TABLET | Freq: Every day | ORAL | 0 refills | Status: AC
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
## 2024-10-15 NOTE — Patient Instructions (Signed)
 Please go get your xrays done at [facility]. You do not need to make an appointment. You can just show up.   Address: [address] [place], [place], [place] [address]

## 2024-10-18 ENCOUNTER — Ambulatory Visit: Payer: Self-pay | Admitting: Licensed Clinical Social Worker

## 2024-10-18 DIAGNOSIS — F32A Depression, unspecified: Secondary | ICD-10-CM

## 2024-10-18 DIAGNOSIS — F419 Anxiety disorder, unspecified: Secondary | ICD-10-CM | POA: Diagnosis not present

## 2024-10-18 NOTE — BH Specialist Note (Signed)
 "   Integrated Behavioral Health via Telemedicine Visit  10/25/2024 Kristin Powers [id number]  Number of Integrated Behavioral Health Clinician visits: Additional Visit  Session Start time: 1530   Session End time: 1625  Total time in minutes: 55    Referring Provider: Abigail, MD Patient/Family location: Home [facility] Provider location: Work Office All persons participating in visit: Patient and Kristin Powers Types of Service: Individual psychotherapy and Video visit  I connected with Kristin Powers and/or Kristin Powers's patient via  Telephone or Engineer, Civil (consulting)  (Video is Surveyor, mining) and verified that I am speaking with the correct person using two identifiers. Discussed confidentiality: Yes   I discussed the limitations of telemedicine and the availability of in person appointments.  Discussed there is a possibility of technology failure and discussed alternative modes of communication if that failure occurs.  I discussed that engaging in this telemedicine visit, they consent to the provision of behavioral healthcare and the services will be billed under their insurance.  Patient and/or legal guardian expressed understanding and consented to Telemedicine visit: Yes   Presenting Concerns: Patient and/or family reports the following symptoms/concerns: Ongoing anxiety and depressive symptoms.  Duration of problem: Months; Severity of problem: moderate  Patient and/or Family's Strengths/Protective Factors: Social and Emotional competence, Concrete supports in place (healthy food, safe environments, etc.), Sense of purpose, Physical Health (exercise, healthy diet, medication compliance, etc.), and Caregiver has knowledge of parenting & child development  Goals Addressed: Patient will:  Reduce symptoms of: anxiety, depression, and mood instability   Increase knowledge and/or ability of: coping skills, healthy habits, and self-management skills   Demonstrate  ability to: Increase healthy adjustment to current life circumstances and Increase adequate support systems for patient/family  Progress towards Goals: Ongoing    Interventions: Interventions utilized:  Mindfulness or Management Consultant, Supportive Counseling, Psychoeducation and/or Health Education, Communication Skills, and Supportive Reflection Standardized Assessments completed: Not Needed    Patient and/or Family Response: Patient attended todays virtual session. She processed feelings related to excitement about a recent job interview at [employer], initially reporting that the interview went well. She then described becoming quickly irritated by the managers communication style, perceiving it as dismissive and reminiscent of similar experiences at her current job. Patient expressed frustration with feeling talked down to by management and emphasized her belief that she is a capable, hardworking employee who does not require micromanagement. Patient also discussed a recent conversation with a female friend who denied impregnating a coworker and expressed interest in pursuing a relationship with her; Patient reported that she is no longer interested in pursuing involvement with him. She remains focused on self development and career advancement.   Clinical Assessment/Diagnosis  Anxiety and depression    Assessment: Patient currently experiencing frustration and irritability related to perceived disrespect and micromanagement by supervisors, alongside mixed emotions about employment transitions. She is also navigating clarity and detachment regarding an interpersonal relationship, reporting decreased interest despite renewed attention from a female friend..   Patient may benefit from continued support of integrated behavioral health services.  Plan: Follow up with behavioral health clinician on : 10/25/2024 Behavioral recommendations: Patient is recommended to support emotional  regulation and exploration of triggers related to authority figures, with focus on assertive communication and boundary setting in the workplace. Encourage the client to practice coping strategies to manage irritability and to reflect on relationship goals to guide interpersonal decision-making. Referral(s): Integrated [employer] (In Clinic)  I discussed the assessment and treatment  plan with the patient and/or parent/guardian. They were provided an opportunity to ask questions and all were answered. They agreed with the plan and demonstrated an understanding of the instructions.   They were advised to call back or seek an in-person evaluation if the symptoms worsen or if the condition fails to improve as anticipated.  Kenderick Kobler LITTIE Seats, LCSWA "

## 2024-10-25 ENCOUNTER — Ambulatory Visit (INDEPENDENT_AMBULATORY_CARE_PROVIDER_SITE_OTHER): Payer: Self-pay | Admitting: Licensed Clinical Social Worker

## 2024-10-25 DIAGNOSIS — F419 Anxiety disorder, unspecified: Secondary | ICD-10-CM

## 2024-10-25 DIAGNOSIS — F32A Depression, unspecified: Secondary | ICD-10-CM

## 2024-10-25 NOTE — BH Specialist Note (Signed)
 "   Integrated Behavioral Health via Telemedicine Visit  11/01/2024 Kristin Powers [id number]  Number of Integrated Behavioral Health Clinician visits: Additional Visit  Session Start time: 1515   Session End time: 1615  Total time in minutes: 60    Referring Provider: Abigail, MD Patient/Family location: Home [facility] Provider location: Remote Office All persons participating in visit: Patient and [facility] Types of Service: Individual psychotherapy and Video visit  I connected with Kristin Powers and/or Kristin Powers's patient via  Telephone or Engineer, Civil (consulting)  (Video is Surveyor, mining) and verified that I am speaking with the correct person using two identifiers. Discussed confidentiality: Yes   I discussed the limitations of telemedicine and the availability of in person appointments.  Discussed there is a possibility of technology failure and discussed alternative modes of communication if that failure occurs.  I discussed that engaging in this telemedicine visit, they consent to the provision of behavioral healthcare and the services will be billed under their insurance.  Patient and/or legal guardian expressed understanding and consented to Telemedicine visit: Yes   Presenting Concerns: Patient and/or family reports the following symptoms/concerns: Ongoing work stress. Improvements with goal setting.  Duration of problem: Months; Severity of problem: moderate  Patient and/or Family's Strengths/Protective Factors: Social and Emotional competence, Concrete supports in place (healthy food, safe environments, etc.), Physical Health (exercise, healthy diet, medication compliance, etc.), and Caregiver has knowledge of parenting & child development  Goals Addressed: Patient will:  Reduce symptoms of: anxiety and depression   Increase knowledge and/or ability of: coping skills, healthy habits, and self-management skills   Demonstrate ability to: Increase  healthy adjustment to current life circumstances and Increase adequate support systems for patient/family  Progress towards Goals: Ongoing    Interventions: Interventions utilized:  Mindfulness or Management Consultant, Supportive Counseling, Psychoeducation and/or Health Education, Communication Skills, and Supportive Reflection Standardized Assessments completed: Not Needed    Patient and/or Family Response:Patient was present for todays virtual appointment and processed ongoing occupational stressors, reporting feeling burned out and at capacity due to continued issues with management at her current job at [employer]. She expressed a desire to resign but acknowledged the need to secure alternative employment prior to quitting. Patient reported submitting a job application to [employer] and actively following up, and plans to contact [employer] regarding an open position. Patient also shared she does not feel emotionally ready to date at this time, citing self-perceived flaws and a desire to focus on personal growth; she identified a self-imposed one-year period to work on herself before re-entering the dating space. During session, patient engaged in goal-setting and collaborated on identifying both short-term and long-term goals to support stability and self-improvement.   Clinical Assessment/Diagnosis  Anxiety and depression    Assessment: Patient currently experiencing significant work-related stress and emotional burnout, contributing to feelings of exhaustion and reduced capacity. She is also intentionally focusing on self-reflection and personal growth rather than romantic relationships at this time..   Patient may benefit from continued support of integrated behavioral health services.  Plan: Follow up with behavioral health clinician on : 11/07/2024 Behavioral recommendations: recommendations include continued goal-setting to support employment transitions and reduce  occupational stress. Patient is encouraged to practice self-compassion during her period of personal growth and to utilize coping strategies to manage burnout while pursuing healthier work and life balance. Referral(s): Integrated [employer] (In Clinic)  I discussed the assessment and treatment plan with the patient and/or parent/guardian. They were provided  an opportunity to ask questions and all were answered. They agreed with the plan and demonstrated an understanding of the instructions.   They were advised to call back or seek an in-person evaluation if the symptoms worsen or if the condition fails to improve as anticipated.  Kristin Powers, LCSWA "

## 2024-10-29 ENCOUNTER — Ambulatory Visit: Admitting: Podiatry

## 2024-10-31 ENCOUNTER — Ambulatory Visit: Admitting: Podiatry

## 2024-11-05 ENCOUNTER — Ambulatory Visit: Admitting: Podiatry

## 2024-11-06 ENCOUNTER — Ambulatory Visit: Admitting: Podiatry

## 2024-11-07 ENCOUNTER — Ambulatory Visit: Payer: Self-pay | Admitting: Licensed Clinical Social Worker

## 2024-11-07 NOTE — BH Specialist Note (Unsigned)
"     Integrated Behavioral Health via Telemedicine Visit  11/07/2024 Kristin Powers [id number]  Number of Integrated Behavioral Health Clinician visits: Additional Visit  Session Start time: 1515   Session End time: 1615  Total time in minutes: 60    Referring Provider: *** Patient/Family location: [facility] Provider location: *** All persons participating in visit: *** Types of Service: {CHL AMB TYPE OF SERVICE:[phone]}  I connected with Kristin Powers and/or Kristin Powers's {family members:20773} via  Telephone or Engineer, Civil (consulting)  (Video is Caregility application) and verified that I am speaking with the correct person using two identifiers. Discussed confidentiality: {YES/NO:21197}  I discussed the limitations of telemedicine and the availability of in person appointments.  Discussed there is a possibility of technology failure and discussed alternative modes of communication if that failure occurs.  I discussed that engaging in this telemedicine visit, they consent to the provision of behavioral healthcare and the services will be billed under their insurance.  Patient and/or legal guardian expressed understanding and consented to Telemedicine visit: {YES/NO:21197}  Presenting Concerns: Patient and/or family reports the following symptoms/concerns: *** Duration of problem: ***; Severity of problem: {Mild/Moderate/Severe:20260}  Patient and/or Family's Strengths/Protective Factors: {CHL AMB BH PROTECTIVE FACTORS:[phone]}  Goals Addressed: Patient will:  Reduce symptoms of: {IBH Symptoms:21014056}   Increase knowledge and/or ability of: {IBH Patient Tools:21014057}   Demonstrate ability to: {IBH Goals:21014053}  Progress towards Goals: {CHL AMB BH PROGRESS TOWARDS [phone]}    Interventions: Interventions utilized:  {IBH Interventions:21014054} Standardized Assessments completed: {IBH Screening Tools:21014051}    Patient and/or  Family Response: ***  Clinical Assessment/Diagnosis  No diagnosis found.    Assessment: Patient currently experiencing ***.   Patient may benefit from ***.  Plan: Follow up with behavioral health clinician on : *** Behavioral recommendations: *** Referral(s): {IBH Referrals:21014055}  I discussed the assessment and treatment plan with the patient and/or parent/guardian. They were provided an opportunity to ask questions and all were answered. They agreed with the plan and demonstrated an understanding of the instructions.   They were advised to call back or seek an in-person evaluation if the symptoms worsen or if the condition fails to improve as anticipated.  Kristin Powers, LCSWA "

## 2024-11-08 ENCOUNTER — Ambulatory Visit: Admitting: Podiatry

## 2024-11-08 ENCOUNTER — Encounter: Payer: Self-pay | Admitting: Obstetrics and Gynecology

## 2024-11-08 ENCOUNTER — Encounter: Payer: Self-pay | Admitting: Family Medicine

## 2024-11-08 ENCOUNTER — Ambulatory Visit: Admitting: Family Medicine

## 2024-11-08 ENCOUNTER — Ambulatory Visit: Payer: Self-pay | Admitting: Obstetrics and Gynecology

## 2024-11-08 VITALS — BP 124/79 | HR 77 | Ht 63.0 in | Wt 172.0 lb

## 2024-11-08 VITALS — BP 118/84 | Ht 62.5 in | Wt 170.0 lb

## 2024-11-08 DIAGNOSIS — M2141 Flat foot [pes planus] (acquired), right foot: Secondary | ICD-10-CM

## 2024-11-08 DIAGNOSIS — M2142 Flat foot [pes planus] (acquired), left foot: Secondary | ICD-10-CM

## 2024-11-08 DIAGNOSIS — R269 Unspecified abnormalities of gait and mobility: Secondary | ICD-10-CM

## 2024-11-08 DIAGNOSIS — M722 Plantar fascial fibromatosis: Secondary | ICD-10-CM

## 2024-11-08 DIAGNOSIS — Z30011 Encounter for initial prescription of contraceptive pills: Secondary | ICD-10-CM

## 2024-11-08 MED ORDER — NORETHINDRONE ACET-ETHINYL EST 1.5-30 MG-MCG PO TABS: 1.0000 | ORAL_TABLET | Freq: Every day | ORAL | 4 refills | Status: AC

## 2024-11-08 NOTE — Progress Notes (Signed)
" ° °  ESTABLISHED GYNECOLOGY VISIT Chief Complaint  Patient presents with   Options to delay period    Subjective:  Kristin Powers is a 34 y.o. [phone] presenting for options to delay her period.  She reports regular monthly periods but wants to delay them so that they don't come at inopportune times.  She has read about [facility] norethindrone  5 mg that you can take to delay your period.  Review of Systems:   Pertinent items are noted in HPI  Pertinent History Reviewed:  Reviewed past medical,surgical, social and family history.  Reviewed problem list, medications and allergies.  Objective:   Vitals:   11/08/24 0953  BP: 124/79  Pulse: 77  Weight: 172 lb (78 kg)  Height: 5' 3 (1.6 m)   Physical Examination:   General appearance - well appearing, and in no distress  Mental status - alert, oriented to person, place, and time  Psych:  normal mood and affect  Skin - warm and dry, normal color, no suspicious lesions noted    Assessment and Plan:  1. Encounter for initial prescription of contraceptive pills (Primary) Counseled patient on manipulating her period in this way with norethindrone  may cause irregular periods, breakthrough bleeding, etc. We discussed alternative would be continuous OCP where she can skip her periods and also have contraception. We reviewed that continuous OCP use also has a risk of breakthrough bleeding but is overall well tolerated. Denies history of hypertension, venous thromboembolism, migraines with aura, liver disease. She is interested in OCP. Rx given. - Norethindrone  Acetate-Ethinyl Estradiol (LOESTRIN) 1.5-30 MG-MCG tablet; Take 1 tablet by mouth daily. Take one pill by mouth daily continuously. When you finish the pack, start the next one without any placebo days  Dispense: 84 tablet; Refill: 4   Future Appointments  Date Time Provider Department Center  11/08/2024  3:00 PM Christine Rush, [place] [facility]  11/08/2024  3:30 PM Teressa Rainell BROCKS,  DO [facility]  11/14/2024  3:15 PM Sherrine Marcelyn CROME, LCSWA [facility] None  11/21/2024  3:15 PM Sherrine, [place] L, LCSWA [facility] None    Rollo ONEIDA Bring, MD, FACOG Obstetrician & Gynecologist, [facility] for [facility], [facility]  "

## 2024-11-08 NOTE — Progress Notes (Signed)
 Pt c/o regular periods. Wants medication to delay periods.

## 2024-11-08 NOTE — Patient Instructions (Signed)
 Thank you for coming to see me today. It was a pleasure.   We made you new orthotics. Let us know if we need to add any extra cushioning or make changes.  If you have any questions or concerns, please do not hesitate to call the office at [phone].

## 2024-11-08 NOTE — Progress Notes (Signed)
 DATE OF VISIT: 11/08/2024        Lashia A Bissonnette DOB: 02/05/1991 MRN: [id number]  CC:  custom orthotics  History of present Illness: Paulyne A Mast is a 34 y.o. female who presents for custom orthotics. Referred by Dr. 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   gabapentin  (NEURONTIN ) 300 MG capsule Take 1 capsule (300 mg total) by mouth 2 (two) times daily.   ibuprofen  (ADVIL ) 800 MG tablet Take 1 tablet (800 mg total) by mouth every 6 (six) hours as needed.   cyclobenzaprine  (FLEXERIL ) 10 MG tablet Take 1 tablet (10 mg total) by mouth 3 (three) times daily as needed for muscle spasms. (Patient not taking: Reported on 11/08/2024)   ferrous sulfate  (FERROUSUL) 325 (65 FE) MG tablet Take 1 tablet (325 mg total) by mouth 2 (two) times daily. (Patient not taking: Reported on 11/08/2024)   meloxicam  (MOBIC ) 15 MG tablet Take 1 tablet (15 mg total) by mouth daily. (Patient not taking: Reported on 11/08/2024)   metroNIDAZOLE  (FLAGYL ) 500 MG tablet Take 1 tablet (500 mg total) by mouth 2 (two) times daily. (Patient not taking: Reported on 11/08/2024)   Norethindrone  Acetate-Ethinyl Estradiol (LOESTRIN) 1.5-30 MG-MCG tablet Take 1 tablet by mouth daily. Take one pill by mouth daily continuously. When you finish the pack, start the next one without any placebo days   prednisoLONE  acetate (PRED FORTE ) 1 % ophthalmic suspension Place 1 drop into the right eye every 2 (two) hours while awake. (Patient not taking: Reported on 11/08/2024)   triamcinolone  cream (KENALOG ) 0.1 % Apply topically 2 (two) times daily. (Patient not taking: Reported on 11/08/2024)    [DISCONTINUED] potassium chloride  SA (K-DUR,KLOR-CON ) 20 MEQ tablet Take 1 tablet (20 mEq total) by mouth 3 (three) times daily. (Patient not taking: Reported on 07/25/2016)   No facility-administered encounter medications on file as of 11/08/2024.    Allergies: has no known allergies.  Physical Examination: Vitals: BP 118/84   Ht 5' 2.5 (1.588 m)   Wt 170 lb (77.1 kg)   LMP 10/16/2024   BMI 30.60 kg/m  GENERAL:  Melissa A Olivero is a 34 y.o. female appearing their stated age, alert and oriented x 3, in no apparent distress.  SKIN: no rashes or lesions, skin clean, dry, intact MSK: Bilateral feet with pes planus and associated overpronation.  Does have widening of transverse arches bilaterally.  Early bunion formation bilaterally.  No significant tenderness along the metatarsals.  Does have some mild tenderness along the plantar fascia of the left foot. Gait: Bilateral overpronation Neuro vastly intact distally  Assessment & Plan  1. Plantar fasciitis 2. Bilateral pes planus 3. Gait abnormality Foot pain with associated pes planus, widening of transverse arches, gait abnormality with overpronation  Plan: - Is candidate for custom orthotics.  These were fabricated in the office as noted below.  Patient was fitted for a : [employer]n Run semi-rigid orthotic  The orthotic was heated, placed on the orthotic stand. The patient was positioned in subtalar neutral position and 10 degrees of ankle dorsiflexion in a weight bearing stance on the heated orthotic blank After completion of molding  Blank: Fit [employer]n Run - size 8 Posting:  bilateral small scaphoid pads Base: none -built in base  Orthotics were comfortable in the office and she had more neutral gait and alignment.  Patient was unable to take inserts out of current shoes, was advised to take inserts out of her work shoes and other shoes and just use the orthotics.  This should make them fit more comfortably.  Should continue to follow  with PCP as scheduled.  Can follow-up with us  on an as needed basis.    Patient expressed understanding & agreement with above.  No diagnosis found.  No orders of the defined types were placed in this encounter.

## 2024-11-14 ENCOUNTER — Encounter: Payer: Self-pay | Admitting: Licensed Clinical Social Worker

## 2024-11-21 ENCOUNTER — Encounter: Payer: Self-pay | Admitting: Licensed Clinical Social Worker
